# Patient Record
Sex: Male | Born: 1960
Health system: Southern US, Community
[De-identification: ages and names within clinical notes are randomized; demographics above are authoritative.]

## PROBLEM LIST (undated history)

## (undated) DIAGNOSIS — R51 Headache: Secondary | ICD-10-CM

## (undated) DIAGNOSIS — M329 Systemic lupus erythematosus, unspecified: Secondary | ICD-10-CM

## (undated) DIAGNOSIS — Z9889 Other specified postprocedural states: Secondary | ICD-10-CM

## (undated) DIAGNOSIS — R519 Headache, unspecified: Secondary | ICD-10-CM

## (undated) DIAGNOSIS — M359 Systemic involvement of connective tissue, unspecified: Secondary | ICD-10-CM

## (undated) DIAGNOSIS — T8203XA Leakage of heart valve prosthesis, initial encounter: Secondary | ICD-10-CM

## (undated) DIAGNOSIS — R5383 Other fatigue: Secondary | ICD-10-CM

## (undated) DIAGNOSIS — M1990 Unspecified osteoarthritis, unspecified site: Secondary | ICD-10-CM

## (undated) DIAGNOSIS — I350 Nonrheumatic aortic (valve) stenosis: Secondary | ICD-10-CM

## (undated) DIAGNOSIS — L409 Psoriasis, unspecified: Secondary | ICD-10-CM

## (undated) DIAGNOSIS — Z973 Presence of spectacles and contact lenses: Secondary | ICD-10-CM

## (undated) DIAGNOSIS — D649 Anemia, unspecified: Secondary | ICD-10-CM

## (undated) DIAGNOSIS — R112 Nausea with vomiting, unspecified: Secondary | ICD-10-CM

## (undated) HISTORY — DX: Systemic involvement of connective tissue, unspecified: M35.9

## (undated) HISTORY — PX: CARDIAC CATHETERIZATION: SHX172

---

## 2012-12-13 ENCOUNTER — Ambulatory Visit (INDEPENDENT_AMBULATORY_CARE_PROVIDER_SITE_OTHER): Payer: 59 | Admitting: General Surgery

## 2012-12-13 ENCOUNTER — Encounter (INDEPENDENT_AMBULATORY_CARE_PROVIDER_SITE_OTHER): Payer: Self-pay | Admitting: General Surgery

## 2012-12-13 VITALS — BP 152/90 | HR 64 | Temp 97.3°F | Resp 12 | Ht 68.0 in | Wt 168.0 lb

## 2012-12-13 DIAGNOSIS — K4090 Unilateral inguinal hernia, without obstruction or gangrene, not specified as recurrent: Secondary | ICD-10-CM

## 2012-12-13 NOTE — Progress Notes (Signed)
Patient ID: Luis Ferguson, male   DOB: 1961/06/09, 52 y.o.   MRN: [id number]  Chief Complaint  Patient presents with  . New Evaluation    eval RIH    HPI Luis Ferguson is a 52 y.o. male.  The patient is a 52 year old male who is referred by [place] fecal physicians for evaluation of a right inguinal hernia. The patient states that the hernias been there for approximately 2 years. He states he has a burning sensation when lifting heavy objects. [name] job entails working with [employer]. HPI  Past Medical History  Diagnosis Date  . Connective tissue disease     History reviewed. No pertinent past surgical history.  Family History  Problem Relation Age of Onset  . Diabetes Mother   . Cancer Brother     liver  . Diabetes Brother     Social History History  Substance Use Topics  . Smoking status: Never Smoker   . Smokeless tobacco: Never Used  . Alcohol Use: No    No Known Allergies  Current Outpatient Prescriptions  Medication Sig Dispense Refill  . aspirin 81 MG tablet Take 81 mg by mouth daily.      [name] atorvastatin (LIPITOR) 20 MG tablet       . Cholecalciferol (VITAMIN D PO) Take by mouth.      . fish oil-omega-3 fatty acids 1000 MG capsule Take 2 g by mouth daily.      . methylPREDNISolone (MEDROL) 4 MG tablet       . Multiple Vitamins-Minerals (ONE-A-DAY 50 PLUS PO) Take by mouth.      . tamsulosin (FLOMAX) 0.4 MG CAPS        No current facility-administered medications for this visit.    Review of Systems Review of Systems  Constitutional: Negative.   HENT: Negative.   Respiratory: Negative.   Cardiovascular: Negative.   Gastrointestinal: Negative.   Neurological: Negative.     Blood pressure 152/90, pulse 64, temperature 97.3 F (36.3 C), temperature source Temporal, resp. rate 12, height 5\' 8"  (1.727 m), weight 168 lb (76.204 kg).  Physical Exam Physical Exam  Constitutional: He is oriented to person, place, and time. He appears  well-developed and well-nourished.  HENT:  Head: Normocephalic and atraumatic.  Eyes: Conjunctivae and EOM are normal. Pupils are equal, round, and reactive to light.  Neck: Normal range of motion. Neck supple.  Cardiovascular: Normal rate, regular rhythm and normal heart sounds.   Pulmonary/Chest: Effort normal and breath sounds normal.  Abdominal: Soft. Bowel sounds are normal. A hernia is present. Hernia confirmed positive in the right inguinal area.  Questionable left inguinal hernia  Musculoskeletal: Normal range of motion.  Neurological: He is alert and oriented to person, place, and time.    Data Reviewed none  Assessment    52 year old male with a right direct inguinal hernia, and questionable left direct inguinal hernia,     Plan    1. Will proceed to the operating room for laparoscopic right inguinal hernia repair, & explore the left hernia area.  2. I discussed the procedure in length, and give the patient literature on the procedure. All risks and benefits were discussed with the patient, to generally include infection, bleeding, damage to surrounding structures, acute and chronic nerve pain, and recurrence. Alternatives were offered and described.  All questions were answered and the patient voiced understanding of the procedure and wishes to proceed at this point.        Marigene Ehlers.,  Karry Barrilleaux 12/13/2012, 2:44 PM

## 2012-12-16 ENCOUNTER — Ambulatory Visit (INDEPENDENT_AMBULATORY_CARE_PROVIDER_SITE_OTHER): Payer: Self-pay | Admitting: General Surgery

## 2013-02-17 DIAGNOSIS — K4020 Bilateral inguinal hernia, without obstruction or gangrene, not specified as recurrent: Secondary | ICD-10-CM

## 2013-02-20 ENCOUNTER — Encounter (INDEPENDENT_AMBULATORY_CARE_PROVIDER_SITE_OTHER): Payer: Self-pay | Admitting: General Surgery

## 2013-03-07 ENCOUNTER — Encounter (INDEPENDENT_AMBULATORY_CARE_PROVIDER_SITE_OTHER): Payer: Self-pay | Admitting: General Surgery

## 2013-03-07 ENCOUNTER — Ambulatory Visit (INDEPENDENT_AMBULATORY_CARE_PROVIDER_SITE_OTHER): Payer: 59 | Admitting: General Surgery

## 2013-03-07 VITALS — BP 130/82 | HR 66 | Temp 98.1°F | Resp 14 | Ht 68.0 in | Wt 168.2 lb

## 2013-03-07 DIAGNOSIS — Z8719 Personal history of other diseases of the digestive system: Secondary | ICD-10-CM

## 2013-03-07 DIAGNOSIS — Z9889 Other specified postprocedural states: Secondary | ICD-10-CM

## 2013-03-07 NOTE — Progress Notes (Signed)
Patient ID: Luis Ferguson, male   DOB: Nov 30, 1960, 52 y.o.   MRN: [id number] The patient is a 52 year old male status post bilateral inguinal hernia repair, laparoscopically, status post 2 weeks. Patient has been doing well and is returned to work. He's had no pain and no symptoms at this time.  On exam: Wounds are clean dry and intact there is no hernia on palpation bilaterally  Assessment and plan: This 52 year old male status post lap bilateral inguinal hernia repair with mesh 1. The patient can continue this time with any aerobic activities. We discussed no heavy lifting for another 4 weeks. 2. Patient follow up as needed

## 2014-07-27 HISTORY — PX: HERNIA REPAIR: SHX51

## 2014-08-04 ENCOUNTER — Ambulatory Visit: Payer: Self-pay

## 2015-01-09 ENCOUNTER — Ambulatory Visit (HOSPITAL_COMMUNITY): Payer: 59 | Attending: Cardiology

## 2015-01-09 ENCOUNTER — Other Ambulatory Visit: Payer: Self-pay | Admitting: Physician Assistant

## 2015-01-09 ENCOUNTER — Other Ambulatory Visit: Payer: Self-pay

## 2015-01-09 DIAGNOSIS — I350 Nonrheumatic aortic (valve) stenosis: Secondary | ICD-10-CM | POA: Diagnosis not present

## 2015-01-09 DIAGNOSIS — R011 Cardiac murmur, unspecified: Secondary | ICD-10-CM | POA: Diagnosis not present

## 2015-01-09 DIAGNOSIS — I358 Other nonrheumatic aortic valve disorders: Secondary | ICD-10-CM | POA: Diagnosis not present

## 2015-01-09 DIAGNOSIS — I351 Nonrheumatic aortic (valve) insufficiency: Secondary | ICD-10-CM | POA: Diagnosis not present

## 2015-01-09 DIAGNOSIS — I340 Nonrheumatic mitral (valve) insufficiency: Secondary | ICD-10-CM | POA: Diagnosis not present

## 2015-01-14 ENCOUNTER — Telehealth: Payer: Self-pay | Admitting: Internal Medicine

## 2015-01-14 NOTE — Telephone Encounter (Signed)
Received records from [facility] for appointment on 01/21/15 with Dr Hilty.  Records given to N Hines (medical records) for Dr Hilty's schedule on 01/21/15. lp °

## 2015-01-21 ENCOUNTER — Encounter: Payer: Self-pay | Admitting: Internal Medicine

## 2015-01-21 ENCOUNTER — Ambulatory Visit (INDEPENDENT_AMBULATORY_CARE_PROVIDER_SITE_OTHER): Payer: 59 | Admitting: Internal Medicine

## 2015-01-21 VITALS — BP 160/92 | HR 81 | Ht 67.5 in | Wt 168.0 lb

## 2015-01-21 DIAGNOSIS — I350 Nonrheumatic aortic (valve) stenosis: Secondary | ICD-10-CM | POA: Diagnosis not present

## 2015-01-21 DIAGNOSIS — Q231 Congenital insufficiency of aortic valve: Secondary | ICD-10-CM

## 2015-01-21 DIAGNOSIS — M329 Systemic lupus erythematosus, unspecified: Secondary | ICD-10-CM | POA: Diagnosis not present

## 2015-01-21 DIAGNOSIS — E785 Hyperlipidemia, unspecified: Secondary | ICD-10-CM | POA: Diagnosis not present

## 2015-01-21 NOTE — Patient Instructions (Signed)
Your physician wants you to follow-up in: 6 months with Dr. Hilty. You will receive a reminder letter in the mail two months in advance. If you don't receive a letter, please call our office to schedule the follow-up appointment.    

## 2015-01-22 DIAGNOSIS — I350 Nonrheumatic aortic (valve) stenosis: Secondary | ICD-10-CM | POA: Insufficient documentation

## 2015-01-22 DIAGNOSIS — M329 Systemic lupus erythematosus, unspecified: Secondary | ICD-10-CM | POA: Insufficient documentation

## 2015-01-22 DIAGNOSIS — Q231 Congenital insufficiency of aortic valve: Secondary | ICD-10-CM | POA: Insufficient documentation

## 2015-01-22 DIAGNOSIS — E785 Hyperlipidemia, unspecified: Secondary | ICD-10-CM | POA: Insufficient documentation

## 2015-01-22 NOTE — Progress Notes (Signed)
OFFICE NOTE  Chief Complaint:  Abnormal echocardiogram, murmur  Primary Care Physician: REDMON,NOELLE, PA-C  HPI:  Luis Ferguson is a pleasant 54 year old male who is coming referred to me for an aortic murmur. He underwent an echocardiogram recently which demonstrated an EF of 55-60%, moderate calcific aortic valve stenosis without mention of a bicuspid valve or not, and moderate regurgitation. He apparently was told he had a heart murmur when he was child but this was not followed up on. He denies any chest pain or worsening shortness of breath. He does have a history of dyslipidemia is well and was diagnosed and is on treatment for lupus. There is no family history of congenital heart disease to his knowledge.  PMHx:  Past Medical History  Diagnosis Date  . Connective tissue disease     Past Surgical History  Procedure Laterality Date  . Hernia repair      left and right    FAMHx:  Family History  Problem Relation Age of Onset  . Diabetes Mother     also HTN  . Cancer Brother     liver  . Diabetes Brother   . CAD Father 50    CABGx3    SOCHx:   reports that he has never smoked. He has never used smokeless tobacco. He reports that he does not drink alcohol or use illicit drugs.  ALLERGIES:  Allergies  Allergen Reactions  . Codeine Itching and Rash    ROS: A comprehensive review of systems was negative.  HOME MEDS: Current Outpatient Prescriptions  Medication Sig Dispense Refill  . aspirin 81 MG tablet Take 81 mg by mouth daily.    [name] atorvastatin (LIPITOR) 20 MG tablet Take 20 mg by mouth daily.     . cholecalciferol (VITAMIN D) 1000 UNITS tablet Take 1,000 Units by mouth daily.    . fish oil-omega-3 fatty acids 1000 MG capsule Take 2 g by mouth daily.    . hydroxychloroquine (PLAQUENIL) 200 MG tablet Take 400 mg by mouth daily.     . Multiple Vitamins-Minerals (ONE-A-DAY 50 PLUS PO) Take by mouth.    . tamsulosin (FLOMAX) 0.4 MG CAPS Take 0.4 mg by  mouth daily.      No current facility-administered medications for this visit.    LABS/IMAGING: No results found for this or any previous visit (from the past 48 hour(s)). No results found.  WEIGHTS: Wt Readings from Last 3 Encounters:  01/21/15 168 lb (76.204 kg)  03/07/13 168 lb 3.2 oz (76.295 kg)  12/13/12 168 lb (76.204 kg)    VITALS: BP 160/92 mmHg  Pulse 81  Ht 5' 7.5" (1.715 m)  Wt 168 lb (76.204 kg)  BMI 25.91 kg/m2  EXAM: General appearance: alert and no distress Neck: no carotid bruit, no JVD and thyroid not enlarged, symmetric, no tenderness/mass/nodules Lungs: clear to auscultation bilaterally Heart: regular rate and rhythm, S1, S2 normal and systolic murmur: Mid peaking 3/6, blowing at apex Abdomen: soft, non-tender; bowel sounds normal; no masses,  no organomegaly Extremities: extremities normal, atraumatic, no cyanosis or edema Pulses: 2+ and symmetric Skin: Skin color, texture, turgor normal. No rashes or lesions Neurologic: Grossly normal Psych: Pleasant  EKG: Normal sinus rhythm at 81  ASSESSMENT: 1. Moderate calcific aortic valve stenosis, a bicuspid valve cannot be ruled out 2. Moderate aortic insufficiency 3. Dyslipidemia 4. Lupus/MCTD  PLAN: 1.   Mr. Zechman most likely has a bicuspid aortic valve which is calcified. Given his young age it is  likely that this valve will need to be replaced within the next few years. He will need close follow-up. At this time there is no clear indication for intervention. He is completely asymptomatic. His EKG is normal at rest. He is on good medical therapy including a cholesterol medication. I recommend follow-up in 6 months. We will get another echocardiogram in 6 months to one year to look for stability of his aortic valve. We had an extensive discussion in the office about aortic stenosis and the possibility of a bicuspid valve. At some point, would be helpful to get a CT of the aorta as bicuspid valves are  associated with aortic coarctation about 20% of the time.  Thank you for the kind referral. I'm happy to follow him going forward.  Chrystie Nose, MD, [facility] Attending Cardiologist [facility]  Lisette Abu [facility] 01/22/2015, 6:42 PM

## 2015-01-24 ENCOUNTER — Encounter: Payer: Self-pay | Admitting: *Deleted

## 2015-07-15 ENCOUNTER — Encounter: Payer: Self-pay | Admitting: Internal Medicine

## 2015-07-15 ENCOUNTER — Ambulatory Visit (INDEPENDENT_AMBULATORY_CARE_PROVIDER_SITE_OTHER): Payer: Commercial Managed Care - PPO | Admitting: Internal Medicine

## 2015-07-15 VITALS — BP 158/90 | HR 73 | Ht 66.0 in | Wt 166.0 lb

## 2015-07-15 DIAGNOSIS — Q231 Congenital insufficiency of aortic valve: Secondary | ICD-10-CM | POA: Diagnosis not present

## 2015-07-15 DIAGNOSIS — I350 Nonrheumatic aortic (valve) stenosis: Secondary | ICD-10-CM

## 2015-07-15 DIAGNOSIS — E785 Hyperlipidemia, unspecified: Secondary | ICD-10-CM

## 2015-07-15 NOTE — Progress Notes (Signed)
OFFICE NOTE  Chief Complaint:  Abnormal echocardiogram, murmur  Primary Care Physician: REDMON,NOELLE, PA-C  HPI:  Luis Ferguson is a pleasant 54 year old male who is coming referred to me for an aortic murmur. He underwent an echocardiogram recently which demonstrated an EF of 55-60%, moderate calcific aortic valve stenosis without mention of a bicuspid valve or not, and moderate regurgitation. He apparently was told he had a heart murmur when he was child but this was not followed up on. He denies any chest pain or worsening shortness of breath. He does have a history of dyslipidemia is well and was diagnosed and is on treatment for lupus. There is no family history of congenital heart disease to his knowledge.  Luis Ferguson returns today for follow-up. Overall he remains asymptomatic. He denies any chest pain or worsening shortness of breath.  PMHx:  Past Medical History  Diagnosis Date  . Connective tissue disease [facility])     Past Surgical History  Procedure Laterality Date  . Hernia repair      left and right    FAMHx:  Family History  Problem Relation Age of Onset  . Diabetes Mother     also HTN  . Liver cancer Brother   . Diabetes Brother   . CAD Father 46    CABGx3    SOCHx:   reports that he has never smoked. He has never used smokeless tobacco. He reports that he does not drink alcohol or use illicit drugs.  ALLERGIES:  Allergies  Allergen Reactions  . Codeine Itching and Rash    ROS: A comprehensive review of systems was negative.  HOME MEDS: Current Outpatient Prescriptions  Medication Sig Dispense Refill  . aspirin 81 MG tablet Take 81 mg by mouth daily.    [name] atorvastatin (LIPITOR) 20 MG tablet Take 20 mg by mouth daily.     . cholecalciferol (VITAMIN D) 1000 UNITS tablet Take 1,000 Units by mouth daily.    . fish oil-omega-3 fatty acids 1000 MG capsule Take 2 g by mouth daily.    . hydroxychloroquine (PLAQUENIL) 200 MG tablet Take 400 mg by  mouth daily.     . Multiple Vitamins-Minerals (ONE-A-DAY 50 PLUS PO) Take by mouth.    . tamsulosin (FLOMAX) 0.4 MG CAPS Take 0.4 mg by mouth daily.      No current facility-administered medications for this visit.    LABS/IMAGING: No results found for this or any previous visit (from the past 48 hour(s)). No results found.  WEIGHTS: Wt Readings from Last 3 Encounters:  07/15/15 166 lb (75.297 kg)  01/21/15 168 lb (76.204 kg)  03/07/13 168 lb 3.2 oz (76.295 kg)    VITALS: BP 158/90 mmHg  Pulse 73  Ht  (1.676 m)  Wt 166 lb (75.297 kg)  BMI 26.81 kg/m2  EXAM: General appearance: alert and no distress Neck: no carotid bruit, no JVD and thyroid not enlarged, symmetric, no tenderness/mass/nodules Lungs: clear to auscultation bilaterally Heart: regular rate and rhythm, S1, S2 normal and systolic murmur: Mid peaking 3/6, blowing at apex Abdomen: soft, non-tender; bowel sounds normal; no masses,  no organomegaly Extremities: extremities normal, atraumatic, no cyanosis or edema Pulses: 2+ and symmetric Skin: Skin color, texture, turgor normal. No rashes or lesions Neurologic: Grossly normal Psych: Pleasant  EKG: Normal sinus rhythm at 73  ASSESSMENT: 1. Moderate calcific aortic valve stenosis, a bicuspid valve cannot be ruled out 2. Moderate aortic insufficiency 3. Dyslipidemia 4. Lupus/MCTD  PLAN: 1.  Luis Ferguson has a stable murmur of moderate aortic stenosis and moderate aortic insufficiency. He will need close follow-up and a repeat echocardiogram in 6 months (a one-year study). I told them that there is a likely possibility his aortic valve may need to be replaced within the next 4-5 years. Fortunately he continues to be active and that will be helpful to identify his symptoms.   Plan to see him back in 6 months following his echo to go over the results.   Chrystie Nose, MD, [facility] Attending Cardiologist [facility]  Chrystie Nose 07/15/2015, 5:29  PM

## 2015-07-15 NOTE — Patient Instructions (Signed)
Your physician has requested that you have an echocardiogram in 6 months. Echocardiography is a painless test that uses sound waves to create images of your heart. It provides your doctor with information about the size and shape of your heart and how well your heart's chambers and valves are working. This procedure takes approximately one hour. There are no restrictions for this procedure.  Dr Rennis Golden recommends that you schedule a follow-up appointment in 6 months - after your echocardiogram. You will receive a reminder letter in the mail two months in advance. If you don't receive a letter, please call our office to schedule the follow-up appointment.  If you need a refill on your cardiac medications before your next appointment, please call your pharmacy.

## 2016-01-06 ENCOUNTER — Ambulatory Visit (HOSPITAL_COMMUNITY): Payer: Commercial Managed Care - PPO | Attending: Cardiovascular Disease

## 2016-01-06 ENCOUNTER — Other Ambulatory Visit: Payer: Self-pay

## 2016-01-06 DIAGNOSIS — Z8249 Family history of ischemic heart disease and other diseases of the circulatory system: Secondary | ICD-10-CM | POA: Diagnosis not present

## 2016-01-06 DIAGNOSIS — I350 Nonrheumatic aortic (valve) stenosis: Secondary | ICD-10-CM | POA: Diagnosis not present

## 2016-01-06 DIAGNOSIS — I517 Cardiomegaly: Secondary | ICD-10-CM | POA: Diagnosis not present

## 2016-01-06 DIAGNOSIS — E785 Hyperlipidemia, unspecified: Secondary | ICD-10-CM | POA: Diagnosis not present

## 2016-01-06 DIAGNOSIS — I352 Nonrheumatic aortic (valve) stenosis with insufficiency: Secondary | ICD-10-CM | POA: Diagnosis not present

## 2016-01-06 LAB — ECHOCARDIOGRAM COMPLETE
AO mean calculated velocity dopler: 219 cm/s
AOASC: 34 cm
AOPV: 0.34 m/s
AOVTI: 78.4 cm
AV Area VTI index: 0.79 cm2/m2
AV Area VTI: 1.42 cm2
AV Area mean vel: 1.5 cm2
AV Mean grad: 23 mmHg
AV Peak grad: 46 mmHg
AV VEL mean LVOT/AV: 0.36
AV peak Index: 0.75
AV pk vel: 338 cm/s
AV vel: 1.49
AVAREAMEANVIN: 0.79 cm2/m2
AVPHT: 640 ms
CHL CUP AV VALUE AREA INDEX: 0.79
CHL CUP DOP CALC LVOT VTI: 28.1 cm
E decel time: 173 msec
EERAT: 6.57
FS: 36 % (ref 28–44)
IVS/LV PW RATIO, ED: 1.15
LA diam end sys: 46 mm
LA vol: 41.3 mL
LADIAMINDEX: 2.44 cm/m2
LASIZE: 46 mm
LAVOLA4C: 39.7 mL
LAVOLIN: 21.9 mL/m2
LV E/e'average: 6.57
LV PW d: 12 mm — AB (ref 0.6–1.1)
LVEEMED: 6.57
LVELAT: 14.1 cm/s
LVOT SV: 117 mL
LVOT area: 4.15 cm2
LVOT diameter: 23 mm
LVOT peak VTI: 0.36 cm
LVOT peak grad rest: 5 mmHg
LVOTPV: 116 cm/s
MV Dec: 173
MV Peak grad: 3 mmHg
MV pk A vel: 77.6 m/s
MV pk E vel: 92.6 m/s
Reg peak vel: 235 cm/s
TAPSE: 24.8 mm
TDI e' lateral: 14.1
TDI e' medial: 9.79
TRMAXVEL: 235 cm/s
Valve area: 1.49 cm2

## 2016-01-08 ENCOUNTER — Ambulatory Visit (INDEPENDENT_AMBULATORY_CARE_PROVIDER_SITE_OTHER): Payer: Commercial Managed Care - PPO | Admitting: Internal Medicine

## 2016-01-08 ENCOUNTER — Encounter: Payer: Self-pay | Admitting: Internal Medicine

## 2016-01-08 VITALS — BP 141/91 | HR 69 | Ht 66.5 in | Wt 162.2 lb

## 2016-01-08 DIAGNOSIS — I350 Nonrheumatic aortic (valve) stenosis: Secondary | ICD-10-CM

## 2016-01-08 DIAGNOSIS — Q231 Congenital insufficiency of aortic valve: Secondary | ICD-10-CM | POA: Diagnosis not present

## 2016-01-08 DIAGNOSIS — E785 Hyperlipidemia, unspecified: Secondary | ICD-10-CM | POA: Diagnosis not present

## 2016-01-08 NOTE — Patient Instructions (Signed)
Your physician has requested that you have an echocardiogram @ [address] in 1 year. Echocardiography is a painless test that uses sound waves to create images of your heart. It provides your doctor with information about the size and shape of your heart and how well your heart's chambers and valves are working. This procedure takes approximately one hour. There are no restrictions for this procedure.  Your physician wants you to follow-up in: 12 months with Dr. Rennis Golden (after echo) You will receive a reminder letter in the mail two months in advance. If you don't receive a letter, please call our office to schedule the follow-up appointment.

## 2016-01-08 NOTE — Progress Notes (Signed)
OFFICE NOTE  Chief Complaint:  Abnormal echocardiogram, murmur  Primary Care Physician: Ferguson,NOELLE, PA-C  HPI:  Luis Ferguson is a pleasant 55 year old male who is coming referred to me for an aortic murmur. He underwent an echocardiogram recently which demonstrated an EF of 55-60%, moderate calcific aortic valve stenosis without mention of a bicuspid valve or not, and moderate regurgitation. He apparently was told he had a heart murmur when he was child but this was not followed up on. He denies any chest pain or worsening shortness of breath. He does have a history of dyslipidemia is well and was diagnosed and is on treatment for lupus. There is no family history of congenital heart disease to his knowledge.  01/08/2016  Luis Ferguson returns today for follow-up. Overall he remains asymptomatic. He denies any chest pain or worsening shortness of breath. A repeat echocardiogram was performed which shows stable aortic stenosis and insufficiency. EF remains preserved at 55-60%. The left atrium was moderately dilated.  PMHx:  Past Medical History  Diagnosis Date  . Connective tissue disease [facility])     Past Surgical History  Procedure Laterality Date  . Hernia repair      left and right    FAMHx:  Family History  Problem Relation Age of Onset  . Diabetes Mother     also HTN  . Liver cancer Brother   . Diabetes Brother   . CAD Father 81    CABGx3    SOCHx:   reports that he has never smoked. He has never used smokeless tobacco. He reports that he does not drink alcohol or use illicit drugs.  ALLERGIES:  Allergies  Allergen Reactions  . Codeine Itching and Rash    ROS: A comprehensive review of systems was negative.  HOME MEDS: Current Outpatient Prescriptions  Medication Sig Dispense Refill  . aspirin 81 MG tablet Take 81 mg by mouth daily.    [name] atorvastatin (LIPITOR) 20 MG tablet Take 20 mg by mouth daily.     . cholecalciferol (VITAMIN D) 1000 UNITS tablet  Take 1,000 Units by mouth daily.    . fish oil-omega-3 fatty acids 1000 MG capsule Take 2 g by mouth daily.    . hydroxychloroquine (PLAQUENIL) 200 MG tablet Take 400 mg by mouth daily.     . Multiple Vitamins-Minerals (ONE-A-DAY 50 PLUS PO) Take by mouth.    . predniSONE (DELTASONE) 5 MG tablet Take 5 mg by mouth daily with breakfast.    . tamsulosin (FLOMAX) 0.4 MG CAPS Take 0.4 mg by mouth daily.      No current facility-administered medications for this visit.    LABS/IMAGING: No results found for this or any previous visit (from the past 48 hour(s)). No results found.  WEIGHTS: Wt Readings from Last 3 Encounters:  01/08/16 162 lb 3.2 oz (73.573 kg)  07/15/15 166 lb (75.297 kg)  01/21/15 168 lb (76.204 kg)    VITALS: BP 141/91 mmHg  Pulse 69  Ht 5' 6.5" (1.689 m)  Wt 162 lb 3.2 oz (73.573 kg)  BMI 25.79 kg/m2  EXAM: General appearance: alert and no distress Neck: no carotid bruit, no JVD and thyroid not enlarged, symmetric, no tenderness/mass/nodules Lungs: clear to auscultation bilaterally Heart: regular rate and rhythm, S1, S2 normal and systolic murmur: Mid peaking 3/6, blowing at apex Abdomen: soft, non-tender; bowel sounds normal; no masses,  no organomegaly Extremities: extremities normal, atraumatic, no cyanosis or edema Pulses: 2+ and symmetric Skin: Skin color, texture, turgor  normal. No rashes or lesions Neurologic: Grossly normal Psych: Pleasant  EKG: Normal sinus rhythm at 64  ASSESSMENT: 1. Moderate calcific aortic valve stenosis, a bicuspid valve cannot be ruled out 2. Moderate aortic insufficiency 3. Dyslipidemia 4. Lupus/MCTD  PLAN: 1.   Luis Ferguson has a stable murmur of moderate aortic stenosis and moderate aortic insufficiency. This appears to be stable by echo over the past year. Based on those findings of think we can safely see him back in one year and continue to monitor him clinically. Blood pressure is mildly elevated today however  recheck came back at 130/82. He is on cholesterol medication and recently has had good control over his cholesterol.  Follow-up with me in one year or sooner as necessary.  Chrystie Nose, MD, [facility] Attending Cardiologist [facility]  Chrystie Nose 01/08/2016, 5:07 PM

## 2016-08-11 DIAGNOSIS — Z79899 Other long term (current) drug therapy: Secondary | ICD-10-CM | POA: Diagnosis not present

## 2016-08-11 DIAGNOSIS — H40013 Open angle with borderline findings, low risk, bilateral: Secondary | ICD-10-CM | POA: Diagnosis not present

## 2016-08-11 DIAGNOSIS — M329 Systemic lupus erythematosus, unspecified: Secondary | ICD-10-CM | POA: Diagnosis not present

## 2016-08-31 DIAGNOSIS — L400 Psoriasis vulgaris: Secondary | ICD-10-CM | POA: Diagnosis not present

## 2016-09-03 ENCOUNTER — Telehealth (HOSPITAL_COMMUNITY): Payer: Self-pay | Admitting: Radiology

## 2016-09-03 NOTE — Telephone Encounter (Signed)
called to schedule echo due in June

## 2016-10-12 DIAGNOSIS — L400 Psoriasis vulgaris: Secondary | ICD-10-CM | POA: Diagnosis not present

## 2016-12-04 DIAGNOSIS — I10 Essential (primary) hypertension: Secondary | ICD-10-CM | POA: Diagnosis not present

## 2016-12-04 DIAGNOSIS — M7021 Olecranon bursitis, right elbow: Secondary | ICD-10-CM | POA: Diagnosis not present

## 2016-12-06 DIAGNOSIS — M7021 Olecranon bursitis, right elbow: Secondary | ICD-10-CM | POA: Diagnosis not present

## 2016-12-06 DIAGNOSIS — I10 Essential (primary) hypertension: Secondary | ICD-10-CM | POA: Diagnosis not present

## 2016-12-07 ENCOUNTER — Telehealth (HOSPITAL_COMMUNITY): Payer: Self-pay | Admitting: Internal Medicine

## 2016-12-07 NOTE — Telephone Encounter (Signed)
New message      Calling to see if pt needs to have an echo this year.  There is no order in the computer and he is seeing Dr Rennis Golden 01-15-17. Please call

## 2016-12-07 NOTE — Telephone Encounter (Signed)
Due in June, please call pt and schedule appt

## 2017-01-05 DIAGNOSIS — E7800 Pure hypercholesterolemia, unspecified: Secondary | ICD-10-CM | POA: Diagnosis not present

## 2017-01-05 DIAGNOSIS — N400 Enlarged prostate without lower urinary tract symptoms: Secondary | ICD-10-CM | POA: Diagnosis not present

## 2017-01-11 ENCOUNTER — Encounter: Payer: Self-pay | Admitting: *Deleted

## 2017-01-11 ENCOUNTER — Ambulatory Visit (HOSPITAL_COMMUNITY): Payer: Commercial Managed Care - PPO | Attending: Cardiology

## 2017-01-11 DIAGNOSIS — I350 Nonrheumatic aortic (valve) stenosis: Secondary | ICD-10-CM | POA: Diagnosis not present

## 2017-01-11 DIAGNOSIS — I352 Nonrheumatic aortic (valve) stenosis with insufficiency: Secondary | ICD-10-CM | POA: Diagnosis not present

## 2017-01-11 DIAGNOSIS — I517 Cardiomegaly: Secondary | ICD-10-CM | POA: Diagnosis not present

## 2017-01-15 ENCOUNTER — Encounter: Payer: Self-pay | Admitting: Internal Medicine

## 2017-01-15 ENCOUNTER — Ambulatory Visit (INDEPENDENT_AMBULATORY_CARE_PROVIDER_SITE_OTHER): Payer: Commercial Managed Care - PPO | Admitting: Internal Medicine

## 2017-01-15 VITALS — BP 138/86 | HR 64 | Ht 66.0 in | Wt 162.8 lb

## 2017-01-15 DIAGNOSIS — Q231 Congenital insufficiency of aortic valve: Secondary | ICD-10-CM

## 2017-01-15 DIAGNOSIS — I351 Nonrheumatic aortic (valve) insufficiency: Secondary | ICD-10-CM | POA: Insufficient documentation

## 2017-01-15 DIAGNOSIS — I350 Nonrheumatic aortic (valve) stenosis: Secondary | ICD-10-CM | POA: Diagnosis not present

## 2017-01-15 NOTE — Patient Instructions (Signed)
Your physician wants you to follow-up in: November with Dr. Rennis Golden. You will receive a reminder letter in the mail two months in advance. If you don't receive a letter, please call our office to schedule the follow-up appointment.  Your physician has requested that you have an echocardiogram @ [address] in November 2018. Echocardiography is a painless test that uses sound waves to create images of your heart. It provides your doctor with information about the size and shape of your heart and how well your heart's chambers and valves are working. This procedure takes approximately one hour. There are no restrictions for this procedure.

## 2017-01-15 NOTE — Progress Notes (Signed)
OFFICE NOTE  Chief Complaint:  Abnormal echocardiogram, murmur  Primary Care Physician: Milus Height, PA-C  HPI:  Luis Ferguson is a pleasant 56 year old male who is coming referred to me for an aortic murmur. He underwent an echocardiogram recently which demonstrated an EF of 55-60%, moderate calcific aortic valve stenosis without mention of a bicuspid valve or not, and moderate regurgitation. He apparently was told he had a heart murmur when he was child but this was not followed up on. He denies any chest pain or worsening shortness of breath. He does have a history of dyslipidemia is well and was diagnosed and is on treatment for lupus. There is no family history of congenital heart disease to his knowledge.  01/08/2016  Luis Ferguson returns today for follow-up. Overall he remains asymptomatic. He denies any chest pain or worsening shortness of breath. A repeat echocardiogram was performed which shows stable aortic stenosis and insufficiency. EF remains preserved at 55-60%. The left atrium was moderately dilated.  01/15/2017  Luis Ferguson returns today for follow-up. He recently had a repeat echo which now shows severe aortic stenosis. The aortic valve severely calcified with restricted leaflet motion. There was moderate regurgitation. The mean gradient was 47 mmHg. Left atrium was mildly dilated and LVEF was normal. I discussed the findings with him today and he reports he is for the most part asymptomatic. He works about 10 hours a day and when he comes home and goes and mows the lawn. 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 He reports lifting and being very active without any chest pain, shortness of breath, presyncope, syncope or other associated symptoms. Although he meets criteria by the echo for valve replacement, since he is asymptomatic we could consider watchful waiting at this time.  PMHx:  Past Medical History:  Diagnosis Date  .  Connective tissue disease [facility])     Past Surgical History:  Procedure Laterality Date  . HERNIA REPAIR     left and right    FAMHx:  Family History  Problem Relation Age of Onset  . Diabetes Mother        also HTN  . Liver cancer Brother   . Diabetes Brother   . CAD Father 103       CABGx3    SOCHx:   reports that he has never smoked. He has never used smokeless tobacco. He reports that he does not drink alcohol or use drugs.  ALLERGIES:  Allergies  Allergen Reactions  . Codeine Itching and Rash    ROS: Pertinent items noted in HPI and remainder of comprehensive ROS otherwise negative.  HOME MEDS: Current Outpatient Prescriptions  Medication Sig Dispense Refill  . aspirin 81 MG tablet Take 81 mg by mouth daily.    [name] atorvastatin (LIPITOR) 20 MG tablet Take 20 mg by mouth daily.     . cholecalciferol (VITAMIN D) 1000 UNITS tablet Take 1,000 Units by mouth daily.    . fish oil-omega-3 fatty acids 1000 MG capsule Take 2 g by mouth daily.    . hydroxychloroquine (PLAQUENIL) 200 MG tablet Take 400 mg by mouth daily.     . Multiple Vitamins-Minerals (ONE-A-DAY 50 PLUS PO) Take by mouth.    . predniSONE (DELTASONE) 5 MG tablet Take 5 mg by mouth daily with breakfast.    . tamsulosin (FLOMAX) 0.4 MG CAPS Take 0.4 mg by mouth daily.      No current facility-administered medications for this  visit.     LABS/IMAGING: No results found for this or any previous visit (from the past 48 hour(s)). No results found.  WEIGHTS: Wt Readings from Last 3 Encounters:  01/15/17 162 lb 12.8 oz (73.8 kg)  01/08/16 162 lb 3.2 oz (73.6 kg)  07/15/15 166 lb (75.3 kg)    VITALS: BP 138/86   Pulse 64   Ht 5\' 6"  (1.676 m)   Wt 162 lb 12.8 oz (73.8 kg)   BMI 26.28 kg/m   EXAM: General appearance: alert and no distress Neck: no carotid bruit, no JVD and thyroid not enlarged, symmetric, no tenderness/mass/nodules Lungs: clear to auscultation bilaterally Heart: regular rate and  rhythm, S1: normal, S2: decreased intensity and systolic murmur: late systolic 3/6, crescendo at 2nd right intercostal space Abdomen: soft, non-tender; bowel sounds normal; no masses,  no organomegaly Extremities: extremities normal, atraumatic, no cyanosis or edema Pulses: 2+ and symmetric Skin: Skin color, texture, turgor normal. No rashes or lesions Neurologic: Grossly normal Psych: Pleasant  EKG: Normal sinus rhythm at 64, nonspecific T wave changes  ASSESSMENT: 1. Severe calcific aortic valve stenosis, suspect bicuspid valve 2. Moderate aortic insufficiency 3. Dyslipidemia 4. Lupus/MCTD  PLAN: 1.   Mr. Weatherbee now has severe calcific aortic valve stenosis and a probable bicuspid aortic valve. There is moderate aortic insufficiency. The mean gradients 47 mmHg. He reports being completely asymptomatic and works a very strenuous Set designer job, does yard work and other activities. He denies any worsening fatigue, shortness of breath, congestive heart failure symptoms, presyncope, syncope or chest pain. I've offered stress testing to try to evaluate whether he is symptomatic, or the other option would be to wait an additional 6 months and repeat echocardiogram. On exam today there is a very faint S2, however his murmur is quite loud, louder then it had been previously. I suspect he will need valve replacement within the next year.  He's advised to contact if he becomes symptomatic at any point. Otherwise we'll plan a repeat echocardiogram in November and follow-up with me afterwards.  Chrystie Nose, MD, [facility] Attending Cardiologist [facility]  Chrystie Nose 01/15/2017, 4:53 PM

## 2017-01-20 DIAGNOSIS — I7300 Raynaud's syndrome without gangrene: Secondary | ICD-10-CM | POA: Diagnosis not present

## 2017-01-20 DIAGNOSIS — M5432 Sciatica, left side: Secondary | ICD-10-CM | POA: Diagnosis not present

## 2017-01-20 DIAGNOSIS — M329 Systemic lupus erythematosus, unspecified: Secondary | ICD-10-CM | POA: Diagnosis not present

## 2017-02-03 DIAGNOSIS — M7021 Olecranon bursitis, right elbow: Secondary | ICD-10-CM | POA: Diagnosis not present

## 2017-02-03 DIAGNOSIS — B86 Scabies: Secondary | ICD-10-CM | POA: Diagnosis not present

## 2017-04-29 DIAGNOSIS — Z23 Encounter for immunization: Secondary | ICD-10-CM | POA: Diagnosis not present

## 2017-06-07 ENCOUNTER — Ambulatory Visit (HOSPITAL_COMMUNITY): Payer: Commercial Managed Care - PPO | Attending: Cardiology

## 2017-06-07 ENCOUNTER — Other Ambulatory Visit: Payer: Self-pay

## 2017-06-07 DIAGNOSIS — I5030 Unspecified diastolic (congestive) heart failure: Secondary | ICD-10-CM | POA: Insufficient documentation

## 2017-06-07 DIAGNOSIS — I420 Dilated cardiomyopathy: Secondary | ICD-10-CM | POA: Diagnosis not present

## 2017-06-07 DIAGNOSIS — I350 Nonrheumatic aortic (valve) stenosis: Secondary | ICD-10-CM | POA: Diagnosis not present

## 2017-06-07 DIAGNOSIS — I083 Combined rheumatic disorders of mitral, aortic and tricuspid valves: Secondary | ICD-10-CM | POA: Insufficient documentation

## 2017-06-08 ENCOUNTER — Telehealth: Payer: Self-pay | Admitting: Internal Medicine

## 2017-06-08 NOTE — Telephone Encounter (Signed)
F/y Message  Pt returning RN call

## 2017-06-08 NOTE — Telephone Encounter (Signed)
New Message     Pt c/o Shortness Of Breath: STAT if SOB developed within the last 24 hours or pt is noticeably SOB on the phone  1. Are you currently SOB (can you hear that pt is SOB on the phone)? no  2. How long have you been experiencing SOB?  A week or so   3. Are you SOB when sitting or when up moving around? Straining to breathe when he walks   4. Are you currently experiencing any other symptoms?  Fatigue and breathing hard

## 2017-06-08 NOTE — Telephone Encounter (Signed)
Spoke with patient and he just wanted his follow up with Dr Rennis Golden sooner than next week. Explained Dr Rennis Golden not back in the office until next week. rescheduled appointment on Tuesday 06/15/17 for a time that worked better for patient

## 2017-06-08 NOTE — Telephone Encounter (Signed)
[id number]

## 2017-06-15 ENCOUNTER — Ambulatory Visit: Payer: Commercial Managed Care - PPO | Admitting: Internal Medicine

## 2017-06-15 ENCOUNTER — Ambulatory Visit: Payer: Self-pay | Admitting: Internal Medicine

## 2017-06-15 ENCOUNTER — Encounter: Payer: Self-pay | Admitting: Internal Medicine

## 2017-06-15 VITALS — BP 178/90 | HR 70 | Ht 66.0 in | Wt 163.2 lb

## 2017-06-15 DIAGNOSIS — I351 Nonrheumatic aortic (valve) insufficiency: Secondary | ICD-10-CM | POA: Diagnosis not present

## 2017-06-15 DIAGNOSIS — Q231 Congenital insufficiency of aortic valve: Secondary | ICD-10-CM

## 2017-06-15 DIAGNOSIS — I350 Nonrheumatic aortic (valve) stenosis: Secondary | ICD-10-CM | POA: Diagnosis not present

## 2017-06-15 DIAGNOSIS — Z79899 Other long term (current) drug therapy: Secondary | ICD-10-CM | POA: Diagnosis not present

## 2017-06-15 DIAGNOSIS — Z01818 Encounter for other preprocedural examination: Secondary | ICD-10-CM

## 2017-06-15 NOTE — Patient Instructions (Signed)
Medication Instructions: Your physician recommends that you continue on your current medications as directed. Please refer to the Current Medication list given to you today.  If you need a refill on your cardiac medications before your next appointment, please call your pharmacy.    Follow-Up: Your physician wants you to follow-up in: January with Dr. Rennis Golden. You will receive a reminder letter in the mail two months in advance. If you don't receive a letter, please call our office at [phone] to schedule this follow-up appointment.   Thank you for choosing [facility] at [facility]!!       [facility] CARDIOVASCULAR DIVISION [facility] [address] [place] [address] Dept: [phone] Loc: [phone]  ZIYA VEREB  06/15/2017  You are scheduled for a Cardiac Catheterization on Wednesday, November 21 at 2:30 pm with Dr. Verdis Prime.  1. Please arrive at the [facility] (Main Entrance A) at [facility]: [address] [place], [place] [address] at 12:30 PM (two hours before your procedure to ensure your preparation). Free valet parking service is available.   Special note: Every effort is made to have your procedure done on time. Please understand that emergencies sometimes delay scheduled procedures.  2. Diet: Do not eat or drink anything after midnight prior to your procedure except sips of water to take medications.  3. Labs: You will have your labs drawn today at the [facility] office.  4. Medication instructions in preparation for your procedure: Take your medications according to you norm.   On the morning of your procedure, take your Aspirin and any morning medicines NOT listed above.  You may use sips of water.  5. Plan for one night stay--bring personal belongings. 6. Bring a current list of your medications and current insurance cards. 7. You MUST have a responsible person to drive you home. 8.  Someone MUST be with you the first 24 hours after you arrive home or your discharge will be delayed. 9. Please wear clothes that are easy to get on and off and wear slip-on shoes.  Thank you for allowing [place] to care for you!   -- [facility]

## 2017-06-15 NOTE — H&P (View-Only) (Signed)
OFFICE NOTE  Chief Complaint:  Follow-up echocardiogram  Primary Care Physician: Milus Height, PA-C  HPI:  Luis Ferguson is a pleasant 56 year old male who is coming referred to me for an aortic murmur. He underwent an echocardiogram recently which demonstrated an EF of 55-60%, moderate calcific aortic valve stenosis without mention of a bicuspid valve or not, and moderate regurgitation. He apparently was told he had a heart murmur when he was child but this was not followed up on. He denies any chest pain or worsening shortness of breath. He does have a history of dyslipidemia is well and was diagnosed and is on treatment for lupus. There is no family history of congenital heart disease to his knowledge.  01/08/2016  Luis Ferguson returns today for follow-up. Overall he remains asymptomatic. He denies any chest pain or worsening shortness of breath. A repeat echocardiogram was performed which shows stable aortic stenosis and insufficiency. EF remains preserved at 55-60%. The left atrium was moderately dilated.  01/15/2017  Luis Ferguson returns today for follow-up. He recently had a repeat echo which now shows severe aortic stenosis. The aortic valve severely calcified with restricted leaflet motion. There was moderate regurgitation. The mean gradient was 47 mmHg. Left atrium was mildly dilated and LVEF was normal. I discussed the findings with him today and he reports he is for the most part asymptomatic. He works about 10 hours a day and when he comes home and goes and mows the lawn. 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 He reports lifting and being very active without any chest pain, shortness of breath, presyncope, syncope or other associated symptoms. Although he meets criteria by the echo for valve replacement, since he is asymptomatic we could consider watchful waiting at this time.  06/15/2017  Luis Ferguson was seen today in follow-up.  He had  a repeat echocardiogram to follow-up on aortic stenosis.  This was noted to be severe with a mean gradient of 47 mmHg and moderate aortic insufficiency.  6 months of now past and I asked him to watch his symptoms very closely.  He now notes that he has become more short of breath and his wife particularly notes that he fatigues much easier.  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  We did repeat his echo which showed preserved LV function however he is aortic valve gradient is slightly worse, but overall similar to the findings 6 months ago.  Based on his new symptomatology, I am recommending aortic valve replacement.  PMHx:  Past Medical History:  Diagnosis Date  . Connective tissue disease [facility])     Past Surgical History:  Procedure Laterality Date  . HERNIA REPAIR     left and right    FAMHx:  Family History  Problem Relation Age of Onset  . Diabetes Mother        also HTN  . Liver cancer Brother   . Diabetes Brother   . CAD Father 59       CABGx3    SOCHx:   reports that  has never smoked. he has never used smokeless tobacco. He reports that he does not drink alcohol or use drugs.  ALLERGIES:  Allergies  Allergen Reactions  . Codeine Itching and Rash    ROS: Pertinent items noted in HPI and remainder of comprehensive ROS otherwise negative.  HOME MEDS: Current Outpatient  Medications  Medication Sig Dispense Refill  . aspirin 81 MG tablet Take 81 mg by mouth daily.    . atorvastatin (LIPITOR) 20 MG tablet Take 20 mg by mouth daily.     . cholecalciferol (VITAMIN D) 1000 UNITS tablet Take 1,000 Units by mouth daily.    . fish oil-omega-3 fatty acids 1000 MG capsule Take 2 g by mouth daily.    . hydroxychloroquine (PLAQUENIL) 200 MG tablet Take 400 mg by mouth daily.     . Multiple Vitamins-Minerals (ONE-A-DAY 50 PLUS PO) Take by mouth.    . tamsulosin (FLOMAX) 0.4 MG CAPS Take 0.4 mg by mouth daily.     .  predniSONE (DELTASONE) 5 MG tablet Take 5 mg by mouth daily with breakfast.     No current facility-administered medications for this visit.     LABS/IMAGING: No results found for this or any previous visit (from the past 48 hour(s)). No results found.  WEIGHTS: Wt Readings from Last 3 Encounters:  06/15/17 163 lb 3.2 oz (74 kg)  01/15/17 162 lb 12.8 oz (73.8 kg)  01/08/16 162 lb 3.2 oz (73.6 kg)    VITALS: BP (!) 178/90   Pulse 70   Ht 5' 6" (1.676 m)   Wt 163 lb 3.2 oz (74 kg)   BMI 26.34 kg/m   EXAM: General appearance: alert and no distress Neck: no carotid bruit, no JVD and thyroid not enlarged, symmetric, no tenderness/mass/nodules Lungs: clear to auscultation bilaterally Heart: regular rate and rhythm, S1: normal, S2: decreased intensity and systolic murmur: late systolic 3/6, crescendo at 2nd right intercostal space Abdomen: soft, non-tender; bowel sounds normal; no masses,  no organomegaly Extremities: extremities normal, atraumatic, no cyanosis or edema Pulses: 2+ and symmetric Skin: Skin color, texture, turgor normal. No rashes or lesions Neurologic: Grossly normal Psych: Pleasant  EKG: N sinus rhythm at 70, moderate voltage criteria for LVH, nonspecific T wave changes-personally reviewed  ASSESSMENT: 1. Severe symptomatic calcific aortic valve stenosis, suspect bicuspid valve 2. Moderate aortic insufficiency 3. Dyslipidemia 4. Lupus/MCTD  PLAN: 1.   Luis Ferguson has severe symptomatic calcific aortic valve stenosis and a possible bicuspid valve.  There is moderate aortic insufficiency and he is becoming more short of breath with exertion.  He was asymptomatic 6 months ago.  His LVEF is preserved.  At this point I would recommend preoperative workup and referral to cardiac surgery for aortic valve replacement.  He should be a fairly low risk surgical candidate and I expect he will likely get a traditional aortic valve replacement.  We have arranged for left and  right heart catheterization with Dr. Smith tomorrow and he will need a cardiac surgery consultation afterwards, I suspect with follow-up in the office to discuss aortic valve replacement.  Follow-up with me otherwise in 2 months.  Aryaman Haliburton C. Kerstin Crusoe, MD, FACC, FACP  [place]  [facility]  Medical Director of the Advanced Lipid Disorders &  Cardiovascular Risk Reduction Clinic Attending Cardiologist  Direct Dial: [phone]  Fax: [phone]  Website:  [email or web address]  Hersey Maclellan C Puja Caffey 06/15/2017, 5:27 PM 

## 2017-06-15 NOTE — Progress Notes (Signed)
OFFICE NOTE  Chief Complaint:  Follow-up echocardiogram  Primary Care Physician: Milus Height, PA-C  HPI:  Luis Ferguson is a pleasant 56 year old male who is coming referred to me for an aortic murmur. He underwent an echocardiogram recently which demonstrated an EF of 55-60%, moderate calcific aortic valve stenosis without mention of a bicuspid valve or not, and moderate regurgitation. He apparently was told he had a heart murmur when he was child but this was not followed up on. He denies any chest pain or worsening shortness of breath. He does have a history of dyslipidemia is well and was diagnosed and is on treatment for lupus. There is no family history of congenital heart disease to his knowledge.  01/08/2016  Luis Ferguson returns today for follow-up. Overall he remains asymptomatic. He denies any chest pain or worsening shortness of breath. A repeat echocardiogram was performed which shows stable aortic stenosis and insufficiency. EF remains preserved at 55-60%. The left atrium was moderately dilated.  01/15/2017  Luis Ferguson returns today for follow-up. He recently had a repeat echo which now shows severe aortic stenosis. The aortic valve severely calcified with restricted leaflet motion. There was moderate regurgitation. The mean gradient was 47 mmHg. Left atrium was mildly dilated and LVEF was normal. I discussed the findings with him today and he reports he is for the most part asymptomatic. He works about 10 hours a day and when he comes home and goes and mows the lawn. 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 He reports lifting and being very active without any chest pain, shortness of breath, presyncope, syncope or other associated symptoms. Although he meets criteria by the echo for valve replacement, since he is asymptomatic we could consider watchful waiting at this time.  06/15/2017  Luis Ferguson was seen today in follow-up.  He had  a repeat echocardiogram to follow-up on aortic stenosis.  This was noted to be severe with a mean gradient of 47 mmHg and moderate aortic insufficiency.  6 months of now past and I asked him to watch his symptoms very closely.  He now notes that he has become more short of breath and his wife particularly notes that he fatigues much easier.  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  We did repeat his echo which showed preserved LV function however he is aortic valve gradient is slightly worse, but overall similar to the findings 6 months ago.  Based on his new symptomatology, I am recommending aortic valve replacement.  PMHx:  Past Medical History:  Diagnosis Date  . Connective tissue disease [facility])     Past Surgical History:  Procedure Laterality Date  . HERNIA REPAIR     left and right    FAMHx:  Family History  Problem Relation Age of Onset  . Diabetes Mother        also HTN  . Liver cancer Brother   . Diabetes Brother   . CAD Father 59       CABGx3    SOCHx:   reports that  has never smoked. he has never used smokeless tobacco. He reports that he does not drink alcohol or use drugs.  ALLERGIES:  Allergies  Allergen Reactions  . Codeine Itching and Rash    ROS: Pertinent items noted in HPI and remainder of comprehensive ROS otherwise negative.  HOME MEDS: Current Outpatient  Medications  Medication Sig Dispense Refill  . aspirin 81 MG tablet Take 81 mg by mouth daily.    [name]. atorvastatin (LIPITOR) 20 MG tablet Take 20 mg by mouth daily.     . cholecalciferol (VITAMIN D) 1000 UNITS tablet Take 1,000 Units by mouth daily.    . fish oil-omega-3 fatty acids 1000 MG capsule Take 2 g by mouth daily.    . hydroxychloroquine (PLAQUENIL) 200 MG tablet Take 400 mg by mouth daily.     . Multiple Vitamins-Minerals (ONE-A-DAY 50 PLUS PO) Take by mouth.    . tamsulosin (FLOMAX) 0.4 MG CAPS Take 0.4 mg by mouth daily.     .  predniSONE (DELTASONE) 5 MG tablet Take 5 mg by mouth daily with breakfast.     No current facility-administered medications for this visit.     LABS/IMAGING: No results found for this or any previous visit (from the past 48 hour(s)). No results found.  WEIGHTS: Wt Readings from Last 3 Encounters:  06/15/17 163 lb 3.2 oz (74 kg)  01/15/17 162 lb 12.8 oz (73.8 kg)  01/08/16 162 lb 3.2 oz (73.6 kg)    VITALS: BP (!) 178/90   Pulse 70   Ht 5\' 6"  (1.676 m)   Wt 163 lb 3.2 oz (74 kg)   BMI 26.34 kg/m   EXAM: General appearance: alert and no distress Neck: no carotid bruit, no JVD and thyroid not enlarged, symmetric, no tenderness/mass/nodules Lungs: clear to auscultation bilaterally Heart: regular rate and rhythm, S1: normal, S2: decreased intensity and systolic murmur: late systolic 3/6, crescendo at 2nd right intercostal space Abdomen: soft, non-tender; bowel sounds normal; no masses,  no organomegaly Extremities: extremities normal, atraumatic, no cyanosis or edema Pulses: 2+ and symmetric Skin: Skin color, texture, turgor normal. No rashes or lesions Neurologic: Grossly normal Psych: Pleasant  EKG: N sinus rhythm at 70, moderate voltage criteria for LVH, nonspecific T wave changes-personally reviewed  ASSESSMENT: 1. Severe symptomatic calcific aortic valve stenosis, suspect bicuspid valve 2. Moderate aortic insufficiency 3. Dyslipidemia 4. Lupus/MCTD  PLAN: 1.   Luis Ferguson has severe symptomatic calcific aortic valve stenosis and a possible bicuspid valve.  There is moderate aortic insufficiency and he is becoming more short of breath with exertion.  He was asymptomatic 6 months ago.  His LVEF is preserved.  At this point I would recommend preoperative workup and referral to cardiac surgery for aortic valve replacement.  He should be a fairly low risk surgical candidate and I expect he will likely get a traditional aortic valve replacement.  We have arranged for left and  right heart catheterization with Dr. Katrinka BlazingSmith tomorrow and he will need a cardiac surgery consultation afterwards, I suspect with follow-up in the office to discuss aortic valve replacement.  Follow-up with me otherwise in 2 months.  Chrystie NoseKenneth C. Asser Lucena, MD, [facility] MartinezFACC, FACP  [place]  [facility]  Medical Director of the Advanced Lipid Disorders &  Cardiovascular Risk Reduction Clinic Attending Cardiologist  Direct Dial: [phone]  Fax: [phone]  Website:  [email or web address]  Lisette AbuKenneth C Jessah Danser 06/15/2017, 5:27 PM

## 2017-06-16 ENCOUNTER — Ambulatory Visit (HOSPITAL_COMMUNITY)
Admission: RE | Admit: 2017-06-16 | Discharge: 2017-06-16 | Disposition: A | Discharge status: Home / Self Care | Payer: Commercial Managed Care - PPO | Source: Ambulatory Visit | Attending: Interventional Cardiology | Admitting: Interventional Cardiology

## 2017-06-16 ENCOUNTER — Encounter (HOSPITAL_COMMUNITY): Payer: Self-pay

## 2017-06-16 ENCOUNTER — Encounter (HOSPITAL_COMMUNITY)
Admission: RE | Disposition: A | Discharge status: Home / Self Care | Payer: Self-pay | Source: Ambulatory Visit | Attending: Interventional Cardiology

## 2017-06-16 DIAGNOSIS — I352 Nonrheumatic aortic (valve) stenosis with insufficiency: Secondary | ICD-10-CM | POA: Insufficient documentation

## 2017-06-16 DIAGNOSIS — Z7982 Long term (current) use of aspirin: Secondary | ICD-10-CM | POA: Insufficient documentation

## 2017-06-16 DIAGNOSIS — Z888 Allergy status to other drugs, medicaments and biological substances status: Secondary | ICD-10-CM | POA: Diagnosis not present

## 2017-06-16 DIAGNOSIS — I350 Nonrheumatic aortic (valve) stenosis: Secondary | ICD-10-CM

## 2017-06-16 DIAGNOSIS — Z79899 Other long term (current) drug therapy: Secondary | ICD-10-CM | POA: Insufficient documentation

## 2017-06-16 DIAGNOSIS — E785 Hyperlipidemia, unspecified: Secondary | ICD-10-CM | POA: Diagnosis not present

## 2017-06-16 DIAGNOSIS — IMO0002 Reserved for concepts with insufficient information to code with codable children: Secondary | ICD-10-CM | POA: Diagnosis present

## 2017-06-16 DIAGNOSIS — M329 Systemic lupus erythematosus, unspecified: Secondary | ICD-10-CM | POA: Diagnosis present

## 2017-06-16 DIAGNOSIS — I351 Nonrheumatic aortic (valve) insufficiency: Secondary | ICD-10-CM | POA: Diagnosis present

## 2017-06-16 HISTORY — PX: RIGHT/LEFT HEART CATH AND CORONARY ANGIOGRAPHY: CATH118266

## 2017-06-16 LAB — POCT I-STAT 3, ART BLOOD GAS (G3+)
ACID-BASE DEFICIT: 1 mmol/L (ref 0.0–2.0)
ACID-BASE DEFICIT: 1 mmol/L (ref 0.0–2.0)
Bicarbonate: 23.8 mmol/L (ref 20.0–28.0)
Bicarbonate: 24.3 mmol/L (ref 20.0–28.0)
O2 SAT: 98 %
O2 Saturation: 72 %
PO2 ART: 106 mmHg (ref 83.0–108.0)
PO2 ART: 39 mmHg — AB (ref 83.0–108.0)
TCO2: 25 mmol/L (ref 22–32)
TCO2: 26 mmol/L (ref 22–32)
pCO2 arterial: 39.4 mmHg (ref 32.0–48.0)
pCO2 arterial: 41.1 mmHg (ref 32.0–48.0)
pH, Arterial: 7.379 (ref 7.350–7.450)
pH, Arterial: 7.389 (ref 7.350–7.450)

## 2017-06-16 LAB — TSH: TSH: 3.53 u[IU]/mL (ref 0.450–4.500)

## 2017-06-16 LAB — CBC
HEMATOCRIT: 37.5 % (ref 37.5–51.0)
HEMOGLOBIN: 11.9 g/dL — AB (ref 13.0–17.7)
MCH: 28.5 pg (ref 26.6–33.0)
MCHC: 31.7 g/dL (ref 31.5–35.7)
MCV: 90 fL (ref 79–97)
Platelets: 165 10*3/uL (ref 150–379)
RBC: 4.18 x10E6/uL (ref 4.14–5.80)
RDW: 14.9 % (ref 12.3–15.4)
WBC: 3.3 10*3/uL — AB (ref 3.4–10.8)

## 2017-06-16 LAB — BASIC METABOLIC PANEL
BUN/Creatinine Ratio: 16 (ref 9–20)
BUN: 12 mg/dL (ref 6–24)
CALCIUM: 9 mg/dL (ref 8.7–10.2)
CHLORIDE: 105 mmol/L (ref 96–106)
CO2: 24 mmol/L (ref 20–29)
Creatinine, Ser: 0.75 mg/dL — ABNORMAL LOW (ref 0.76–1.27)
GFR calc non Af Amer: 102 mL/min/{1.73_m2} (ref >59)
GFR, EST AFRICAN AMERICAN: 118 mL/min/{1.73_m2} (ref >59)
GLUCOSE: 75 mg/dL (ref 65–99)
POTASSIUM: 4.6 mmol/L (ref 3.5–5.2)
Sodium: 142 mmol/L (ref 134–144)

## 2017-06-16 LAB — PROTIME-INR
INR: 1.2 (ref 0.8–1.2)
PROTHROMBIN TIME: 11.9 s (ref 9.1–12.0)

## 2017-06-16 LAB — POCT ACTIVATED CLOTTING TIME
ACTIVATED CLOTTING TIME: 197 s
Activated Clotting Time: 169 seconds

## 2017-06-16 LAB — APTT: aPTT: 28 s (ref 24–33)

## 2017-06-16 SURGERY — RIGHT/LEFT HEART CATH AND CORONARY ANGIOGRAPHY
Anesthesia: LOCAL

## 2017-06-16 MED ORDER — FENTANYL CITRATE (PF) 100 MCG/2ML IJ SOLN
INTRAMUSCULAR | Status: AC
  Filled 2017-06-16: qty 2

## 2017-06-16 MED ORDER — SODIUM CHLORIDE 0.9 % IV SOLN: INTRAVENOUS | Status: DC

## 2017-06-16 MED ORDER — HEPARIN SODIUM (PORCINE) 1000 UNIT/ML IJ SOLN
INTRAMUSCULAR | Status: DC | PRN
  Administered 2017-06-16: 4500 [IU] via INTRAVENOUS

## 2017-06-16 MED ORDER — IOPAMIDOL (ISOVUE-370) INJECTION 76%
INTRAVENOUS | Status: AC
  Filled 2017-06-16: qty 50

## 2017-06-16 MED ORDER — ACETAMINOPHEN 325 MG PO TABS: 650.0000 mg | ORAL_TABLET | ORAL | Status: DC | PRN

## 2017-06-16 MED ORDER — VERAPAMIL HCL 2.5 MG/ML IV SOLN
INTRAVENOUS | Status: AC
  Filled 2017-06-16: qty 2

## 2017-06-16 MED ORDER — FENTANYL CITRATE (PF) 100 MCG/2ML IJ SOLN
INTRAMUSCULAR | Status: DC | PRN
  Administered 2017-06-16: 50 ug via INTRAVENOUS

## 2017-06-16 MED ORDER — HEPARIN (PORCINE) IN NACL 2-0.9 UNIT/ML-% IJ SOLN
INTRAMUSCULAR | Status: AC | PRN
  Administered 2017-06-16: 1000 mL

## 2017-06-16 MED ORDER — HEPARIN (PORCINE) IN NACL 2-0.9 UNIT/ML-% IJ SOLN
INTRAMUSCULAR | Status: AC
  Filled 2017-06-16: qty 1000

## 2017-06-16 MED ORDER — SODIUM CHLORIDE 0.9 % IV SOLN
INTRAVENOUS | Status: DC
  Administered 2017-06-16: 14:00:00 via INTRAVENOUS

## 2017-06-16 MED ORDER — HEPARIN SODIUM (PORCINE) 1000 UNIT/ML IJ SOLN
INTRAMUSCULAR | Status: AC
  Filled 2017-06-16: qty 1

## 2017-06-16 MED ORDER — MIDAZOLAM HCL 2 MG/2ML IJ SOLN
INTRAMUSCULAR | Status: AC
  Filled 2017-06-16: qty 2

## 2017-06-16 MED ORDER — LABETALOL HCL 5 MG/ML IV SOLN: 10.0000 mg | Freq: Once | INTRAVENOUS | Status: DC

## 2017-06-16 MED ORDER — IOPAMIDOL (ISOVUE-370) INJECTION 76%
INTRAVENOUS | Status: DC | PRN
  Administered 2017-06-16: 115 mL via INTRA_ARTERIAL

## 2017-06-16 MED ORDER — SODIUM CHLORIDE 0.9% FLUSH: 3.0000 mL | Freq: Two times a day (BID) | INTRAVENOUS | Status: DC

## 2017-06-16 MED ORDER — LIDOCAINE HCL (PF) 1 % IJ SOLN
INTRAMUSCULAR | Status: AC
  Filled 2017-06-16: qty 30

## 2017-06-16 MED ORDER — SODIUM CHLORIDE 0.9 % IV SOLN: 250.0000 mL | INTRAVENOUS | Status: DC | PRN

## 2017-06-16 MED ORDER — IOPAMIDOL (ISOVUE-370) INJECTION 76%
INTRAVENOUS | Status: AC
  Filled 2017-06-16: qty 100

## 2017-06-16 MED ORDER — LIDOCAINE HCL (PF) 1 % IJ SOLN
INTRAMUSCULAR | Status: DC | PRN
  Administered 2017-06-16: 15 mL
  Administered 2017-06-16: 1 mL via INTRADERMAL

## 2017-06-16 MED ORDER — MIDAZOLAM HCL 2 MG/2ML IJ SOLN
INTRAMUSCULAR | Status: DC | PRN
  Administered 2017-06-16 (×2): 1 mg via INTRAVENOUS

## 2017-06-16 MED ORDER — SODIUM CHLORIDE 0.9% FLUSH: 3.0000 mL | INTRAVENOUS | Status: DC | PRN

## 2017-06-16 MED ORDER — ONDANSETRON HCL 4 MG/2ML IJ SOLN: 4.0000 mg | Freq: Four times a day (QID) | INTRAMUSCULAR | Status: DC | PRN

## 2017-06-16 MED ORDER — ASPIRIN 81 MG PO CHEW: 81.0000 mg | CHEWABLE_TABLET | ORAL | Status: DC

## 2017-06-16 SURGICAL SUPPLY — 20 items
CATH INFINITI 5 FR JL3.5 (CATHETERS) ×1 IMPLANT
CATH INFINITI 5FR ANG PIGTAIL (CATHETERS) ×1 IMPLANT
CATH INFINITI 5FR JL4 (CATHETERS) ×1 IMPLANT
CATH INFINITI JR4 5F (CATHETERS) ×1 IMPLANT
CATH SWAN GANZ 7F STRAIGHT (CATHETERS) ×1 IMPLANT
COVER PRB 48X5XTLSCP FOLD TPE (BAG) IMPLANT
COVER PROBE 5X48 (BAG) ×2
DEVICE RAD COMP TR BAND LRG (VASCULAR PRODUCTS) ×1 IMPLANT
GLIDESHEATH SLEND A-KIT 6F 22G (SHEATH) ×1 IMPLANT
GUIDEWIRE INQWIRE 1.5J.035X260 (WIRE) IMPLANT
INQWIRE 1.5J .035X260CM (WIRE) ×2
KIT HEART LEFT (KITS) ×2 IMPLANT
PACK CARDIAC CATHETERIZATION (CUSTOM PROCEDURE TRAY) ×2 IMPLANT
SHEATH GLIDE SLENDER 4/5FR (SHEATH) ×1 IMPLANT
SHEATH PINNACLE 5F 10CM (SHEATH) IMPLANT
SHEATH PINNACLE 7F 10CM (SHEATH) ×1 IMPLANT
SYR MEDRAD MARK V 150ML (SYRINGE) ×1 IMPLANT
TRANSDUCER W/STOPCOCK (MISCELLANEOUS) ×2 IMPLANT
TUBING CIL FLEX 10 FLL-RA (TUBING) ×2 IMPLANT
WIRE EMERALD ST .035X260CM (WIRE) ×1 IMPLANT

## 2017-06-16 NOTE — Progress Notes (Signed)
Patients 7 fr right femoral venous sheath was pulled per order. Manual pressure was held for 20 minutes. Patient tolerated pull well. Sheath site during and post sheath pull was clean, dry, intact and had no sign of a hematoma. Patients VS remained WNL's during and post sheath pull. Patients bedrest began at 1720 and ends in 2 fours at 1920. Vascular site assessment was a level 0. Sheath removal site was dressed with a gauze dressing and was clean, dry, and intact. Patient was educated about post sheath pull management and stated he understood and had no questions.

## 2017-06-16 NOTE — Discharge Instructions (Signed)
Radial Site Care Refer to this sheet in the next few weeks. These instructions provide you with information about caring for yourself after your procedure. Your health care provider may also give you more specific instructions. Your treatment has been planned according to current medical practices, but problems sometimes occur. Call your health care provider if you have any problems or questions after your procedure. What can I expect after the procedure? After your procedure, it is typical to have the following:  Bruising at the radial site that usually fades within 1-2 weeks.  Blood collecting in the tissue (hematoma) that may be painful to the touch. It should usually decrease in size and tenderness within 1-2 weeks.  Follow these instructions at home:  Take medicines only as directed by your health care provider.  You may shower 24-48 hours after the procedure or as directed by your health care provider. Remove the bandage (dressing) and gently wash the site with plain soap and water. Pat the area dry with a clean towel. Do not rub the site, because this may cause bleeding.  Do not take baths, swim, or use a hot tub until your health care provider approves.  Check your insertion site every day for redness, swelling, or drainage.  Do not apply powder or lotion to the site.  Do not flex or bend the affected arm for 24 hours or as directed by your health care provider.  Do not push or pull heavy objects with the affected arm for 24 hours or as directed by your health care provider.  Do not lift over 10 lb (4.5 kg) for 5 days after your procedure or as directed by your health care provider.  Ask your health care provider when it is okay to: ? Return to work or school. ? Resume usual physical activities or sports. ? Resume sexual activity.  Do not drive home if you are discharged the same day as the procedure. Have someone else drive you.  You may drive 24 hours after the procedure  unless otherwise instructed by your health care provider.  Do not operate machinery or power tools for 24 hours after the procedure.  If your procedure was done as an outpatient procedure, which means that you went home the same day as your procedure, a responsible adult should be with you for the first 24 hours after you arrive home.  Keep all follow-up visits as directed by your health care provider. This is important. Contact a health care provider if:  You have a fever.  You have chills.  You have increased bleeding from the radial site. Hold pressure on the site. CALL 911 Get help right away if:  You have unusual pain at the radial site.  You have redness, warmth, or swelling at the radial site.  You have drainage (other than a small amount of blood on the dressing) from the radial site.  The radial site is bleeding, and the bleeding does not stop after 30 minutes of holding steady pressure on the site.  Your arm or hand becomes pale, cool, tingly, or numb. This information is not intended to replace advice given to you by your health care provider. Make sure you discuss any questions you have with your health care provider. Document Released: 08/15/2010 Document Revised: 12/19/2015 Document Reviewed: 01/29/2014 Elsevier Interactive Patient Education  2018 [employer]  Venogram, Care After This sheet gives you information about how to care for yourself after your procedure. Your health care provider may also  give you more specific instructions. If you have problems or questions, contact your health care provider. What can I expect after the procedure? After the procedure, it is common to have bruising and tenderness at the catheter insertion area. Follow these instructions at home: Insertion site care  Follow instructions from your health care provider about how to take care of your insertion site. Make sure you: ? Wash your hands with soap and water before you change  your bandage (dressing). If soap and water are not available, use hand sanitizer. ? Change your dressing as told by your health care provider. ? Leave stitches (sutures), skin glue, or adhesive strips in place. These skin closures may need to stay in place for 2 weeks or longer. If adhesive strip edges start to loosen and curl up, you may trim the loose edges. Do not remove adhesive strips completely unless your health care provider tells you to do that.  Do not take baths, swim, or use a hot tub until your health care provider approves.  You may shower 24-48 hours after the procedure or as told by your health care provider. ? Gently wash the site with plain soap and water. ? Pat the area dry with a clean towel. ? Do not rub the site. This may cause bleeding.  Do not apply powder or lotion to the site. Keep the site clean and dry.  Check your insertion site every day for signs of infection. Check for: ? Redness, swelling, or pain. ? Fluid or blood. ? Warmth. ? Pus or a bad smell. Activity  Rest as told by your health care provider, usually for 1-2 days.  Do not lift anything that is heavier than 10 lbs. (4.5 kg) or as told by your health care provider.  Do not drive for 24 hours if you were given a medicine to help you relax (sedative).  Do not drive or use heavy machinery while taking prescription pain medicine. General instructions  Return to your normal activities as told by your health care provider, usually in about a week. Ask your health care provider what activities are safe for you.  If the catheter site starts bleeding, lie flat and put pressure on the site. If the bleeding does not stop, get help right away. This is a medical emergency.  Drink enough fluid to keep your urine clear or pale yellow. This helps flush the contrast dye from your body.  Take over-the-counter and prescription medicines only as told by your health care provider.  Keep all follow-up visits as  told by your health care provider. This is important. Contact a health care provider if:  You have a fever or chills.  You have redness, swelling, or pain around your insertion site.  You have fluid or blood coming from your insertion site.  The insertion site feels warm to the touch.  You have pus or a bad smell coming from your insertion site.  You have bruising around the insertion site.  You notice blood collecting in the tissue around the catheter site (hematoma). The hematoma may be painful to the touch. Get help right away if:  You have severe pain at the catheter insertion area.  The catheter insertion area swells very fast.  The catheter insertion area is bleeding, and the bleeding does not stop when you hold steady pressure on the area.  The area near or just beyond the catheter insertion site becomes pale, cool, tingly, or numb. These symptoms may represent a serious  problem that is an emergency. Do not wait to see if the symptoms will go away. Get medical help right away. Call your local emergency services (911 in the [place] Do not drive yourself to the hospital. Summary  After the procedure, it is common to have bruising and tenderness at the catheter insertion area.  After the procedure, it is important to rest and drink plenty of fluids.  Do not take baths, swim, or use a hot tub until your health care provider says it is okay to do so. You may shower 24-48 hours after the procedure or as told by your health care provider.  If the catheter site starts bleeding, lie flat and put pressure on the site. If the bleeding does not stop, get help right away. This is a medical emergency. This information is not intended to replace advice given to you by your health care provider. Make sure you discuss any questions you have with your health care provider. Document Released: 01/29/2005 Document Revised: 06/17/2016 Document Reviewed: 06/17/2016 Elsevier Interactive Patient  Education  2017 [employer].

## 2017-06-16 NOTE — Interval H&P Note (Signed)
Cath Lab Visit (complete for each Cath Lab visit)  Clinical Evaluation Leading to the Procedure:   ACS: No.  Non-ACS:    Anginal Classification: CCS III  Anti-ischemic medical therapy: No Therapy  Non-Invasive Test Results: No non-invasive testing performed  Prior CABG: No previous CABG      History and Physical Interval Note:  06/16/2017 3:25 PM  Luis Ferguson  has presented today for surgery, with the diagnosis of arotic stenosis  The various methods of treatment have been discussed with the patient and family. After consideration of risks, benefits and other options for treatment, the patient has consented to  Procedure(s): RIGHT/LEFT HEART CATH AND CORONARY ANGIOGRAPHY (N/A) as a surgical intervention .  [name] history has been reviewed, patient examined, no change in status, stable for surgery.  I have reviewed the patient's chart and labs.  Questions were answered to the patient's satisfaction.     Lyn Records III

## 2017-06-16 NOTE — Addendum Note (Signed)
Addended by: Chana Bode on: 06/16/2017 03:30 PM   Modules accepted: Orders

## 2017-06-18 ENCOUNTER — Encounter (HOSPITAL_COMMUNITY): Payer: Self-pay | Admitting: Interventional Cardiology

## 2017-06-22 ENCOUNTER — Other Ambulatory Visit: Payer: Self-pay | Admitting: *Deleted

## 2017-06-22 DIAGNOSIS — I351 Nonrheumatic aortic (valve) insufficiency: Secondary | ICD-10-CM

## 2017-06-22 DIAGNOSIS — I350 Nonrheumatic aortic (valve) stenosis: Secondary | ICD-10-CM

## 2017-06-24 ENCOUNTER — Other Ambulatory Visit: Payer: Self-pay | Admitting: *Deleted

## 2017-06-26 HISTORY — PX: AORTIC VALVE REPLACEMENT: SHX41

## 2017-06-29 ENCOUNTER — Institutional Professional Consult (permissible substitution): Payer: Commercial Managed Care - PPO | Admitting: Thoracic Surgery (Cardiothoracic Vascular Surgery)

## 2017-06-29 ENCOUNTER — Encounter: Payer: Self-pay | Admitting: Thoracic Surgery (Cardiothoracic Vascular Surgery)

## 2017-06-29 ENCOUNTER — Other Ambulatory Visit: Payer: Self-pay | Admitting: *Deleted

## 2017-06-29 ENCOUNTER — Other Ambulatory Visit: Payer: Self-pay

## 2017-06-29 VITALS — BP 139/95 | HR 73 | Ht 66.0 in | Wt 165.0 lb

## 2017-06-29 DIAGNOSIS — I712 Thoracic aortic aneurysm, without rupture, unspecified: Secondary | ICD-10-CM

## 2017-06-29 DIAGNOSIS — I350 Nonrheumatic aortic (valve) stenosis: Secondary | ICD-10-CM | POA: Diagnosis not present

## 2017-06-29 NOTE — Progress Notes (Signed)
PCP is Redmon, Altamease OilerNoelle, PA-C Referring Provider is Rennis GoldenHilty, Lisette AbuKenneth C, MD  Chief Complaint  Patient presents with  . New Patient (Initial Visit)    aortic valve stenosis, cath 06/16/2017, echo 06/07/2017    HPI: Luis Ferguson is sent for consideration for aortic valve replacement.  Luis Ferguson is a 56 year old man with a past history of lupus, dyslipidemia, and aortic stenosis and insufficiency.  He was first noted to have a murmur on exam by Dr. Sherlyn Lickedmon a couple of years ago.  He was referred to Dr. Rennis GoldenHilty, who has been following him with echocardiograms since then.  He initially was noted to have moderate aortic stenosis and insufficiency with preserved left ventricular function.  In June of this year he was noted to have progression to severe aortic stenosis with a mean gradient of 47 mmHg.  He was asymptomatic at that time.  He recently saw Dr. Rennis GoldenHilty again.  His echo was not significantly changed, but he has developed symptoms.  His wife says that she has noticed it more so than he has.  But he has "aged" significantly in the last 6 months.  He has decreased energy and fatigues more easily.  He also has noted some swelling in his legs when he standing up for long periods of time.  And he gets shortness of breath when walking up an incline.  He also has had several dizzy spells including one earlier today while at work.  He has not had any chest pain, pressure, or tightness.  He denies paroxysmal nocturnal dyspnea or orthopnea.  He has not had frank syncope.  He underwent cardiac catheterization which revealed normal coronary arteries.  He had a dental cleaning about 4 months ago.  Past Medical History:  Diagnosis Date  . Connective tissue disease [facility](HCC)     Past Surgical History:  Procedure Laterality Date  . HERNIA REPAIR     left and right  . RIGHT/LEFT HEART CATH AND CORONARY ANGIOGRAPHY N/A 06/16/2017   Procedure: RIGHT/LEFT HEART CATH AND CORONARY ANGIOGRAPHY;  Surgeon: Lyn RecordsSmith, Henry W,  MD;  Location: [facility] INVASIVE CV LAB;  Service: Cardiovascular;  Laterality: N/A;    Family History  Problem Relation Age of Onset  . Diabetes Mother        also HTN  . CAD Father 3470       CABGx3  . Liver cancer Brother   . Diabetes Brother     Social History Social History   Tobacco Use  . Smoking status: Never Smoker  . Smokeless tobacco: Never Used  Substance Use Topics  . Alcohol use: No  . Drug use: No    Current Outpatient Medications  Medication Sig Dispense Refill  . aspirin 81 MG tablet Take 81 mg by mouth daily.    [name]. atorvastatin (LIPITOR) 20 MG tablet Take 20 mg by mouth daily.     . calcipotriene (DOVONOX) 0.005 % cream Apply topically 2 (two) times daily.    . cholecalciferol (VITAMIN D) 1000 UNITS tablet Take 1,000 Units by mouth daily.    . clobetasol ointment (TEMOVATE) 0.05 % Apply 1 application topically as needed.    . fish oil-omega-3 fatty acids 1000 MG capsule Take 1 g by mouth daily.     . hydroxychloroquine (PLAQUENIL) 200 MG tablet Take 400 mg by mouth daily.     [name]. PRESCRIPTION MEDICATION Prugen    . tamsulosin (FLOMAX) 0.4 MG CAPS Take 0.4 mg by mouth daily.      No current  facility-administered medications for this visit.     Allergies  Allergen Reactions  . Codeine Itching and Rash    Review of Systems  Constitutional: Positive for fatigue. Negative for activity change, appetite change and unexpected weight change.  HENT: Negative for dental problem, trouble swallowing and voice change.   Eyes: Negative for visual disturbance.  Respiratory: Positive for shortness of breath. Negative for cough and wheezing.   Cardiovascular: Positive for leg swelling. Negative for chest pain.  Endocrine: Negative for polydipsia and polyphagia.  Genitourinary: Negative for difficulty urinating and dysuria.  Musculoskeletal: Positive for arthralgias (Particularly shoulders) and joint swelling.  Neurological: Positive for dizziness. Negative for syncope.   Hematological: Negative for adenopathy. Does not bruise/bleed easily.  All other systems reviewed and are negative.   BP (!) 139/95   Pulse 73   Ht 5\' 6"  (1.676 m)   Wt 165 lb (74.8 kg)   SpO2 97%   BMI 26.63 kg/m  Physical Exam  Constitutional: He is oriented to person, place, and time. He appears well-developed and well-nourished. No distress.  HENT:  Head: Normocephalic and atraumatic.  Mouth/Throat: No oropharyngeal exudate.  Eyes: Conjunctivae and EOM are normal. No scleral icterus.  Neck: No thyromegaly present.  Cardiovascular: Normal rate and regular rhythm.  Murmur (2/6 to 3/6 systolic murmur right upper sternal border and throughout precordium, faint diastolic component) heard. Pulmonary/Chest: Effort normal and breath sounds normal. No respiratory distress. He has no wheezes. He has no rales.  Abdominal: Soft. He exhibits no distension. There is no tenderness.  Musculoskeletal: He exhibits no edema or deformity.  Lymphadenopathy:    He has no cervical adenopathy.  Neurological: He is alert and oriented to person, place, and time. No cranial nerve deficit.  Motor grossly intact  Skin: Skin is warm and dry.  Vitals reviewed.    Diagnostic Tests: Transthoracic echocardiogram 06/07/2017 Study Conclusions  - Left ventricle: The cavity size was normal. There was moderate   concentric hypertrophy. Systolic function was normal. The   estimated ejection fraction was in the range of 60% to 65%. Wall   motion was normal; there were no regional wall motion   abnormalities. Features are consistent with a pseudonormal left   ventricular filling pattern, with concomitant abnormal relaxation   and increased filling pressure (grade 2 diastolic dysfunction). - Aortic valve: Valve mobility was restricted. There was severe   stenosis. There was moderate regurgitation. Mean gradient (S): 48   mm Hg. Peak gradient (S): 89 mm Hg. - Mitral valve: There was mild regurgitation. -  Left atrium: The atrium was moderately dilated. - Right ventricle: Systolic function was normal. - Tricuspid valve: There was mild regurgitation. - Pulmonic valve: There was trivial regurgitation.  Impressions:  - There is no significant change since the prior study on   01/11/2017.  Cardiac catheterization 06/16/2017 Conclusion    Moderately severe to severe aortic stenosis with mild aortic regurgitation.  Mean gradient 41 mmHg.  Widely patent coronary arteries.  Normal left ventricular systolic function with EF 55% and elevated LVEDP at 20 mmHg consistent with chronic diastolic heart failure.  Moderately  calcified aortic valve on fluoroscopy.  Normal right heart pressures    I personally reviewed the echo and catheterization images and concur with the findings noted above  Impression: Luis Ferguson is a 56 year old gentleman with severe aortic stenosis which is now symptomatic.  He has had several dizzy spells recently including one today.  He is noticed a significant decrease in his energy  level and exercise tolerance and he has shortness of breath with exertion.  His valve area calculates to 0.7 cm with a mean gradient of 47 mmHg by echo and 41 mmHg by catheterization.  Symptomatic aortic stenosis is an indication for aortic valve replacement.  I discussed several issues with Luis Ferguson.  The first issue is her decision between tissue and mechanical valve.  We discussed the relative advantages and disadvantages of each of those.  He had already done significant thinking about this and strongly desires to have a tissue valve placed even understanding that it could mean that he needs redo surgery in the intermediate to long-term.  He does not want to take lifelong anticoagulation.  Another issue of surgical approach.  The standard approach being median sternotomy.  He may potentially be a candidate for a right mini thoracotomy or partial sternotomy, but we would need to  see a CT scan to assess his anatomy before making that decision.  He is interested in a minimally invasive approach.  We briefly discussed the risk of surgery.  He is a low risk candidate, but does understand there is risk of death or major morbidity.  We will discuss risks in more detail when he returns to the office.  Plan: CT angiogram of chest to assess anatomy for possible minimally invasive approach.  Return in 1 week to schedule surgery.  [name], MD [facility] [phone]

## 2017-06-30 ENCOUNTER — Ambulatory Visit
Admission: RE | Admit: 2017-06-30 | Discharge: 2017-06-30 | Disposition: A | Discharge status: Home / Self Care | Payer: Commercial Managed Care - PPO | Source: Ambulatory Visit | Attending: Thoracic Surgery (Cardiothoracic Vascular Surgery) | Admitting: Thoracic Surgery (Cardiothoracic Vascular Surgery)

## 2017-06-30 DIAGNOSIS — I712 Thoracic aortic aneurysm, without rupture, unspecified: Secondary | ICD-10-CM

## 2017-06-30 MED ORDER — IOPAMIDOL (ISOVUE-370) INJECTION 76%
75.0000 mL | Freq: Once | INTRAVENOUS | Status: AC | PRN
  Administered 2017-06-30: 75 mL via INTRAVENOUS

## 2017-07-06 ENCOUNTER — Other Ambulatory Visit: Payer: Self-pay

## 2017-07-06 ENCOUNTER — Ambulatory Visit: Payer: Commercial Managed Care - PPO | Admitting: Thoracic Surgery (Cardiothoracic Vascular Surgery)

## 2017-07-06 ENCOUNTER — Encounter: Payer: Self-pay | Admitting: Thoracic Surgery (Cardiothoracic Vascular Surgery)

## 2017-07-06 VITALS — BP 143/96 | HR 82 | Ht 66.0 in | Wt 165.0 lb

## 2017-07-06 DIAGNOSIS — I350 Nonrheumatic aortic (valve) stenosis: Secondary | ICD-10-CM

## 2017-07-06 NOTE — H&P (View-Only) (Signed)
[address]       [name] [address]             [phone]       HPI: Mr. Bras returns to discuss aortic valve replacement.  He is a 56 year old man with a history of lupus, dyslipidemia, and known aortic stenosis and insufficiency.  He was found to have a heart murmur by Dr. Sherlyn Lick a couple of years ago.  He was referred to Dr. Rennis Golden.  An echocardiogram showed moderate aortic stenosis and insufficiency with preserved left ventricular function.  In June he was asymptomatic but his echocardiogram showed progression to severe aortic stenosis with a mean gradient of 47 mmHg.  He recently saw Dr. Rennis Golden.  His echocardiogram did not show significant change, but he complained of decreased energy and fatigue, swelling in his legs, shortness of breath with exertion, and dizzy spells.  He does not have any chest pain and has not had any frank syncope.  Dr. Rennis Golden recommended aortic valve replacement.  I saw Mr. Gegg last week.  We discussed aortic valve replacement which he wishes to pursue.  He feels very strongly about not wanting to be on Coumadin and wishes to have a tissue valve, despite the high probability of needing a redo procedure.  Past Medical History:  Diagnosis Date  . Connective tissue disease [facility])    Past Surgical History:  Procedure Laterality Date  . HERNIA REPAIR     left and right  . RIGHT/LEFT HEART CATH AND CORONARY ANGIOGRAPHY N/A 06/16/2017   Procedure: RIGHT/LEFT HEART CATH AND CORONARY ANGIOGRAPHY;  Surgeon: Lyn Records, MD;  Location: [facility];  Service: Cardiovascular;  Laterality: N/A;    Current Outpatient Medications  Medication Sig Dispense Refill  . aspirin 81 MG tablet Take 81 mg by mouth daily.    [name] atorvastatin (LIPITOR) 20 MG tablet Take 20 mg by mouth daily.     . calcipotriene (DOVONOX) 0.005 % cream Apply topically 2 (two) times daily.    . cholecalciferol (VITAMIN D) 1000 UNITS tablet Take 1,000 Units by  mouth daily.    . clobetasol ointment (TEMOVATE) 0.05 % Apply 1 application topically as needed.    . fish oil-omega-3 fatty acids 1000 MG capsule Take 1 g by mouth daily.     . hydroxychloroquine (PLAQUENIL) 200 MG tablet Take 400 mg by mouth daily.     [name] PRESCRIPTION MEDICATION Prugen    . tamsulosin (FLOMAX) 0.4 MG CAPS Take 0.4 mg by mouth daily.      No current facility-administered medications for this visit.    Family History  Problem Relation Age of Onset  . Diabetes Mother        also HTN  . CAD Father 10       CABGx3  . Liver cancer Brother   . Diabetes Brother    Social History   Socioeconomic History  . Marital status: Married    Spouse name: Not on file  . Number of children: Not on file  . Years of education: Not on file  . Highest education level: Not on file  Social Needs  . Financial resource strain: Not on file  . Food insecurity - worry: Not on file  . Food insecurity - inability: Not on file  . Transportation needs - medical: Not on file  . Transportation needs - non-medical: Not on file  Occupational History  . Not on file  Tobacco Use  .  Smoking status: Never Smoker  . Smokeless tobacco: Never Used  Substance and Sexual Activity  . Alcohol use: No  . Drug use: No  . Sexual activity: Yes  Other Topics Concern  . Not on file  Social History Narrative  . Not on file    Physical Exam BP (!) 143/96   Pulse 82   Ht 5\' 6"  (1.676 m)   Wt 165 lb (74.8 kg)   SpO2 99%   BMI 26.59 kg/m  56 year old man in no acute distress Well-developed and well-nourished Alert and oriented x3 with no focal deficits HEENT unremarkable Neck supple, positive transmitted murmur bilaterally Cardiac regular rate and rhythm with a 2/6 to 3/6 systolic murmur throughout precordium, loudest at right upper sternal border, faint diastolic component Lungs clear with equal breath sounds bilaterally Abdomen soft nontender Extremities without clubbing cyanosis or  edema  Diagnostic Tests: Transthoracic echocardiogram 06/07/2017 Study Conclusions  - Left ventricle: The cavity size was normal. There was moderate concentric hypertrophy. Systolic function was normal. The estimated ejection fraction was in the range of 60% to 65%. Wall motion was normal; there were no regional wall motion abnormalities. Features are consistent with a pseudonormal left ventricular filling pattern, with concomitant abnormal relaxation and increased filling pressure (grade 2 diastolic dysfunction). - Aortic valve: Valve mobility was restricted. There was severe stenosis. There was moderate regurgitation. Mean gradient (S): 48 mm Hg. Peak gradient (S): 89 mm Hg. - Mitral valve: There was mild regurgitation. - Left atrium: The atrium was moderately dilated. - Right ventricle: Systolic function was normal. - Tricuspid valve: There was mild regurgitation. - Pulmonic valve: There was trivial regurgitation.  Impressions:  - There is no significant change since the prior study on 01/11/2017.  Cardiac catheterization 06/16/2017 Conclusion    Moderately severe to severe aortic stenosis with mild aortic regurgitation. Mean gradient 41 mmHg.  Widely patent coronary arteries.  Normal left ventricular systolic function with EF 55% and elevated LVEDP at 20 mmHg consistent with chronic diastolic heart failure.  Moderately calcified aortic valve on fluoroscopy.  Normal right heart pressures   CT ANGIOGRAPHY CHEST WITH CONTRAST  TECHNIQUE: Multidetector CT imaging of the chest was performed using the standard protocol during bolus administration of intravenous contrast. Multiplanar CT image reconstructions and MIPs were obtained to evaluate the vascular anatomy.  CONTRAST:  75mL ISOVUE-370 IOPAMIDOL (ISOVUE-370) INJECTION 76%  COMPARISON:  None.  FINDINGS: Cardiovascular: No evidence of thoracic aortic aneurysm or dissection. Aortic  atherosclerosis. Coronary artery calcification. No central pulmonary embolism.  Mediastinum/Nodes: No masses or pathologically enlarged lymph nodes identified.  Lungs/Pleura: Some respiratory motion artifact is noted. A few sub-cm nodules are seen in both upper lobes measuring up to 4 mm. No evidence of pulmonary infiltrate or pleural effusion.  Upper abdomen: No acute findings.  Musculoskeletal: No suspicious bone lesions identified.  Review of the MIP images confirms the above findings.  IMPRESSION: No evidence of thoracic aortic aneurysm or dissection.  Several tiny bilateral indeterminate pulmonary nodules measuring up to 4 mm. No follow-up needed if patient is low-risk (and has no known or suspected primary neoplasm). Non-contrast chest CT can be considered in 12 months if patient is high-risk. This recommendation follows the consensus statement: Guidelines for Management of Incidental Pulmonary Nodules Detected on CT Images: From the Fleischner Society 2017; Radiology 2017; 284:228-243.  Aortic and coronary artery atherosclerosis.   Electronically Signed   By: Myles Rosenthal M.D.   On: 06/30/2017 17:07  I personally reviewed the CT, echo  and catheterization images and concur with the findings noted above  Impression: [name] is a 56 year old man with severe aortic stenosis that is symptomatic.  Aortic valve replacement is indicated for survival benefit and relief of symptoms.  I once again discussed aortic valve replacement with Mr. Mrs. Laural BenesJohnson.  We again discussed the pros and cons of tissue valve versus mechanical valves.  He still feels strongly that he prefers a tissue valve even with the understanding of the possibility of need for redo procedure.  He had dental cleaning 4 months ago and does not have any current issues.  We discussed several different surgical approaches.  Median sternotomy, partial sternotomy, and right mini thoracotomy were all  discussed with the patient.  He understands the relative advantages and disadvantages of each of those approaches.  He strongly prefers a right mini thoracotomy approach.  I discussed again with him the general nature of the procedure including the need for general anesthesia, the incisions to be used, the use of cardiopulmonary bypass, use of drainage tubes postoperatively, the expected hospital stay, and the overall recovery.  I reviewed the indications, risks, benefits, and alternatives.  He understands the risks include, but are not limited to death, MI, DVT, PE, bleeding, possible need for transfusion, infection, stroke, complete heart block requiring permanent pacemaker placement, cardiac arrhythmias such as atrial fibrillation, as well as the possibility of other unforeseeable complications.  He accepts those risks and wishes to proceed.  Plan: Aortic valve replacement via right minithoracotomy on Monday, 07/12/2017.  Loreli SlotSteven C Natisha Trzcinski, MD [facility] [phone]

## 2017-07-06 NOTE — Progress Notes (Signed)
    [address]       [place], [address]             [phone]       HPI: Luis Ferguson returns to discuss aortic valve replacement.  He is a 56-year-old man with a history of lupus, dyslipidemia, and known aortic stenosis and insufficiency.  He was found to have a heart murmur by Dr. Redmon a couple of years ago.  He was referred to Dr. Hilty.  An echocardiogram showed moderate aortic stenosis and insufficiency with preserved left ventricular function.  In June he was asymptomatic but his echocardiogram showed progression to severe aortic stenosis with a mean gradient of 47 mmHg.  He recently saw Dr. Hilty.  His echocardiogram did not show significant change, but he complained of decreased energy and fatigue, swelling in his legs, shortness of breath with exertion, and dizzy spells.  He does not have any chest pain and has not had any frank syncope.  Dr. Hilty recommended aortic valve replacement.  I saw Luis Ferguson last week.  We discussed aortic valve replacement which he wishes to pursue.  He feels very strongly about not wanting to be on Coumadin and wishes to have a tissue valve, despite the high probability of needing a redo procedure.  Past Medical History:  Diagnosis Date  . Connective tissue disease ([facility])    Past Surgical History:  Procedure Laterality Date  . HERNIA REPAIR     left and right  . RIGHT/LEFT HEART CATH AND CORONARY ANGIOGRAPHY N/A 06/16/2017   Procedure: RIGHT/LEFT HEART CATH AND CORONARY ANGIOGRAPHY;  Surgeon: Smith, Henry W, MD;  Location: [facility];  Service: Cardiovascular;  Laterality: N/A;    Current Outpatient Medications  Medication Sig Dispense Refill  . aspirin 81 MG tablet Take 81 mg by mouth daily.    . atorvastatin (LIPITOR) 20 MG tablet Take 20 mg by mouth daily.     . calcipotriene (DOVONOX) 0.005 % cream Apply topically 2 (two) times daily.    . cholecalciferol (VITAMIN D) 1000 UNITS tablet Take 1,000 Units by  mouth daily.    . clobetasol ointment (TEMOVATE) 0.05 % Apply 1 application topically as needed.    . fish oil-omega-3 fatty acids 1000 MG capsule Take 1 g by mouth daily.     . hydroxychloroquine (PLAQUENIL) 200 MG tablet Take 400 mg by mouth daily.     . PRESCRIPTION MEDICATION Prugen    . tamsulosin (FLOMAX) 0.4 MG CAPS Take 0.4 mg by mouth daily.      No current facility-administered medications for this visit.    Family History  Problem Relation Age of Onset  . Diabetes Mother        also HTN  . CAD Father 70       CABGx3  . Liver cancer Brother   . Diabetes Brother    Social History   Socioeconomic History  . Marital status: Married    Spouse name: Not on file  . Number of children: Not on file  . Years of education: Not on file  . Highest education level: Not on file  Social Needs  . Financial resource strain: Not on file  . Food insecurity - worry: Not on file  . Food insecurity - inability: Not on file  . Transportation needs - medical: Not on file  . Transportation needs - non-medical: Not on file  Occupational History  . Not on file  Tobacco Use  .   Smoking status: Never Smoker  . Smokeless tobacco: Never Used  Substance and Sexual Activity  . Alcohol use: No  . Drug use: No  . Sexual activity: Yes  Other Topics Concern  . Not on file  Social History Narrative  . Not on file    Physical Exam BP (!) 143/96   Pulse 82   Ht 5' 6" (1.676 m)   Wt 165 lb (74.8 kg)   SpO2 99%   BMI 26.63 kg/m  56-year-old man in no acute distress Well-developed and well-nourished Alert and oriented x3 with no focal deficits HEENT unremarkable Neck supple, positive transmitted murmur bilaterally Cardiac regular rate and rhythm with a 2/6 to 3/6 systolic murmur throughout precordium, loudest at right upper sternal border, faint diastolic component Lungs clear with equal breath sounds bilaterally Abdomen soft nontender Extremities without clubbing cyanosis or  edema  Diagnostic Tests: Transthoracic echocardiogram 06/07/2017 Study Conclusions  - Left ventricle: The cavity size was normal. There was moderate concentric hypertrophy. Systolic function was normal. The estimated ejection fraction was in the range of 60% to 65%. Wall motion was normal; there were no regional wall motion abnormalities. Features are consistent with a pseudonormal left ventricular filling pattern, with concomitant abnormal relaxation and increased filling pressure (grade 2 diastolic dysfunction). - Aortic valve: Valve mobility was restricted. There was severe stenosis. There was moderate regurgitation. Mean gradient (S): 48 mm Hg. Peak gradient (S): 89 mm Hg. - Mitral valve: There was mild regurgitation. - Left atrium: The atrium was moderately dilated. - Right ventricle: Systolic function was normal. - Tricuspid valve: There was mild regurgitation. - Pulmonic valve: There was trivial regurgitation.  Impressions:  - There is no significant change since the prior study on 01/11/2017.  Cardiac catheterization 06/16/2017 Conclusion    Moderately severe to severe aortic stenosis with mild aortic regurgitation. Mean gradient 41 mmHg.  Widely patent coronary arteries.  Normal left ventricular systolic function with EF 55% and elevated LVEDP at 20 mmHg consistent with chronic diastolic heart failure.  Moderately calcified aortic valve on fluoroscopy.  Normal right heart pressures   CT ANGIOGRAPHY CHEST WITH CONTRAST  TECHNIQUE: Multidetector CT imaging of the chest was performed using the standard protocol during bolus administration of intravenous contrast. Multiplanar CT image reconstructions and MIPs were obtained to evaluate the vascular anatomy.  CONTRAST:  75mL ISOVUE-370 IOPAMIDOL (ISOVUE-370) INJECTION 76%  COMPARISON:  None.  FINDINGS: Cardiovascular: No evidence of thoracic aortic aneurysm or dissection. Aortic  atherosclerosis. Coronary artery calcification. No central pulmonary embolism.  Mediastinum/Nodes: No masses or pathologically enlarged lymph nodes identified.  Lungs/Pleura: Some respiratory motion artifact is noted. A few sub-cm nodules are seen in both upper lobes measuring up to 4 mm. No evidence of pulmonary infiltrate or pleural effusion.  Upper abdomen: No acute findings.  Musculoskeletal: No suspicious bone lesions identified.  Review of the MIP images confirms the above findings.  IMPRESSION: No evidence of thoracic aortic aneurysm or dissection.  Several tiny bilateral indeterminate pulmonary nodules measuring up to 4 mm. No follow-up needed if patient is low-risk (and has no known or suspected primary neoplasm). Non-contrast chest CT can be considered in 12 months if patient is high-risk. This recommendation follows the consensus statement: Guidelines for Management of Incidental Pulmonary Nodules Detected on CT Images: From the Fleischner Society 2017; Radiology 2017; 284:228-243.  Aortic and coronary artery atherosclerosis.   Electronically Signed   By: John  Stahl M.D.   On: 06/30/2017 17:07  I personally reviewed the CT, echo   and catheterization images and concur with the findings noted above  Impression: Luis Ferguson is a 56-year-old man with severe aortic stenosis that is symptomatic.  Aortic valve replacement is indicated for survival benefit and relief of symptoms.  I once again discussed aortic valve replacement with Luis Ferguson.  We again discussed the pros and cons of tissue valve versus mechanical valves.  He still feels strongly that he prefers a tissue valve even with the understanding of the possibility of need for redo procedure.  He had dental cleaning 4 months ago and does not have any current issues.  We discussed several different surgical approaches.  Median sternotomy, partial sternotomy, and right mini thoracotomy were all  discussed with the patient.  He understands the relative advantages and disadvantages of each of those approaches.  He strongly prefers a right mini thoracotomy approach.  I discussed again with him the general nature of the procedure including the need for general anesthesia, the incisions to be used, the use of cardiopulmonary bypass, use of drainage tubes postoperatively, the expected hospital stay, and the overall recovery.  I reviewed the indications, risks, benefits, and alternatives.  He understands the risks include, but are not limited to death, MI, DVT, PE, bleeding, possible need for transfusion, infection, stroke, complete heart block requiring permanent pacemaker placement, cardiac arrhythmias such as atrial fibrillation, as well as the possibility of other unforeseeable complications.  He accepts those risks and wishes to proceed.  Plan: Aortic valve replacement via right minithoracotomy on Monday, 07/12/2017.  Olon Russ C Kaity Pitstick, MD [facility] [phone]    

## 2017-07-07 DIAGNOSIS — Z736 Limitation of activities due to disability: Secondary | ICD-10-CM

## 2017-07-08 NOTE — Pre-Procedure Instructions (Signed)
CASSIAN LISZEWSKI  07/08/2017      [id number] - [place], [place] - [address] [place] [address] Phone: [phone] Fax: [phone]  [facility] - [place], [place] - [address] [place] [address] Phone: [phone] Fax: [phone]    Your procedure is scheduled on Monday 07/12/2017.  Report to [facility] Admitting at 0530 A.M.  Call this number if you have problems the morning of surgery:  [phone]   Remember:  Do not eat food or drink liquids after midnight.  Take these medicines the morning of surgery with A SIP OF WATER: Hydroxychloroquine (Plaquenil) Tamsulosin (Flomax)  7 days prior to surgery STOP taking any Aspirin (unless otherwise instructed by your surgeon), Aleve, Naproxen, Ibuprofen, Motrin, Advil, Goody's, BC's, all herbal medications, fish oil, and all vitamins     Do not wear jewelry  Do not wear lotions, powders, or colognes, or deodorant.  Men may shave face and neck.  Do not bring valuables to the hospital.  [facility] is not responsible for any belongings or valuables.  Contacts, eyeglasses, dentures or bridgework may not be worn into surgery.  Leave your suitcase in the car.  After surgery it may be brought to your room.  For patients admitted to the hospital, discharge time will be determined by your treatment team.  Patients discharged the day of surgery will not be allowed to drive home.   Name and phone number of your driver:    Special instructions:   [place]- Preparing For Surgery  Before surgery, you can play an important role. Because skin is not sterile, your skin needs to be as free of germs as possible. You can reduce the number of germs on your skin by washing with CHG (chlorahexidine gluconate) Soap before surgery.  CHG is an antiseptic cleaner which kills germs and bonds with the skin to continue killing  germs even after washing.  Please do not use if you have an allergy to CHG or antibacterial soaps. If your skin becomes reddened/irritated stop using the CHG.  Do not shave (including legs and underarms) for at least 48 hours prior to first CHG shower. It is OK to shave your face.  Please follow these instructions carefully.   1. Shower the NIGHT BEFORE SURGERY and the MORNING OF SURGERY with CHG.   2. If you chose to wash your hair, wash your hair first as usual with your normal shampoo.  3. After you shampoo, rinse your hair and body thoroughly to remove the shampoo.  4. Use CHG as you would any other liquid soap. You can apply CHG directly to the skin and wash gently with a scrungie or a clean washcloth.   5. Apply the CHG Soap to your body ONLY FROM THE NECK DOWN.  Do not use on open wounds or open sores. Avoid contact with your eyes, ears, mouth and genitals (private parts). Wash Face and genitals (private parts)  with your normal soap.  6. Wash thoroughly, paying special attention to the area where your surgery will be performed.  7. Thoroughly rinse your body with warm water from the neck down.  8. DO NOT shower/wash with your normal soap after using and rinsing off the CHG Soap.  9. Pat yourself dry with a CLEAN TOWEL.  10. Wear CLEAN PAJAMAS to bed the night before surgery, wear comfortable clothes the morning of surgery  11. Place CLEAN SHEETS on your bed the night of your first shower and DO NOT SLEEP WITH PETS.    Day of Surgery: Shower as stated above. Do not apply any deodorants/lotions.  Please wear clean clothes to the hospital/surgery center.      Please read over the following fact sheets that you were given.

## 2017-07-09 ENCOUNTER — Encounter (HOSPITAL_COMMUNITY): Payer: Self-pay

## 2017-07-09 ENCOUNTER — Encounter (HOSPITAL_COMMUNITY)
Admission: RE | Admit: 2017-07-09 | Discharge: 2017-07-09 | Disposition: A | Discharge status: Home / Self Care | Payer: Commercial Managed Care - PPO | Source: Ambulatory Visit | Attending: Thoracic Surgery (Cardiothoracic Vascular Surgery) | Admitting: Thoracic Surgery (Cardiothoracic Vascular Surgery)

## 2017-07-09 ENCOUNTER — Ambulatory Visit (HOSPITAL_COMMUNITY)
Admission: RE | Admit: 2017-07-09 | Discharge: 2017-07-09 | Disposition: A | Discharge status: Home / Self Care | Payer: Commercial Managed Care - PPO | Source: Ambulatory Visit | Attending: Thoracic Surgery (Cardiothoracic Vascular Surgery) | Admitting: Thoracic Surgery (Cardiothoracic Vascular Surgery)

## 2017-07-09 ENCOUNTER — Ambulatory Visit (HOSPITAL_BASED_OUTPATIENT_CLINIC_OR_DEPARTMENT_OTHER)
Admission: RE | Admit: 2017-07-09 | Discharge: 2017-07-09 | Disposition: A | Discharge status: Home / Self Care | Payer: Commercial Managed Care - PPO | Source: Ambulatory Visit | Attending: Thoracic Surgery (Cardiothoracic Vascular Surgery) | Admitting: Thoracic Surgery (Cardiothoracic Vascular Surgery)

## 2017-07-09 ENCOUNTER — Other Ambulatory Visit: Payer: Self-pay

## 2017-07-09 DIAGNOSIS — E785 Hyperlipidemia, unspecified: Secondary | ICD-10-CM | POA: Diagnosis not present

## 2017-07-09 DIAGNOSIS — I350 Nonrheumatic aortic (valve) stenosis: Secondary | ICD-10-CM

## 2017-07-09 DIAGNOSIS — M329 Systemic lupus erythematosus, unspecified: Secondary | ICD-10-CM | POA: Diagnosis not present

## 2017-07-09 DIAGNOSIS — R942 Abnormal results of pulmonary function studies: Secondary | ICD-10-CM

## 2017-07-09 DIAGNOSIS — Z7982 Long term (current) use of aspirin: Secondary | ICD-10-CM | POA: Diagnosis not present

## 2017-07-09 DIAGNOSIS — E8770 Fluid overload, unspecified: Secondary | ICD-10-CM | POA: Diagnosis not present

## 2017-07-09 DIAGNOSIS — D6959 Other secondary thrombocytopenia: Secondary | ICD-10-CM | POA: Diagnosis not present

## 2017-07-09 DIAGNOSIS — I352 Nonrheumatic aortic (valve) stenosis with insufficiency: Secondary | ICD-10-CM | POA: Diagnosis not present

## 2017-07-09 DIAGNOSIS — D62 Acute posthemorrhagic anemia: Secondary | ICD-10-CM | POA: Diagnosis not present

## 2017-07-09 DIAGNOSIS — Z01818 Encounter for other preprocedural examination: Secondary | ICD-10-CM

## 2017-07-09 DIAGNOSIS — Z79899 Other long term (current) drug therapy: Secondary | ICD-10-CM | POA: Diagnosis not present

## 2017-07-09 DIAGNOSIS — I6523 Occlusion and stenosis of bilateral carotid arteries: Secondary | ICD-10-CM

## 2017-07-09 DIAGNOSIS — E871 Hypo-osmolality and hyponatremia: Secondary | ICD-10-CM | POA: Diagnosis not present

## 2017-07-09 DIAGNOSIS — I358 Other nonrheumatic aortic valve disorders: Secondary | ICD-10-CM | POA: Diagnosis not present

## 2017-07-09 DIAGNOSIS — I2510 Atherosclerotic heart disease of native coronary artery without angina pectoris: Secondary | ICD-10-CM | POA: Diagnosis not present

## 2017-07-09 DIAGNOSIS — I10 Essential (primary) hypertension: Secondary | ICD-10-CM | POA: Diagnosis not present

## 2017-07-09 DIAGNOSIS — J9811 Atelectasis: Secondary | ICD-10-CM | POA: Diagnosis not present

## 2017-07-09 DIAGNOSIS — J90 Pleural effusion, not elsewhere classified: Secondary | ICD-10-CM | POA: Diagnosis not present

## 2017-07-09 HISTORY — DX: Psoriasis, unspecified: L40.9

## 2017-07-09 HISTORY — DX: Other fatigue: R53.83

## 2017-07-09 HISTORY — DX: Unspecified osteoarthritis, unspecified site: M19.90

## 2017-07-09 LAB — PULMONARY FUNCTION TEST
DL/VA % pred: 102 %
DL/VA: 4.49 ml/min/mmHg/L
DLCO unc % pred: 98 %
DLCO unc: 26.6 ml/min/mmHg
FEF 25-75 Post: 3.1 L/sec
FEF 25-75 Pre: 3.34 L/sec
FEF2575-%CHANGE-POST: -7 %
FEF2575-%PRED-POST: 114 %
FEF2575-%Pred-Pre: 123 %
FEV1-%Change-Post: 3 %
FEV1-%PRED-POST: 109 %
FEV1-%Pred-Pre: 105 %
FEV1-POST: 3.48 L
FEV1-PRE: 3.37 L
FEV1FVC-%CHANGE-POST: 5 %
FEV1FVC-%Pred-Pre: 106 %
FEV6-%CHANGE-POST: -1 %
FEV6-%PRED-POST: 102 %
FEV6-%Pred-Pre: 104 %
FEV6-POST: 4.08 L
FEV6-Pre: 4.16 L
FEV6FVC-%Change-Post: 0 %
FEV6FVC-%PRED-PRE: 105 %
FEV6FVC-%Pred-Post: 105 %
FVC-%CHANGE-POST: -2 %
FVC-%PRED-POST: 97 %
FVC-%PRED-PRE: 99 %
FVC-POST: 4.08 L
FVC-Pre: 4.17 L
POST FEV1/FVC RATIO: 85 %
POST FEV6/FVC RATIO: 100 %
PRE FEV1/FVC RATIO: 81 %
Pre FEV6/FVC Ratio: 100 %
RV % PRED: 266 %
RV: 5.28 L
TLC % pred: 154 %
TLC: 9.54 L

## 2017-07-09 LAB — SURGICAL PCR SCREEN
MRSA, PCR: NEGATIVE
STAPHYLOCOCCUS AUREUS: NEGATIVE

## 2017-07-09 LAB — URINALYSIS, ROUTINE W REFLEX MICROSCOPIC
BACTERIA UA: NONE SEEN
BILIRUBIN URINE: NEGATIVE
Glucose, UA: NEGATIVE mg/dL
Ketones, ur: NEGATIVE mg/dL
Leukocytes, UA: NEGATIVE
NITRITE: NEGATIVE
PROTEIN: NEGATIVE mg/dL
RBC / HPF: NONE SEEN RBC/hpf (ref 0–5)
SPECIFIC GRAVITY, URINE: 1.008 (ref 1.005–1.030)
Squamous Epithelial / LPF: NONE SEEN
WBC UA: NONE SEEN WBC/hpf (ref 0–5)
pH: 6 (ref 5.0–8.0)

## 2017-07-09 LAB — COMPREHENSIVE METABOLIC PANEL
ALT: 50 U/L (ref 17–63)
ANION GAP: 8 (ref 5–15)
AST: 36 U/L (ref 15–41)
Albumin: 3.9 g/dL (ref 3.5–5.0)
Alkaline Phosphatase: 57 U/L (ref 38–126)
BILIRUBIN TOTAL: 0.5 mg/dL (ref 0.3–1.2)
BUN: 9 mg/dL (ref 6–20)
CO2: 21 mmol/L — ABNORMAL LOW (ref 22–32)
Calcium: 8.6 mg/dL — ABNORMAL LOW (ref 8.9–10.3)
Chloride: 108 mmol/L (ref 101–111)
Creatinine, Ser: 0.69 mg/dL (ref 0.61–1.24)
GFR calc Af Amer: >60 mL/min (ref >60)
Glucose, Bld: 83 mg/dL (ref 65–99)
POTASSIUM: 3.8 mmol/L (ref 3.5–5.1)
Sodium: 137 mmol/L (ref 135–145)
TOTAL PROTEIN: 7 g/dL (ref 6.5–8.1)

## 2017-07-09 LAB — CBC
HEMATOCRIT: 37.5 % — AB (ref 39.0–52.0)
Hemoglobin: 12.5 g/dL — ABNORMAL LOW (ref 13.0–17.0)
MCH: 28.6 pg (ref 26.0–34.0)
MCHC: 33.3 g/dL (ref 30.0–36.0)
MCV: 85.8 fL (ref 78.0–100.0)
Platelets: 149 10*3/uL — ABNORMAL LOW (ref 150–400)
RBC: 4.37 MIL/uL (ref 4.22–5.81)
RDW: 14.7 % (ref 11.5–15.5)
WBC: 4.1 10*3/uL (ref 4.0–10.5)

## 2017-07-09 LAB — BLOOD GAS, ARTERIAL
Acid-base deficit: 0.2 mmol/L (ref 0.0–2.0)
BICARBONATE: 23.5 mmol/L (ref 20.0–28.0)
Drawn by: 421801
FIO2: 21
O2 Saturation: 97 %
PH ART: 7.432 (ref 7.350–7.450)
PO2 ART: 94.3 mmHg (ref 83.0–108.0)
Patient temperature: 98.6
pCO2 arterial: 35.9 mmHg (ref 32.0–48.0)

## 2017-07-09 LAB — TYPE AND SCREEN
ABO/RH(D): O POS
ANTIBODY SCREEN: NEGATIVE

## 2017-07-09 LAB — PROTIME-INR
INR: 1.17
PROTHROMBIN TIME: 14.8 s (ref 11.4–15.2)

## 2017-07-09 LAB — ABO/RH: ABO/RH(D): O POS

## 2017-07-09 LAB — APTT: aPTT: 31 seconds (ref 24–36)

## 2017-07-09 MED ORDER — ALBUTEROL SULFATE (2.5 MG/3ML) 0.083% IN NEBU
2.5000 mg | INHALATION_SOLUTION | Freq: Once | RESPIRATORY_TRACT | Status: AC
  Administered 2017-07-09: 2.5 mg via RESPIRATORY_TRACT

## 2017-07-09 NOTE — Progress Notes (Signed)
Pt. Reports PCP is Dr. Dorris Carnes. Redmon., Dr. Rennis Golden for cardiac. Pt. Is also followed by Dr. Kathi Ludwig for Lupus & a dermatologist for psoriasis. Pt. Reports a headache today, relative to allergies but denies flu/ chest concerns. Dopplers, PFT's, ECHO, EKG, Cardiac cath all completed within a month.

## 2017-07-09 NOTE — Progress Notes (Signed)
Pre-op Cardiac Surgery  Carotid Findings:  Bilateral 1-39% ICA stenosis, antegrade vertebral flow.   Upper Extremity Right Left  Brachial Pressures 179, Tri 176, Tri  Radial Waveforms Tri Tri  Ulnar Waveforms Tri Tri  Palmar Arch (Allen's Test) waveform is unchanged with radial compression and reduced greater than 50% with ulnar compression waveform increases greater than 50% with radial compression and reverses with ulnar compression.    Levin Bacon- RDMS, RVT 10:12 AM  07/09/2017

## 2017-07-09 NOTE — Progress Notes (Signed)
Spoke with Jaynie Collins, PA-C, reported medical history.

## 2017-07-09 NOTE — Progress Notes (Addendum)
Anesthesia Chart Review:  Pt is a 56 year old male scheduled for minimally invasive aortic valve replacement thoracic approach, R mini thoracotomy on 07/12/2017 with Charlett Lango, MD  PMH includes:  Aortic stenosis, connective tissue disease, psoriasis. Never smoker. BMI 26.   Medications include: ASA 81mg , lipitor, plaquenil  BP (!) 148/90   Pulse 77   Temp 36.6 C   Resp 20   Ht 5\' 6"  (1.676 m)   Wt 160 lb 14.4 oz (73 kg)   SpO2 99%   BMI 25.97 kg/m   Preoperative labs reviewed.   - Acceptable for surgery - HbA1c pending.   CXR 07/09/17: normal chest  EKG 06/16/17: NSR. Nonspecific T wave abnormality  PFTs 07/09/17: results pending  Carotid duplex 07/09/17:  - Bilateral 1-39% ICA stenosis, antegrade vertebral flow.  Cardiac cath 06/16/17:   Moderately severe to severe aortic stenosis with mild aortic regurgitation.  Mean gradient 41 mmHg.  Widely patent coronary arteries.  Normal left ventricular systolic function with EF 55% and elevated LVEDP at 20 mmHg consistent with chronic diastolic heart failure.  Moderately  calcified aortic valve on fluoroscopy.  Normal right heart pressures  Echo 06/07/17:  - Left ventricle: The cavity size was normal. There was moderate concentric hypertrophy. Systolic function was normal. The estimated ejection fraction was in the range of 60% to 65%. Wall motion was normal; there were no regional wall motion abnormalities. Features are consistent with a pseudonormal left ventricular filling pattern, with concomitant abnormal relaxation and increased filling pressure (grade 2 diastolic dysfunction). - Aortic valve: Valve mobility was restricted. There was severe stenosis. There was moderate regurgitation. Mean gradient (S): 48 mm Hg. Peak gradient (S): 89 mm Hg. - Mitral valve: There was mild regurgitation. - Left atrium: The atrium was moderately dilated. - Right ventricle: Systolic function was normal. - Tricuspid valve: There  was mild regurgitation. - Pulmonic valve: There was trivial regurgitation.  If CXR and PFT results acceptable, I anticipate pt can proceed as scheduled.   Rica Mast, FNP-BC [facility]/Anesthesiology Phone: [phone] 07/09/2017 3:39 PM

## 2017-07-10 LAB — HEMOGLOBIN A1C
Hgb A1c MFr Bld: 5.6 % (ref 4.8–5.6)
MEAN PLASMA GLUCOSE: 114 mg/dL

## 2017-07-11 MED ORDER — TRANEXAMIC ACID (OHS) BOLUS VIA INFUSION
15.0000 mg/kg | INTRAVENOUS | Status: AC
  Administered 2017-07-12: 1095 mg via INTRAVENOUS
  Filled 2017-07-11: qty 1095

## 2017-07-11 MED ORDER — VANCOMYCIN HCL 10 G IV SOLR
1250.0000 mg | INTRAVENOUS | Status: AC
  Administered 2017-07-12: 1250 mg via INTRAVENOUS
  Filled 2017-07-11: qty 1250

## 2017-07-11 MED ORDER — MAGNESIUM SULFATE 50 % IJ SOLN
40.0000 meq | INTRAMUSCULAR | Status: DC
  Filled 2017-07-11 (×2): qty 9.85

## 2017-07-11 MED ORDER — TRANEXAMIC ACID (OHS) PUMP PRIME SOLUTION
2.0000 mg/kg | INTRAVENOUS | Status: DC
  Filled 2017-07-11: qty 1.46

## 2017-07-11 MED ORDER — METOPROLOL TARTRATE 12.5 MG HALF TABLET
12.5000 mg | ORAL_TABLET | Freq: Once | ORAL | Status: AC
  Administered 2017-07-12: 12.5 mg via ORAL
  Filled 2017-07-11: qty 1

## 2017-07-11 MED ORDER — PAPAVERINE HCL 30 MG/ML IJ SOLN
INTRAMUSCULAR | Status: DC
  Filled 2017-07-11: qty 2.5

## 2017-07-11 MED ORDER — DEXTROSE 5 % IV SOLN
750.0000 mg | INTRAVENOUS | Status: DC
  Filled 2017-07-11: qty 750

## 2017-07-11 MED ORDER — DOPAMINE-DEXTROSE 3.2-5 MG/ML-% IV SOLN
0.0000 ug/kg/min | INTRAVENOUS | Status: DC
  Filled 2017-07-11: qty 250

## 2017-07-11 MED ORDER — SODIUM CHLORIDE 0.9 % IV SOLN
INTRAVENOUS | Status: DC
  Filled 2017-07-11: qty 30

## 2017-07-11 MED ORDER — EPINEPHRINE PF 1 MG/ML IJ SOLN
0.0000 ug/min | INTRAMUSCULAR | Status: DC
Start: 2017-07-12 — End: 2017-07-12
  Filled 2017-07-11: qty 4

## 2017-07-11 MED ORDER — SODIUM CHLORIDE 0.9 % IV SOLN
30.0000 ug/min | INTRAVENOUS | Status: AC
  Administered 2017-07-12: 40 ug/min via INTRAVENOUS
  Filled 2017-07-11: qty 2

## 2017-07-11 MED ORDER — DEXMEDETOMIDINE HCL IN NACL 400 MCG/100ML IV SOLN
0.1000 ug/kg/h | INTRAVENOUS | Status: AC
  Administered 2017-07-12: .5 ug/kg/h via INTRAVENOUS
  Filled 2017-07-11: qty 100

## 2017-07-11 MED ORDER — SODIUM CHLORIDE 0.9 % IV SOLN
1.5000 mg/kg/h | INTRAVENOUS | Status: AC
  Administered 2017-07-12: 1.5 mg/kg/h via INTRAVENOUS
  Filled 2017-07-11: qty 25

## 2017-07-11 MED ORDER — DEXTROSE 5 % IV SOLN
1.5000 g | INTRAVENOUS | Status: AC
  Administered 2017-07-12: .75 g via INTRAVENOUS
  Administered 2017-07-12: 1.5 g via INTRAVENOUS
  Filled 2017-07-11 (×2): qty 1.5

## 2017-07-11 MED ORDER — NITROGLYCERIN IN D5W 200-5 MCG/ML-% IV SOLN
2.0000 ug/min | INTRAVENOUS | Status: DC
  Filled 2017-07-11: qty 250

## 2017-07-11 MED ORDER — POTASSIUM CHLORIDE 2 MEQ/ML IV SOLN
80.0000 meq | INTRAVENOUS | Status: DC
  Filled 2017-07-11: qty 40

## 2017-07-11 MED ORDER — SODIUM CHLORIDE 0.9 % IV SOLN
INTRAVENOUS | Status: AC
  Administered 2017-07-12: .8 [IU]/h via INTRAVENOUS
  Filled 2017-07-11: qty 1

## 2017-07-11 NOTE — Anesthesia Preprocedure Evaluation (Addendum)
Anesthesia Evaluation  Patient identified by MRN, date of birth, ID band Patient awake    Reviewed: Allergy & Precautions, NPO status , Patient's Chart, lab work & pertinent test results  Airway Mallampati: II  TM Distance: >3 FB Neck ROM: Full    Dental  (+) Dental Advisory Given   Pulmonary neg pulmonary ROS,    breath sounds clear to auscultation       Cardiovascular + Valvular Problems/Murmurs (Severe AS, mild AI. LVEF 55%) AS and AI  Rhythm:Regular Rate:Normal + Systolic murmurs    Neuro/Psych negative neurological ROS     GI/Hepatic negative GI ROS, Neg liver ROS,   Endo/Other  negative endocrine ROS  Renal/GU negative Renal ROS     Musculoskeletal  (+) Arthritis ,   Abdominal   Peds  Hematology negative hematology ROS (+)   Anesthesia Other Findings   Reproductive/Obstetrics                            Lab Results  Component Value Date   WBC 4.1 07/09/2017   HGB 12.5 (L) 07/09/2017   HCT 37.5 (L) 07/09/2017   MCV 85.8 07/09/2017   PLT 149 (L) 07/09/2017   Lab Results  Component Value Date   CREATININE 0.69 07/09/2017   BUN 9 07/09/2017   NA 137 07/09/2017   K 3.8 07/09/2017   CL 108 07/09/2017   CO2 21 (L) 07/09/2017    Anesthesia Physical Anesthesia Plan  ASA: IV  Anesthesia Plan: General   Post-op Pain Management:    Induction: Intravenous  PONV Risk Score and Plan: 2 and Ondansetron, Dexamethasone and Treatment may vary due to age or medical condition  Airway Management Planned: Double Lumen EBT  Additional Equipment: Arterial line, CVP, PA Cath, TEE and Ultrasound Guidance Line Placement  Intra-op Plan:   Post-operative Plan: Post-operative intubation/ventilation  Informed Consent: I have reviewed the patients History and Physical, chart, labs and discussed the procedure including the risks, benefits and alternatives for the proposed anesthesia with the  patient or authorized representative who has indicated his/her understanding and acceptance.   Dental advisory given  Plan Discussed with: CRNA  Anesthesia Plan Comments:        Anesthesia Quick Evaluation

## 2017-07-12 ENCOUNTER — Inpatient Hospital Stay (HOSPITAL_COMMUNITY): Payer: Commercial Managed Care - PPO

## 2017-07-12 ENCOUNTER — Inpatient Hospital Stay (HOSPITAL_COMMUNITY): Payer: Commercial Managed Care - PPO | Admitting: Emergency Medicine

## 2017-07-12 ENCOUNTER — Other Ambulatory Visit: Payer: Self-pay

## 2017-07-12 ENCOUNTER — Inpatient Hospital Stay (HOSPITAL_COMMUNITY): Payer: Commercial Managed Care - PPO | Admitting: Anesthesiology

## 2017-07-12 ENCOUNTER — Encounter (HOSPITAL_COMMUNITY): Payer: Self-pay

## 2017-07-12 ENCOUNTER — Inpatient Hospital Stay (HOSPITAL_COMMUNITY)
Admission: RE | Admit: 2017-07-12 | Discharge: 2017-07-18 | DRG: 220 | Disposition: A | Discharge status: Home / Self Care | Payer: Commercial Managed Care - PPO | Source: Ambulatory Visit | Attending: Thoracic Surgery (Cardiothoracic Vascular Surgery) | Admitting: Thoracic Surgery (Cardiothoracic Vascular Surgery)

## 2017-07-12 ENCOUNTER — Encounter (HOSPITAL_COMMUNITY)
Admission: RE | Disposition: A | Discharge status: Home / Self Care | Payer: Self-pay | Source: Ambulatory Visit | Attending: Thoracic Surgery (Cardiothoracic Vascular Surgery)

## 2017-07-12 DIAGNOSIS — Z79899 Other long term (current) drug therapy: Secondary | ICD-10-CM | POA: Diagnosis not present

## 2017-07-12 DIAGNOSIS — I10 Essential (primary) hypertension: Secondary | ICD-10-CM | POA: Diagnosis present

## 2017-07-12 DIAGNOSIS — I352 Nonrheumatic aortic (valve) stenosis with insufficiency: Principal | ICD-10-CM | POA: Diagnosis present

## 2017-07-12 DIAGNOSIS — E785 Hyperlipidemia, unspecified: Secondary | ICD-10-CM | POA: Diagnosis present

## 2017-07-12 DIAGNOSIS — Z7982 Long term (current) use of aspirin: Secondary | ICD-10-CM

## 2017-07-12 DIAGNOSIS — D6959 Other secondary thrombocytopenia: Secondary | ICD-10-CM | POA: Diagnosis present

## 2017-07-12 DIAGNOSIS — I2510 Atherosclerotic heart disease of native coronary artery without angina pectoris: Secondary | ICD-10-CM | POA: Diagnosis present

## 2017-07-12 DIAGNOSIS — I350 Nonrheumatic aortic (valve) stenosis: Secondary | ICD-10-CM

## 2017-07-12 DIAGNOSIS — D62 Acute posthemorrhagic anemia: Secondary | ICD-10-CM | POA: Diagnosis not present

## 2017-07-12 DIAGNOSIS — I358 Other nonrheumatic aortic valve disorders: Secondary | ICD-10-CM | POA: Diagnosis not present

## 2017-07-12 DIAGNOSIS — E8770 Fluid overload, unspecified: Secondary | ICD-10-CM | POA: Diagnosis not present

## 2017-07-12 DIAGNOSIS — J9811 Atelectasis: Secondary | ICD-10-CM | POA: Diagnosis not present

## 2017-07-12 DIAGNOSIS — J90 Pleural effusion, not elsewhere classified: Secondary | ICD-10-CM | POA: Diagnosis not present

## 2017-07-12 DIAGNOSIS — M329 Systemic lupus erythematosus, unspecified: Secondary | ICD-10-CM | POA: Diagnosis present

## 2017-07-12 DIAGNOSIS — Z09 Encounter for follow-up examination after completed treatment for conditions other than malignant neoplasm: Secondary | ICD-10-CM

## 2017-07-12 DIAGNOSIS — Z952 Presence of prosthetic heart valve: Secondary | ICD-10-CM

## 2017-07-12 DIAGNOSIS — E871 Hypo-osmolality and hyponatremia: Secondary | ICD-10-CM | POA: Diagnosis not present

## 2017-07-12 HISTORY — PX: TEE WITHOUT CARDIOVERSION: SHX5443

## 2017-07-12 HISTORY — DX: Nonrheumatic aortic (valve) stenosis: I35.0

## 2017-07-12 LAB — CBC
HEMATOCRIT: 31.5 % — AB (ref 39.0–52.0)
HEMATOCRIT: 28.8 % — AB (ref 39.0–52.0)
HEMOGLOBIN: 9.5 g/dL — AB (ref 13.0–17.0)
Hemoglobin: 10.4 g/dL — ABNORMAL LOW (ref 13.0–17.0)
MCH: 28.2 pg (ref 26.0–34.0)
MCH: 28.2 pg (ref 26.0–34.0)
MCHC: 33 g/dL (ref 30.0–36.0)
MCHC: 33 g/dL (ref 30.0–36.0)
MCV: 85.4 fL (ref 78.0–100.0)
MCV: 85.5 fL (ref 78.0–100.0)
PLATELETS: 68 10*3/uL — AB (ref 150–400)
Platelets: 58 10*3/uL — ABNORMAL LOW (ref 150–400)
RBC: 3.69 MIL/uL — ABNORMAL LOW (ref 4.22–5.81)
RBC: 3.37 MIL/uL — ABNORMAL LOW (ref 4.22–5.81)
RDW: 14.7 % (ref 11.5–15.5)
RDW: 14.9 % (ref 11.5–15.5)
WBC: 7.1 10*3/uL (ref 4.0–10.5)
WBC: 4.8 10*3/uL (ref 4.0–10.5)

## 2017-07-12 LAB — POCT I-STAT 3, ART BLOOD GAS (G3+)
ACID-BASE DEFICIT: 4 mmol/L — AB (ref 0.0–2.0)
ACID-BASE DEFICIT: 4 mmol/L — AB (ref 0.0–2.0)
Acid-base deficit: 4 mmol/L — ABNORMAL HIGH (ref 0.0–2.0)
BICARBONATE: 20.6 mmol/L (ref 20.0–28.0)
BICARBONATE: 21 mmol/L (ref 20.0–28.0)
Bicarbonate: 23.8 mmol/L (ref 20.0–28.0)
Bicarbonate: 20.9 mmol/L (ref 20.0–28.0)
O2 SAT: 100 %
O2 SAT: 99 %
O2 Saturation: 97 %
O2 Saturation: 99 %
PCO2 ART: 37 mmHg (ref 32.0–48.0)
PCO2 ART: 37 mmHg (ref 32.0–48.0)
PH ART: 7.358 (ref 7.350–7.450)
PO2 ART: 123 mmHg — AB (ref 83.0–108.0)
PO2 ART: 127 mmHg — AB (ref 83.0–108.0)
Patient temperature: 36.2
Patient temperature: 36.4
TCO2: 25 mmol/L (ref 22–32)
TCO2: 22 mmol/L (ref 22–32)
TCO2: 22 mmol/L (ref 22–32)
TCO2: 22 mmol/L (ref 22–32)
pCO2 arterial: 36.5 mmHg (ref 32.0–48.0)
pCO2 arterial: 32.6 mmHg (ref 32.0–48.0)
pH, Arterial: 7.423 (ref 7.350–7.450)
pH, Arterial: 7.402 (ref 7.350–7.450)
pH, Arterial: 7.36 (ref 7.350–7.450)
pO2, Arterial: 368 mmHg — ABNORMAL HIGH (ref 83.0–108.0)
pO2, Arterial: 86 mmHg (ref 83.0–108.0)

## 2017-07-12 LAB — POCT I-STAT, CHEM 8
BUN: 9 mg/dL (ref 6–20)
BUN: 7 mg/dL (ref 6–20)
BUN: 8 mg/dL (ref 6–20)
BUN: 8 mg/dL (ref 6–20)
BUN: 8 mg/dL (ref 6–20)
BUN: 8 mg/dL (ref 6–20)
BUN: 10 mg/dL (ref 6–20)
BUN: 12 mg/dL (ref 6–20)
CALCIUM ION: 1.02 mmol/L — AB (ref 1.15–1.40)
CALCIUM ION: 1.04 mmol/L — AB (ref 1.15–1.40)
CALCIUM ION: 1.03 mmol/L — AB (ref 1.15–1.40)
CHLORIDE: 102 mmol/L (ref 101–111)
CHLORIDE: 104 mmol/L (ref 101–111)
CHLORIDE: 104 mmol/L (ref 101–111)
CREATININE: 0.5 mg/dL — AB (ref 0.61–1.24)
CREATININE: 0.5 mg/dL — AB (ref 0.61–1.24)
CREATININE: 0.6 mg/dL — AB (ref 0.61–1.24)
CREATININE: 0.5 mg/dL — AB (ref 0.61–1.24)
CREATININE: 0.7 mg/dL (ref 0.61–1.24)
Calcium, Ion: 1.16 mmol/L (ref 1.15–1.40)
Calcium, Ion: 1.15 mmol/L (ref 1.15–1.40)
Calcium, Ion: 1.23 mmol/L (ref 1.15–1.40)
Calcium, Ion: 1.13 mmol/L — ABNORMAL LOW (ref 1.15–1.40)
Calcium, Ion: 1.15 mmol/L (ref 1.15–1.40)
Chloride: 104 mmol/L (ref 101–111)
Chloride: 102 mmol/L (ref 101–111)
Chloride: 103 mmol/L (ref 101–111)
Chloride: 105 mmol/L (ref 101–111)
Chloride: 104 mmol/L (ref 101–111)
Creatinine, Ser: 0.6 mg/dL — ABNORMAL LOW (ref 0.61–1.24)
Creatinine, Ser: 0.5 mg/dL — ABNORMAL LOW (ref 0.61–1.24)
Creatinine, Ser: 0.6 mg/dL — ABNORMAL LOW (ref 0.61–1.24)
GLUCOSE: 107 mg/dL — AB (ref 65–99)
GLUCOSE: 119 mg/dL — AB (ref 65–99)
GLUCOSE: 185 mg/dL — AB (ref 65–99)
GLUCOSE: 187 mg/dL — AB (ref 65–99)
GLUCOSE: 107 mg/dL — AB (ref 65–99)
Glucose, Bld: 98 mg/dL (ref 65–99)
Glucose, Bld: 201 mg/dL — ABNORMAL HIGH (ref 65–99)
Glucose, Bld: 114 mg/dL — ABNORMAL HIGH (ref 65–99)
HCT: 26 % — ABNORMAL LOW (ref 39.0–52.0)
HCT: 31 % — ABNORMAL LOW (ref 39.0–52.0)
HCT: 25 % — ABNORMAL LOW (ref 39.0–52.0)
HEMATOCRIT: 34 % — AB (ref 39.0–52.0)
HEMATOCRIT: 25 % — AB (ref 39.0–52.0)
HEMATOCRIT: 21 % — AB (ref 39.0–52.0)
HEMATOCRIT: 26 % — AB (ref 39.0–52.0)
HEMATOCRIT: 27 % — AB (ref 39.0–52.0)
HEMOGLOBIN: 10.5 g/dL — AB (ref 13.0–17.0)
HEMOGLOBIN: 8.5 g/dL — AB (ref 13.0–17.0)
HEMOGLOBIN: 7.1 g/dL — AB (ref 13.0–17.0)
Hemoglobin: 11.6 g/dL — ABNORMAL LOW (ref 13.0–17.0)
Hemoglobin: 8.8 g/dL — ABNORMAL LOW (ref 13.0–17.0)
Hemoglobin: 8.5 g/dL — ABNORMAL LOW (ref 13.0–17.0)
Hemoglobin: 8.8 g/dL — ABNORMAL LOW (ref 13.0–17.0)
Hemoglobin: 9.2 g/dL — ABNORMAL LOW (ref 13.0–17.0)
POTASSIUM: 4.1 mmol/L (ref 3.5–5.1)
POTASSIUM: 3.9 mmol/L (ref 3.5–5.1)
POTASSIUM: 4.2 mmol/L (ref 3.5–5.1)
POTASSIUM: 4.6 mmol/L (ref 3.5–5.1)
Potassium: 4.1 mmol/L (ref 3.5–5.1)
Potassium: 4.1 mmol/L (ref 3.5–5.1)
Potassium: 4.9 mmol/L (ref 3.5–5.1)
Potassium: 4.6 mmol/L (ref 3.5–5.1)
SODIUM: 136 mmol/L (ref 135–145)
SODIUM: 137 mmol/L (ref 135–145)
SODIUM: 139 mmol/L (ref 135–145)
Sodium: 138 mmol/L (ref 135–145)
Sodium: 137 mmol/L (ref 135–145)
Sodium: 138 mmol/L (ref 135–145)
Sodium: 138 mmol/L (ref 135–145)
Sodium: 141 mmol/L (ref 135–145)
TCO2: 25 mmol/L (ref 22–32)
TCO2: 24 mmol/L (ref 22–32)
TCO2: 25 mmol/L (ref 22–32)
TCO2: 27 mmol/L (ref 22–32)
TCO2: 24 mmol/L (ref 22–32)
TCO2: 25 mmol/L (ref 22–32)
TCO2: 22 mmol/L (ref 22–32)
TCO2: 28 mmol/L (ref 22–32)

## 2017-07-12 LAB — GLUCOSE, CAPILLARY
GLUCOSE-CAPILLARY: 103 mg/dL — AB (ref 65–99)
Glucose-Capillary: 88 mg/dL (ref 65–99)
Glucose-Capillary: 96 mg/dL (ref 65–99)
Glucose-Capillary: 112 mg/dL — ABNORMAL HIGH (ref 65–99)

## 2017-07-12 LAB — CREATININE, SERUM
Creatinine, Ser: 0.76 mg/dL (ref 0.61–1.24)
GFR calc Af Amer: >60 mL/min (ref >60)
GFR calc non Af Amer: >60 mL/min (ref >60)

## 2017-07-12 LAB — POCT I-STAT 4, (NA,K, GLUC, HGB,HCT)
GLUCOSE: 117 mg/dL — AB (ref 65–99)
HEMATOCRIT: 32 % — AB (ref 39.0–52.0)
HEMOGLOBIN: 10.9 g/dL — AB (ref 13.0–17.0)
Potassium: 4 mmol/L (ref 3.5–5.1)
SODIUM: 143 mmol/L (ref 135–145)

## 2017-07-12 LAB — PLATELET COUNT: Platelets: 89 10*3/uL — ABNORMAL LOW (ref 150–400)

## 2017-07-12 LAB — PROTIME-INR
INR: 1.7
Prothrombin Time: 19.8 seconds — ABNORMAL HIGH (ref 11.4–15.2)

## 2017-07-12 LAB — MAGNESIUM: Magnesium: 3.3 mg/dL — ABNORMAL HIGH (ref 1.7–2.4)

## 2017-07-12 LAB — HEMOGLOBIN AND HEMATOCRIT, BLOOD
HEMATOCRIT: 23.7 % — AB (ref 39.0–52.0)
Hemoglobin: 7.9 g/dL — ABNORMAL LOW (ref 13.0–17.0)

## 2017-07-12 LAB — APTT: aPTT: 46 seconds — ABNORMAL HIGH (ref 24–36)

## 2017-07-12 SURGERY — MINIMALLY INVASIVE AORTIC VALVE REPLACEMENT,THORACIC APPROACH
Anesthesia: General | Site: Chest | Laterality: Right

## 2017-07-12 MED ORDER — LIDOCAINE HCL (CARDIAC) 20 MG/ML IV SOLN
INTRAVENOUS | Status: DC | PRN
  Administered 2017-07-12: 80 mg via INTRAVENOUS

## 2017-07-12 MED ORDER — FENTANYL CITRATE (PF) 100 MCG/2ML IJ SOLN
50.0000 ug | INTRAMUSCULAR | Status: DC | PRN
  Administered 2017-07-12: 50 ug via INTRAVENOUS
  Administered 2017-07-12 – 2017-07-13 (×5): 100 ug via INTRAVENOUS
  Administered 2017-07-13: 50 ug via INTRAVENOUS
  Administered 2017-07-13: 100 ug via INTRAVENOUS
  Administered 2017-07-14: 50 ug via INTRAVENOUS
  Filled 2017-07-12 (×9): qty 2

## 2017-07-12 MED ORDER — METOPROLOL TARTRATE 5 MG/5ML IV SOLN
2.5000 mg | INTRAVENOUS | Status: DC | PRN
  Administered 2017-07-16 – 2017-07-18 (×2): 5 mg via INTRAVENOUS
  Filled 2017-07-12 (×3): qty 5

## 2017-07-12 MED ORDER — SODIUM CHLORIDE 0.9 % IV SOLN
INTRAVENOUS | Status: DC
  Administered 2017-07-12: 18:00:00 via INTRAVENOUS

## 2017-07-12 MED ORDER — SODIUM CHLORIDE 0.9% FLUSH
3.0000 mL | Freq: Two times a day (BID) | INTRAVENOUS | Status: DC
  Administered 2017-07-13 – 2017-07-17 (×7): 3 mL via INTRAVENOUS

## 2017-07-12 MED ORDER — SODIUM CHLORIDE 0.45 % IV SOLN
INTRAVENOUS | Status: DC | PRN
  Administered 2017-07-12: 15:00:00 via INTRAVENOUS

## 2017-07-12 MED ORDER — FAMOTIDINE IN NACL 20-0.9 MG/50ML-% IV SOLN: 20.0000 mg | Freq: Two times a day (BID) | INTRAVENOUS | Status: DC

## 2017-07-12 MED ORDER — ORAL CARE MOUTH RINSE
15.0000 mL | OROMUCOSAL | Status: DC
  Administered 2017-07-12: 15 mL via OROMUCOSAL

## 2017-07-12 MED ORDER — CHLORHEXIDINE GLUCONATE 0.12 % MT SOLN
15.0000 mL | OROMUCOSAL | Status: AC
  Administered 2017-07-12: 15 mL via OROMUCOSAL

## 2017-07-12 MED ORDER — CHLORHEXIDINE GLUCONATE 4 % EX LIQD: 30.0000 mL | CUTANEOUS | Status: DC

## 2017-07-12 MED ORDER — KETOROLAC TROMETHAMINE 30 MG/ML IJ SOLN: 30.0000 mg | Freq: Once | INTRAMUSCULAR | Status: AC | PRN

## 2017-07-12 MED ORDER — ASPIRIN EC 325 MG PO TBEC
325.0000 mg | DELAYED_RELEASE_TABLET | Freq: Every day | ORAL | Status: DC
  Administered 2017-07-13 – 2017-07-14 (×2): 325 mg via ORAL
  Filled 2017-07-12 (×2): qty 1

## 2017-07-12 MED ORDER — METOPROLOL TARTRATE 12.5 MG HALF TABLET: 12.5000 mg | ORAL_TABLET | Freq: Two times a day (BID) | ORAL | Status: DC

## 2017-07-12 MED ORDER — SUCCINYLCHOLINE CHLORIDE 200 MG/10ML IV SOSY
PREFILLED_SYRINGE | INTRAVENOUS | Status: AC
  Filled 2017-07-12: qty 10

## 2017-07-12 MED ORDER — LACTATED RINGERS IV SOLN
INTRAVENOUS | Status: DC | PRN
  Administered 2017-07-12: 07:00:00 via INTRAVENOUS

## 2017-07-12 MED ORDER — KENNESTONE BLOOD CARDIOPLEGIA (KBC) MANNITOL SYRINGE (20%, 32ML)
32.0000 mL | Freq: Once | INTRAVENOUS | Status: DC
  Filled 2017-07-12: qty 32

## 2017-07-12 MED ORDER — DEXAMETHASONE SODIUM PHOSPHATE 10 MG/ML IJ SOLN
INTRAMUSCULAR | Status: AC
  Filled 2017-07-12: qty 1

## 2017-07-12 MED ORDER — BISACODYL 10 MG RE SUPP: 10.0000 mg | Freq: Every day | RECTAL | Status: DC

## 2017-07-12 MED ORDER — ONDANSETRON HCL 4 MG/2ML IJ SOLN
4.0000 mg | Freq: Four times a day (QID) | INTRAMUSCULAR | Status: DC | PRN
  Administered 2017-07-13 – 2017-07-16 (×5): 4 mg via INTRAVENOUS
  Filled 2017-07-12 (×6): qty 2

## 2017-07-12 MED ORDER — CHLORHEXIDINE GLUCONATE 0.12% ORAL RINSE (MEDLINE KIT)
15.0000 mL | Freq: Two times a day (BID) | OROMUCOSAL | Status: DC
Start: 2017-07-12 — End: 2017-07-12

## 2017-07-12 MED ORDER — SODIUM CHLORIDE 0.9% FLUSH: 3.0000 mL | INTRAVENOUS | Status: DC | PRN

## 2017-07-12 MED ORDER — KENNESTONE BLOOD CARDIOPLEGIA VIAL
13.0000 mL | Freq: Once | Status: DC
  Filled 2017-07-12: qty 13

## 2017-07-12 MED ORDER — DOCUSATE SODIUM 100 MG PO CAPS
200.0000 mg | ORAL_CAPSULE | Freq: Every day | ORAL | Status: DC
  Administered 2017-07-13 – 2017-07-18 (×4): 200 mg via ORAL
  Filled 2017-07-12 (×4): qty 2

## 2017-07-12 MED ORDER — 0.9 % SODIUM CHLORIDE (POUR BTL) OPTIME
TOPICAL | Status: DC | PRN
  Administered 2017-07-12: 1000 mL
  Administered 2017-07-12: 5000 mL

## 2017-07-12 MED ORDER — SODIUM CHLORIDE 0.9 % IV SOLN
INTRAVENOUS | Status: DC
  Administered 2017-07-12: 1.1 [IU]/h via INTRAVENOUS
  Filled 2017-07-12: qty 1

## 2017-07-12 MED ORDER — VANCOMYCIN HCL IN DEXTROSE 1-5 GM/200ML-% IV SOLN
1000.0000 mg | Freq: Once | INTRAVENOUS | Status: AC
  Administered 2017-07-12: 1000 mg via INTRAVENOUS
  Filled 2017-07-12: qty 200

## 2017-07-12 MED ORDER — EPHEDRINE SULFATE 50 MG/ML IJ SOLN
INTRAMUSCULAR | Status: DC | PRN
  Administered 2017-07-12: 10 mg via INTRAVENOUS

## 2017-07-12 MED ORDER — CHLORHEXIDINE GLUCONATE 0.12 % MT SOLN
15.0000 mL | Freq: Once | OROMUCOSAL | Status: AC
  Administered 2017-07-12: 15 mL via OROMUCOSAL
  Filled 2017-07-12: qty 15

## 2017-07-12 MED ORDER — ASPIRIN 81 MG PO CHEW: 324.0000 mg | CHEWABLE_TABLET | Freq: Every day | ORAL | Status: DC

## 2017-07-12 MED ORDER — ACETAMINOPHEN 160 MG/5ML PO SOLN: 650.0000 mg | Freq: Once | ORAL | Status: AC

## 2017-07-12 MED ORDER — FENTANYL CITRATE (PF) 250 MCG/5ML IJ SOLN
INTRAMUSCULAR | Status: DC | PRN
  Administered 2017-07-12: 100 ug via INTRAVENOUS
  Administered 2017-07-12: 150 ug via INTRAVENOUS
  Administered 2017-07-12 (×3): 250 ug via INTRAVENOUS
  Administered 2017-07-12: 100 ug via INTRAVENOUS
  Administered 2017-07-12: 150 ug via INTRAVENOUS
  Administered 2017-07-12: 250 ug via INTRAVENOUS

## 2017-07-12 MED ORDER — CEFUROXIME SODIUM 1.5 G IV SOLR
1.5000 g | Freq: Two times a day (BID) | INTRAVENOUS | Status: AC
  Administered 2017-07-13 – 2017-07-14 (×4): 1.5 g via INTRAVENOUS
  Filled 2017-07-12 (×4): qty 1.5

## 2017-07-12 MED ORDER — MAGNESIUM SULFATE 4 GM/100ML IV SOLN
4.0000 g | Freq: Once | INTRAVENOUS | Status: AC
  Administered 2017-07-12: 4 g via INTRAVENOUS
  Filled 2017-07-12: qty 100

## 2017-07-12 MED ORDER — ROCURONIUM BROMIDE 10 MG/ML (PF) SYRINGE
PREFILLED_SYRINGE | INTRAVENOUS | Status: DC | PRN
  Administered 2017-07-12: 70 mg via INTRAVENOUS
  Administered 2017-07-12 (×3): 30 mg via INTRAVENOUS

## 2017-07-12 MED ORDER — ALBUMIN HUMAN 5 % IV SOLN
25.0000 g | Freq: Once | INTRAVENOUS | Status: AC
  Administered 2017-07-13: 25 g via INTRAVENOUS
  Filled 2017-07-12: qty 500

## 2017-07-12 MED ORDER — SODIUM CHLORIDE 0.9 % IV SOLN: 250.0000 mL | INTRAVENOUS | Status: DC

## 2017-07-12 MED ORDER — METOPROLOL TARTRATE 25 MG/10 ML ORAL SUSPENSION: 12.5000 mg | Freq: Two times a day (BID) | ORAL | Status: DC

## 2017-07-12 MED ORDER — LACTATED RINGERS IV SOLN: INTRAVENOUS | Status: DC

## 2017-07-12 MED ORDER — ACETAMINOPHEN 500 MG PO TABS
1000.0000 mg | ORAL_TABLET | Freq: Four times a day (QID) | ORAL | Status: AC
  Administered 2017-07-13 – 2017-07-17 (×17): 1000 mg via ORAL
  Filled 2017-07-12 (×17): qty 2

## 2017-07-12 MED ORDER — INSULIN REGULAR BOLUS VIA INFUSION
0.0000 [IU] | Freq: Three times a day (TID) | INTRAVENOUS | Status: DC
  Filled 2017-07-12: qty 10

## 2017-07-12 MED ORDER — POTASSIUM CHLORIDE 10 MEQ/50ML IV SOLN: 10.0000 meq | INTRAVENOUS | Status: AC

## 2017-07-12 MED ORDER — PANTOPRAZOLE SODIUM 40 MG PO TBEC
40.0000 mg | DELAYED_RELEASE_TABLET | Freq: Every day | ORAL | Status: DC
  Administered 2017-07-14 – 2017-07-18 (×5): 40 mg via ORAL
  Filled 2017-07-12 (×5): qty 1

## 2017-07-12 MED ORDER — TRAMADOL HCL 50 MG PO TABS
50.0000 mg | ORAL_TABLET | ORAL | Status: DC | PRN
  Administered 2017-07-13 (×2): 100 mg via ORAL
  Administered 2017-07-13: 50 mg via ORAL
  Administered 2017-07-14: 100 mg via ORAL
  Administered 2017-07-14: 50 mg via ORAL
  Administered 2017-07-15 (×2): 100 mg via ORAL
  Filled 2017-07-12 (×2): qty 2
  Filled 2017-07-12: qty 1
  Filled 2017-07-12 (×2): qty 2
  Filled 2017-07-12: qty 1
  Filled 2017-07-12: qty 2

## 2017-07-12 MED ORDER — ONDANSETRON HCL 4 MG/2ML IJ SOLN
INTRAMUSCULAR | Status: AC
  Filled 2017-07-12: qty 2

## 2017-07-12 MED ORDER — SODIUM CHLORIDE 0.9 % IV SOLN
0.0000 ug/kg/h | INTRAVENOUS | Status: DC
  Filled 2017-07-12: qty 2

## 2017-07-12 MED ORDER — MIDAZOLAM HCL 2 MG/2ML IJ SOLN: 2.0000 mg | INTRAMUSCULAR | Status: DC | PRN

## 2017-07-12 MED ORDER — CALCIUM CHLORIDE 10 % IV SOLN
INTRAVENOUS | Status: DC | PRN
  Administered 2017-07-12: 300 mg via INTRAVENOUS

## 2017-07-12 MED ORDER — HEMOSTATIC AGENTS (NO CHARGE) OPTIME
TOPICAL | Status: DC | PRN
  Administered 2017-07-12 (×2): 1 via TOPICAL

## 2017-07-12 MED ORDER — LACTATED RINGERS IV SOLN
500.0000 mL | Freq: Once | INTRAVENOUS | Status: DC | PRN
Start: 2017-07-12 — End: 2017-07-17

## 2017-07-12 MED ORDER — PHENYLEPHRINE 40 MCG/ML (10ML) SYRINGE FOR IV PUSH (FOR BLOOD PRESSURE SUPPORT)
PREFILLED_SYRINGE | INTRAVENOUS | Status: AC
  Filled 2017-07-12: qty 10

## 2017-07-12 MED ORDER — LACTATED RINGERS IV SOLN
INTRAVENOUS | Status: DC
  Administered 2017-07-13: via INTRAVENOUS

## 2017-07-12 MED ORDER — ACETAMINOPHEN 160 MG/5ML PO SOLN: 1000.0000 mg | Freq: Four times a day (QID) | ORAL | Status: AC

## 2017-07-12 MED ORDER — SODIUM CHLORIDE 0.9 % IJ SOLN
INTRAMUSCULAR | Status: AC
  Filled 2017-07-12: qty 10

## 2017-07-12 MED ORDER — LIDOCAINE 2% (20 MG/ML) 5 ML SYRINGE
INTRAMUSCULAR | Status: AC
  Filled 2017-07-12: qty 5

## 2017-07-12 MED ORDER — ALBUMIN HUMAN 5 % IV SOLN
250.0000 mL | INTRAVENOUS | Status: AC | PRN
  Administered 2017-07-12 (×3): 250 mL via INTRAVENOUS
  Filled 2017-07-12 (×2): qty 250

## 2017-07-12 MED ORDER — NITROGLYCERIN IN D5W 200-5 MCG/ML-% IV SOLN
0.0000 ug/min | INTRAVENOUS | Status: DC
  Administered 2017-07-13: 60 ug/min via INTRAVENOUS
  Filled 2017-07-12: qty 250

## 2017-07-12 MED ORDER — MIDAZOLAM HCL 5 MG/5ML IJ SOLN
INTRAMUSCULAR | Status: DC | PRN
  Administered 2017-07-12 (×4): 2 mg via INTRAVENOUS

## 2017-07-12 MED ORDER — ACETAMINOPHEN 650 MG RE SUPP
650.0000 mg | Freq: Once | RECTAL | Status: AC
  Administered 2017-07-12: 650 mg via RECTAL

## 2017-07-12 MED ORDER — PROTAMINE SULFATE 10 MG/ML IV SOLN
INTRAVENOUS | Status: DC | PRN
  Administered 2017-07-12: 220 mg via INTRAVENOUS

## 2017-07-12 MED ORDER — BISACODYL 5 MG PO TBEC
10.0000 mg | DELAYED_RELEASE_TABLET | Freq: Every day | ORAL | Status: DC
  Administered 2017-07-13 – 2017-07-14 (×2): 10 mg via ORAL
  Filled 2017-07-12 (×2): qty 2

## 2017-07-12 MED ORDER — FENTANYL CITRATE (PF) 250 MCG/5ML IJ SOLN
INTRAMUSCULAR | Status: AC
  Filled 2017-07-12: qty 25

## 2017-07-12 MED ORDER — EPHEDRINE 5 MG/ML INJ
INTRAVENOUS | Status: AC
  Filled 2017-07-12: qty 10

## 2017-07-12 MED ORDER — DEXAMETHASONE SODIUM PHOSPHATE 10 MG/ML IJ SOLN
INTRAMUSCULAR | Status: DC | PRN
  Administered 2017-07-12: 10 mg via INTRAVENOUS

## 2017-07-12 MED ORDER — ROCURONIUM BROMIDE 10 MG/ML (PF) SYRINGE
PREFILLED_SYRINGE | INTRAVENOUS | Status: AC
  Filled 2017-07-12: qty 10

## 2017-07-12 MED ORDER — FENTANYL CITRATE (PF) 250 MCG/5ML IJ SOLN
INTRAMUSCULAR | Status: AC
  Filled 2017-07-12: qty 5

## 2017-07-12 MED ORDER — PROPOFOL 10 MG/ML IV BOLUS
INTRAVENOUS | Status: AC
  Filled 2017-07-12: qty 40

## 2017-07-12 MED ORDER — SODIUM CHLORIDE 0.9 % IJ SOLN
INTRAMUSCULAR | Status: DC | PRN
  Administered 2017-07-12 (×2): via TOPICAL

## 2017-07-12 MED ORDER — ALBUMIN HUMAN 5 % IV SOLN
INTRAVENOUS | Status: DC | PRN
  Administered 2017-07-12: 13:00:00 via INTRAVENOUS

## 2017-07-12 MED ORDER — MIDAZOLAM HCL 10 MG/2ML IJ SOLN
INTRAMUSCULAR | Status: AC
  Filled 2017-07-12: qty 2

## 2017-07-12 MED ORDER — PHENYLEPHRINE HCL 10 MG/ML IJ SOLN
0.0000 ug/min | INTRAMUSCULAR | Status: DC
  Filled 2017-07-12: qty 2

## 2017-07-12 SURGICAL SUPPLY — 111 items
ADAPTER CARDIO PERF ANTE/RETRO (ADAPTER) ×4 IMPLANT
ADAPTER DLP PERFUSION .25INX2I (MISCELLANEOUS) ×1 IMPLANT
ADH SKN CLS APL DERMABOND .7 (GAUZE/BANDAGES/DRESSINGS)
ADPR CRDPLG .25X.64 STRL (MISCELLANEOUS) ×2
ADPR PRFSN 84XANTGRD RTRGD (ADAPTER) ×4
BAG DECANTER FOR FLEXI CONT (MISCELLANEOUS) ×3 IMPLANT
BATTERY MAXDRIVER (MISCELLANEOUS) ×1 IMPLANT
BLADE CLIPPER SURG (BLADE) ×1 IMPLANT
BLADE SURG 11 STRL SS (BLADE) ×3 IMPLANT
BLADE SURG 15 STRL LF DISP TIS (BLADE) IMPLANT
BLADE SURG 15 STRL SS (BLADE) ×3
CANISTER SUCT 3000ML PPV (MISCELLANEOUS) ×3 IMPLANT
CANNULA FEM VENOUS REMOTE 22FR (CANNULA) ×1 IMPLANT
CANNULA GUNDRY RCSP 15FR (MISCELLANEOUS) ×4 IMPLANT
CANNULA OPTISITE PERFUSION 16F (CANNULA) IMPLANT
CANNULA OPTISITE PERFUSION 18F (CANNULA) ×1 IMPLANT
CANNULA SUMP PERICARDIAL (CANNULA) ×3 IMPLANT
CATH ENDOVENT PULMONARY (CATHETERS) IMPLANT
CATH HEART VENT LEFT (CATHETERS) ×2 IMPLANT
CATH RETROPLEGIA CORONARY 14FR (CATHETERS) ×1 IMPLANT
CELLS DAT CNTRL 66122 CELL SVR (MISCELLANEOUS) IMPLANT
CONN ST 1/4X3/8  BEN (MISCELLANEOUS) ×2
CONN ST 1/4X3/8 BEN (MISCELLANEOUS) ×4 IMPLANT
CONNECTOR 1/2X3/8X1/2 3 WAY (MISCELLANEOUS) ×2
CONNECTOR 1/2X3/8X1/2 3WAY (MISCELLANEOUS) ×2 IMPLANT
CONT SPEC 4OZ CLIKSEAL STRL BL (MISCELLANEOUS) ×6 IMPLANT
COVER BACK TABLE 24X17X13 BIG (DRAPES) ×3 IMPLANT
COVER PROBE W GEL 5X96 (DRAPES) ×1 IMPLANT
CRADLE DONUT ADULT HEAD (MISCELLANEOUS) ×3 IMPLANT
DERMABOND ADVANCED (GAUZE/BANDAGES/DRESSINGS)
DERMABOND ADVANCED .7 DNX12 (GAUZE/BANDAGES/DRESSINGS) ×4 IMPLANT
DEVICE PMI PUNCTURE CLOSURE (MISCELLANEOUS) ×2 IMPLANT
DEVICE TROCAR PUNCTURE CLOSURE (ENDOMECHANICALS) ×3 IMPLANT
DRAIN CHANNEL 32F RND 10.7 FF (WOUND CARE) ×6 IMPLANT
DRAPE BILATERAL SPLIT (DRAPES) ×3 IMPLANT
DRAPE CV SPLIT W-CLR ANES SCRN (DRAPES) ×3 IMPLANT
DRAPE INCISE IOBAN 66X45 STRL (DRAPES) ×8 IMPLANT
DRAPE SLUSH/WARMER DISC (DRAPES) ×3 IMPLANT
ELECT BLADE 4.0 EZ CLEAN MEGAD (MISCELLANEOUS)
ELECT BLADE 6.5 EXT (BLADE) ×3 IMPLANT
ELECT REM PT RETURN 9FT ADLT (ELECTROSURGICAL) ×6
ELECTRODE BLDE 4.0 EZ CLN MEGD (MISCELLANEOUS) ×2 IMPLANT
ELECTRODE REM PT RTRN 9FT ADLT (ELECTROSURGICAL) ×4 IMPLANT
FELT TEFLON 1X6 (MISCELLANEOUS) ×6 IMPLANT
FEMORAL VENOUS CANN RAP (CANNULA) IMPLANT
GAUZE SPONGE 4X4 12PLY STRL (GAUZE/BANDAGES/DRESSINGS) ×3 IMPLANT
GAUZE SPONGE 4X4 12PLY STRL LF (GAUZE/BANDAGES/DRESSINGS) ×1 IMPLANT
GLOVE BIO SURGEON STRL SZ 6.5 (GLOVE) ×1 IMPLANT
GLOVE BIO SURGEON STRL SZ7.5 (GLOVE) ×1 IMPLANT
GLOVE INDICATOR 8.5 STRL (GLOVE) ×2 IMPLANT
GLOVE ORTHO TXT STRL SZ7.5 (GLOVE) ×6 IMPLANT
GOWN STRL REUS W/ TWL LRG LVL3 (GOWN DISPOSABLE) ×8 IMPLANT
GOWN STRL REUS W/TWL LRG LVL3 (GOWN DISPOSABLE) ×30
GRASPER SUT TROCAR 14GX15 (MISCELLANEOUS) ×1 IMPLANT
HEMOSTAT POWDER SURGIFOAM 1G (HEMOSTASIS) ×2 IMPLANT
IV CATH 18G X1.75 CATHLON (IV SOLUTION) ×1 IMPLANT
KIT BASIN OR (CUSTOM PROCEDURE TRAY) ×3 IMPLANT
KIT CATH SUCT 8FR (CATHETERS) ×2 IMPLANT
KIT DILATOR VASC 18G NDL (KITS) ×3 IMPLANT
KIT DRAINAGE VACCUM ASSIST (KITS) ×1 IMPLANT
KIT ROOM TURNOVER OR (KITS) ×3 IMPLANT
KIT SUCTION CATH 14FR (SUCTIONS) ×3 IMPLANT
KIT SUT CK MINI COMBO 4X17 (Prosthesis & Implant Heart) ×1 IMPLANT
LEAD PACING MYOCARDI (MISCELLANEOUS) ×4 IMPLANT
LINE VENT (MISCELLANEOUS) ×1 IMPLANT
NDL AORTIC ROOT 14G 7F (CATHETERS) ×2 IMPLANT
NEEDLE AORTIC ROOT 14G 7F (CATHETERS) ×6 IMPLANT
NS IRRIG 1000ML POUR BTL (IV SOLUTION) ×15 IMPLANT
PACK OPEN HEART (CUSTOM PROCEDURE TRAY) ×3 IMPLANT
PAD ARMBOARD 7.5X6 YLW CONV (MISCELLANEOUS) ×6 IMPLANT
PAD ELECT DEFIB RADIOL ZOLL (MISCELLANEOUS) ×3 IMPLANT
PLATE RIB X SHAPE 20HOLE (Plate) ×1 IMPLANT
RETRACTOR WND ALEXIS 18 MED (MISCELLANEOUS) ×2 IMPLANT
RTRCTR WOUND ALEXIS 18CM MED (MISCELLANEOUS)
SCREW RIB MAXDRIVE 2.3X7 (Screw) IMPLANT
SCREW RIB MAXDRIVE 2.3X7MM (Screw) ×9 IMPLANT
SET CANNULATION TOURNIQUET (MISCELLANEOUS) ×3 IMPLANT
SET CARDIOPLEGIA MPS 5001102 (MISCELLANEOUS) ×1 IMPLANT
SET IRRIG TUBING LAPAROSCOPIC (IRRIGATION / IRRIGATOR) ×4 IMPLANT
SOLUTION ANTI FOG 6CC (MISCELLANEOUS) ×3 IMPLANT
SUT BONE WAX W31G (SUTURE) ×3 IMPLANT
SUT ETHIBOND 2 0 SH (SUTURE) ×6
SUT ETHIBOND 2 0 SH 36X2 (SUTURE) IMPLANT
SUT ETHIBOND X763 2 0 SH 1 (SUTURE) ×7 IMPLANT
SUT GORETEX CV 4 TH 22 36 (SUTURE) ×3 IMPLANT
SUT GORETEX CV4 TH-18 (SUTURE) ×6 IMPLANT
SUT PROLENE 3 0 SH1 36 (SUTURE) ×5 IMPLANT
SUT PROLENE 4 0 RB 1 (SUTURE) ×24
SUT PROLENE 4 0 SH DA (SUTURE) ×3 IMPLANT
SUT PROLENE 4-0 RB1 .5 CRCL 36 (SUTURE) IMPLANT
SUT PROLENE 5 0 C 1 36 (SUTURE) ×5 IMPLANT
SUT SILK 2 0 SH CR/8 (SUTURE) ×2 IMPLANT
SUT TEM PAC WIRE 2 0 SH (SUTURE) ×8 IMPLANT
SUT VIC AB 1 CTX 36 (SUTURE) ×3
SUT VIC AB 1 CTX36XBRD ANBCTR (SUTURE) IMPLANT
SUT VIC AB 2-0 CT1 27 (SUTURE) ×3
SUT VIC AB 2-0 CT1 TAPERPNT 27 (SUTURE) IMPLANT
SUT VIC AB 3-0 X1 27 (SUTURE) ×2 IMPLANT
SYSTEM SAHARA CHEST DRAIN ATS (WOUND CARE) ×3 IMPLANT
TAPE CLOTH SURG 4X10 WHT LF (GAUZE/BANDAGES/DRESSINGS) ×1 IMPLANT
TAPE PAPER 2X10 WHT MICROPORE (GAUZE/BANDAGES/DRESSINGS) ×1 IMPLANT
TOWEL GREEN STERILE FF (TOWEL DISPOSABLE) ×5 IMPLANT
TRAY FOLEY SILVER 16FR TEMP (SET/KITS/TRAYS/PACK) ×3 IMPLANT
TROCAR XCEL BLADELESS 5X75MML (TROCAR) IMPLANT
TROCAR XCEL NON-BLD 11X100MML (ENDOMECHANICALS) ×6 IMPLANT
TUBE SUCT INTRACARD DLP 20F (MISCELLANEOUS) ×3 IMPLANT
UNDERPAD 30X30 (UNDERPADS AND DIAPERS) ×3 IMPLANT
VALVE SYSTEM EDWS INTUITY 21A (Prosthesis & Implant Heart) ×1 IMPLANT
VENT LEFT HEART 12002 (CATHETERS) ×3
WATER STERILE IRR 1000ML POUR (IV SOLUTION) ×7 IMPLANT
WIRE .035 3MM-J 145CM (WIRE) ×1 IMPLANT

## 2017-07-12 NOTE — Anesthesia Procedure Notes (Signed)
Central Venous Catheter Insertion Performed by: Marcene Duos, MD, anesthesiologist Start/End12/17/2018 7:00 AM, 07/12/2017 7:20 AM Patient location: Pre-op. Preanesthetic checklist: patient identified, IV checked, site marked, risks and benefits discussed, surgical consent, monitors and equipment checked, pre-op evaluation, timeout performed and anesthesia consent Position: Trendelenburg Lidocaine 1% used for infiltration and patient sedated Hand hygiene performed , maximum sterile barriers used  and Seldinger technique used Catheter size: 8.5 Fr Total catheter length 10. Central line and PA cath was placed.Sheath introducer Swan type:thermodilution PA Cath depth:40 Procedure performed using ultrasound guided technique. Ultrasound Notes:anatomy identified, needle tip was noted to be adjacent to the nerve/plexus identified, no ultrasound evidence of intravascular and/or intraneural injection and image(s) printed for medical record Attempts: 2 (unable to pass wire on left side. converted to right sided line) Following insertion, line sutured, dressing applied and Biopatch. Post procedure assessment: blood return through all ports, free fluid flow and no air  Patient tolerated the procedure well with no immediate complications.

## 2017-07-12 NOTE — Plan of Care (Signed)
  Completed/Met Respiratory: Respiratory status will improve 07/12/2017 1838 - Completed/Met by Netta Corrigan, RN Note Pt extubated within the 6 hour window and is tolerating 4L [place] well.

## 2017-07-12 NOTE — Anesthesia Procedure Notes (Signed)
Arterial Line Insertion Start/End12/17/2018 6:45 AM Performed by: Coralee Rud, CRNA, CRNA  Lidocaine 1% used for infiltration Left, radial was placed Catheter size: 20 G Hand hygiene performed  and maximum sterile barriers used   Attempts: 1 Procedure performed without using ultrasound guided technique. Following insertion, dressing applied and Biopatch. Post procedure assessment: unchanged  Patient tolerated the procedure well with no immediate complications.

## 2017-07-12 NOTE — Brief Op Note (Addendum)
07/12/2017  12:22 PM  PATIENT:  Luis Ferguson  56 y.o. male  PRE-OPERATIVE DIAGNOSIS:  Severe Aortic Stenosis  POST-OPERATIVE DIAGNOSIS:  Severe Aortic Stenosis  PROCEDURE:  Procedure(s): MINIMALLY INVASIVE AORTIC VALVE REPLACEMENT,THORACIC APPROACH, RIGHT MINI THORACOTOMY, TEE (Right) TRANSESOPHAGEAL ECHOCARDIOGRAM (TEE) (N/A) #21 INTUITY SUTURELESS AVR (Model # 8300AB, serial # [id number])  SURGEON:  Surgeon(s) and Role:    * [name], MD - Primary  PHYSICIAN ASSISTANT: WAYNE GOLD PA-C  ANESTHESIA:   general  EBL:  100 mL   BLOOD ADMINISTERED:none  DRAINS: MEDIASTINAL AND PLEURAL CHEST DRAINS   LOCAL MEDICATIONS USED:  NONE  SPECIMEN:  Source of Specimen:  AORTIC VALVE LEAFLETS  DISPOSITION OF SPECIMEN:  PATHOLOGY  COUNTS:  YES  TOURNIQUET:  * No tourniquets in log *  DICTATION: .Other Dictation: Dictation Number PENDING  PLAN OF CARE: Admit to inpatient   PATIENT DISPOSITION:  ICU - intubated and hemodynamically stable.   Delay start of Pharmacological VTE agent (>24hrs) due to surgical blood loss or risk of bleeding: yes  COMPLICATIONS: NO KNOWN   Initially no paravalvular leak by echo. Just prior to leaving OR, small PVL noted  Xc= 83 min CPB= 159 min

## 2017-07-12 NOTE — Anesthesia Procedure Notes (Signed)
Central Venous Catheter Insertion Performed by: Marcene Duos, MD, anesthesiologist Start/End12/17/2018 7:00 AM, 07/12/2017 7:20 AM Patient location: Pre-op. Preanesthetic checklist: patient identified, IV checked, site marked, risks and benefits discussed, surgical consent, monitors and equipment checked, pre-op evaluation, timeout performed and anesthesia consent Hand hygiene performed  and maximum sterile barriers used  PA cath was placed.Swan type:thermodilution Procedure performed without using ultrasound guided technique. Attempts: 1 Patient tolerated the procedure well with no immediate complications.

## 2017-07-12 NOTE — Transfer of Care (Signed)
Immediate Anesthesia Transfer of Care Note  Patient: Luis Ferguson  Procedure(s) Performed: MINIMALLY INVASIVE AORTIC VALVE REPLACEMENT,THORACIC APPROACH, RIGHT MINI THORACOTOMY, TEE (Right Chest) TRANSESOPHAGEAL ECHOCARDIOGRAM (TEE) (N/A )  Patient Location: SICU  Anesthesia Type:General  Level of Consciousness: Patient remains intubated per anesthesia plan  Airway & Oxygen Therapy: Patient remains intubated per anesthesia plan and Patient placed on Ventilator (see vital sign flow sheet for setting)  Post-op Assessment: Report given to RN and Post -op Vital signs reviewed and stable  Post vital signs: Reviewed and stable  Last Vitals:  Vitals:   07/12/17 0608 07/12/17 0631  BP: (!) 173/97 (!) 168/94  Pulse:  87  Resp:    Temp:    SpO2:      Last Pain:  Vitals:   07/12/17 0628  TempSrc:   PainSc: 0-No pain      Patients Stated Pain Goal: 3 (07/12/17 4388)  Complications: No apparent anesthesia complications

## 2017-07-12 NOTE — Interval H&P Note (Signed)
History and Physical Interval Note:  07/12/2017 7:26 AM  Luis Ferguson  has presented today for surgery, with the diagnosis of Severe Aortic Stenosis  The various methods of treatment have been discussed with the patient and family. After consideration of risks, benefits and other options for treatment, the patient has consented to  Procedure(s): MINIMALLY INVASIVE AORTIC VALVE REPLACEMENT,THORACIC APPROACH, RIGHT MINI THORACOTOMY, TEE (Right) TRANSESOPHAGEAL ECHOCARDIOGRAM (TEE) (N/A) as a surgical intervention .  [name] history has been reviewed, patient examined, no change in status, stable for surgery.  I have reviewed the patient's chart and labs.  Questions were answered to the patient's satisfaction.     [name]

## 2017-07-12 NOTE — Anesthesia Postprocedure Evaluation (Signed)
Anesthesia Post Note  Patient: Luis Ferguson  Procedure(s) Performed: MINIMALLY INVASIVE AORTIC VALVE REPLACEMENT,THORACIC APPROACH, RIGHT MINI THORACOTOMY, TEE (Right Chest) TRANSESOPHAGEAL ECHOCARDIOGRAM (TEE) (N/A )     Patient location during evaluation: SICU Anesthesia Type: General Level of consciousness: sedated Pain management: pain level controlled Vital Signs Assessment: post-procedure vital signs reviewed and stable Respiratory status: patient remains intubated per anesthesia plan Cardiovascular status: stable Postop Assessment: no apparent nausea or vomiting Anesthetic complications: no    Last Vitals:  Vitals:   07/12/17 1426 07/12/17 1500  BP: 140/82 127/80  Pulse: 62 61  Resp: 12 12  Temp: (!) 35.8 C (!) 35.7 C  SpO2: 100% 99%    Last Pain:  Vitals:   07/12/17 1500  TempSrc: Core (Comment)  PainSc:                  Kennieth Rad

## 2017-07-12 NOTE — Op Note (Signed)
NAMEMarland Ferguson  Luis, Ferguson               ACCOUNT NO.:  [id number]  MEDICAL RECORD NO.:  [id number]  LOCATION:                                 FACILITY:  PHYSICIAN:  Salvatore Decent. Dorris Fetch, M.D. DATE OF BIRTH:  DATE OF PROCEDURE:  07/12/2017 DATE OF DISCHARGE:                              OPERATIVE REPORT   PREOPERATIVE DIAGNOSIS:  Severe aortic stenosis.  POSTOPERATIVE DIAGNOSIS:  Severe aortic stenosis.  PROCEDURES:  Right mini thoracotomy, aortic valve replacement with Edwards Intuity Elite aortic valve, size 21 mm, model #8300AB, serial #[id number].  SURGEON:  Charlett Lango, MD.  ASSISTANT:  Gershon Crane, PA-C.  ANESTHESIA:  General.  FINDINGS:  Tricuspid, calcific, nonrheumatic stenotic aortic valve with moderate annular calcification, preserved left ventricular function, small to moderate perivalvular leak noted after chest closure.  CLINICAL NOTE:  [name] is a 56 year old man with known aortic stenosis.  He has recently become symptomatic with both dizziness and shortness of breath with exertion.  Echocardiogram showed severe aortic stenosis.  On cardiac catheterization, he had normal coronaries.  He was referred for aortic valve replacement.  The indications, risks, benefits, and alternatives were discussed in detail with the patient.  A prolonged discussion was had regarding valve type.  He was adamant about his preference for a tissue valve over a mechanical valve due to the issue of lifelong anticoagulation.  He understood the possibility of needing a redo procedure in the future.  Approaches including sternotomy, partial sternotomy and right mini thoracotomy were discussed in detail with the patient.  He strongly preferred a right mini thoracotomy.  The indications, risks, benefits, and alternatives were discussed in detail with Mr. Drace.  He understood and accepted the risks and agreed to proceed.  OPERATIVE NOTE:  [name] was brought to the preoperative  holding area on July 12, 2017.  Anesthesia placed a Swan-Ganz catheter and arterial blood pressure monitoring line.  He was taken to the operating room, anesthetized, and intubated with a double-lumen endotracheal tube. Transesophageal echocardiography was performed.  It revealed a calcific stenotic tricuspid valve with no impairment of left ventricular function.  There was no other significant valvular pathology. Intravenous antibiotics were administered.  A Foley catheter was placed. The chest, abdomen, and legs were prepped and draped in usual sterile fashion.  A transverse incision was made over the 3rd costal cartilage. This incision was approximately 10 cm in length.  The pectoralis muscle fibers were separated.  The periosteum over the costal cartilage was incised and then elevated and the costal cartilage was removed and placed into heparinized saline and blood solution until later in the procedure.  The right internal mammary vessels were ligated and divided.  The pericardium was opened.  An incision was made in the right groin.  The common femoral artery and vein were dissected out.  The patient was heparinized.  Purse-string sutures were placed in the common femoral artery and vein.  The artery was then cannulated using the Seldinger technique with an 18-French arterial cannula.  This was connected to the bypass circuit.  A 22-French dual-stage venous cannula then was advanced using the Seldinger technique.  It was advanced until the tip of the  cannula was within the superior vena cava.  Cardiopulmonary bypass was initiated.  With the retractor in place, pericardial stay sutures were placed.  A purse-string suture was placed in the right superior pulmonary vein and the left ventricular vent was placed through the superior pulmonary vein.  A retrograde cardioplegia cannula was placed via purse-string suture in the right atrium and directed into the coronary sinus.  This was  very difficult to get this cannula position, but once it was positioned it functioned well.  An antegrade cardioplegia cannula was placed in the ascending aorta.  The aorta was crossclamped.  Cardiac arrest then was achieved with cold antegrade and retrograde blood cardioplegia.  1.1 L of cardioplegia was administered antegrade.  There was a rapid diastolic arrest.  An additional 400 mL of cardioplegia was administered retrograde.  An aortotomy was made and extended into the noncoronary sinus of Valsalva. The aortic valve was tricuspid valve.  There was moderate calcification of the leaflets in the center of the leaflets near the coaptation zone, the free edge of the leaflets as well as the base of 2 of the leaflets were free of calcification.  The leaflets were excised. There was moderate annular calcification.  This was debrided.  Care was taken to contain all calcific debris.  The anulus was copiously irrigated with cold saline.  The anulus sized for a 21-mm Edwards Intuity bovine pericardial valve that was prepared per [employer] Corporationmanufacturer's recommendations.  2-0 Ethibond simple sutures were placed at the nadir of each anulus.  Sutures then were placed through the sewing ring of the valve.  The valve was lowered into place.  The sutures were tied using a Cor-Knot device.  The valve then was deployed by inflating the balloon to 4.5 atmospheres for 12 seconds.  The balloon was removed.  Inspection through the leaflet showed only LV outflow tract visible beneath the stent.  Inspection of the anulus showed no apparent gaps or sites for perivalvular leaks.  The coronary ostia were inspected and there was no impingement.  Rewarming was begun.  The aortotomy then was closed in 2 layers with a running 4-0 Prolene suture.  The first layer was a running horizontal mattress suture followed by a running simple suture.  At the completion of the first layer of the closure, the patient was placed  in Trendelenburg position.  A reanimation dose of cardioplegia was administered via the retrograde cannula that was 400 mL.  Extensive Tommi RumpsDe- airing was performed.  The suture was tied and then the second layer of the closure was performed.  The aortic crossclamp was removed.  The total crossclamp time was 83 minutes.  The patient spontaneously resumed sinus rhythm and he did not require defibrillation.  Epicardial pacing wires were placed on the right ventricle and right atrium.  The retrograde cardioplegia cannula and LV vent were removed. Additional de-airing maneuvers were performed when removing the left ventricular vent.  The antegrade cardioplegia cannula was left in place and hooked to a vent to evacuate any residual air.  When the patient rewarmed to a core temperature of 37 degrees Celsius, he was weaned from cardiopulmonary bypass on the first attempt without inotropic support. He was in sinus rhythm and did not require pacing.  Total bypass time was 169 minutes.  The initial cardiac index was greater than 2 L/min/m2.  The patient remained hemodynamically stable throughout the post-bypass period.  Post-bypass transesophageal echocardiography revealed preserved left ventricular wall motion.  There was good function of the prosthetic  valve with no perivalvular leak.  A test dose of protamine was administered and it was well tolerated.  The venous cannula and then the arterial cannula were removed.  The sutures were tied.  There was good hemostasis at both sites.  The remainder of the protamine was administered without incident.  The chest was irrigated with warm saline.  Hemostasis was achieved.  Two Blake drains were placed through separate subcostal incisions.  One was advanced into the pericardium. The other was placed into the right pleural space.  They were secured to skin with #1 silk sutures.  The groin incision was closed in 2 layers with a running #1 suture in the  subcutaneous tissue followed by a 3-0 Vicryl suture.  The third costal cartilage was replaced and secured to the rib in the sternum with a plate using 3 screws on the sternum and the rib and 4 in the plate in the costal cartilage itself.  Around this point, further inspection with transesophageal echocardiography revealed a small to moderate paravalvular leak.  This had not been apparent earlier prior to reversing heparin and removing the cannula.  The patient did not have any hemodynamic issues related to this and given his desire to avoid a sternotomy, the option was taken to follow this clinically rather than perform a sternotomy to go in and re-replace the valve.  The chest wound then was closed with a #1 Vicryl fascial suture, 2-0 Vicryl subcutaneous suture, and 3-0 Vicryl subcuticular suture.  All sponge, needle, and instrument counts were correct at the end of the procedure.  The patient remained hemodynamically stable.  He was taken from the operating room to the Surgical Intensive Care Unit intubated and in good condition.     Salvatore Decent Dorris Fetch, M.D.     SCH/MEDQ  D:  07/12/2017  T:  07/12/2017  Job:  [id number]

## 2017-07-12 NOTE — Procedures (Signed)
Extubation Procedure Note  Patient Details:   Name: KEYVION ESCHETE DOB: 25-Dec-1960 MRN: [id number]   Airway Documentation:    Patient extubated per protocol. Patient had positive cuff leak. Patient VC and NIF within normal limits. Patient placed on 4lpm nasal cannula humidified. Patient has strong cough. Able to voice his name and location. Incentive Spirometry instructed with teach back. Achieved x10. Will continue to monitor patient's condition.  Evaluation  O2 sats: stable throughout Complications: No apparent complications Patient did tolerate procedure well. Bilateral Breath Sounds: Clear, Diminished   Yes  Suszanne Conners 07/12/2017, 6:13 PM

## 2017-07-12 NOTE — Anesthesia Procedure Notes (Signed)
Procedure Name: Intubation Date/Time: 07/12/2017 7:57 AM Performed by: Coralee Rud, CRNA Pre-anesthesia Checklist: Patient identified, Emergency Drugs available, Suction available and Patient being monitored Patient Re-evaluated:Patient Re-evaluated prior to induction Oxygen Delivery Method: Circle system utilized Preoxygenation: Pre-oxygenation with 100% oxygen Induction Type: IV induction Ventilation: Mask ventilation without difficulty Laryngoscope Size: Miller and 3 Grade View: Grade I Endobronchial tube: Double lumen EBT and Left and 39 Fr Number of attempts: 1 Airway Equipment and Method: Stylet Placement Confirmation: ETT inserted through vocal cords under direct vision,  positive ETCO2 and breath sounds checked- equal and bilateral (Fiber Optic confirmation) Secured at: 31 cm Tube secured with: Tape Dental Injury: Teeth and Oropharynx as per pre-operative assessment

## 2017-07-13 ENCOUNTER — Encounter (HOSPITAL_COMMUNITY): Payer: Self-pay | Admitting: Thoracic Surgery (Cardiothoracic Vascular Surgery)

## 2017-07-13 ENCOUNTER — Inpatient Hospital Stay (HOSPITAL_COMMUNITY): Payer: Commercial Managed Care - PPO

## 2017-07-13 LAB — GLUCOSE, CAPILLARY
GLUCOSE-CAPILLARY: 102 mg/dL — AB (ref 65–99)
GLUCOSE-CAPILLARY: 111 mg/dL — AB (ref 65–99)
GLUCOSE-CAPILLARY: 117 mg/dL — AB (ref 65–99)
GLUCOSE-CAPILLARY: 123 mg/dL — AB (ref 65–99)
GLUCOSE-CAPILLARY: 88 mg/dL (ref 65–99)
Glucose-Capillary: 114 mg/dL — ABNORMAL HIGH (ref 65–99)

## 2017-07-13 LAB — CBC
HEMATOCRIT: 29.9 % — AB (ref 39.0–52.0)
HEMATOCRIT: 29.8 % — AB (ref 39.0–52.0)
HEMOGLOBIN: 9.7 g/dL — AB (ref 13.0–17.0)
HEMOGLOBIN: 9.8 g/dL — AB (ref 13.0–17.0)
MCH: 27.8 pg (ref 26.0–34.0)
MCH: 28.8 pg (ref 26.0–34.0)
MCHC: 32.4 g/dL (ref 30.0–36.0)
MCHC: 32.9 g/dL (ref 30.0–36.0)
MCV: 85.7 fL (ref 78.0–100.0)
MCV: 87.6 fL (ref 78.0–100.0)
Platelets: 59 10*3/uL — ABNORMAL LOW (ref 150–400)
Platelets: 70 10*3/uL — ABNORMAL LOW (ref 150–400)
RBC: 3.49 MIL/uL — ABNORMAL LOW (ref 4.22–5.81)
RBC: 3.4 MIL/uL — AB (ref 4.22–5.81)
RDW: 15.1 % (ref 11.5–15.5)
RDW: 15.7 % — ABNORMAL HIGH (ref 11.5–15.5)
WBC: 6.6 10*3/uL (ref 4.0–10.5)
WBC: 13.8 10*3/uL — ABNORMAL HIGH (ref 4.0–10.5)

## 2017-07-13 LAB — ECHO TEE
AV Area VTI index: 0.68 cm2/m2
AV Area mean vel: 1.07 cm2
AV Peak grad: 38 mmHg
AV VEL mean LVOT/AV: 0.31
AV area mean vel ind: 0.59 cm2/m2
AV pk vel: 307 cm/s
AV vel: 1.23
AVA: 1.23 cm2
AVAREAVTI: 1.03 cm2
AVG: 23 mmHg
Ao pk vel: 0.3 m/s
CHL CUP AV PEAK INDEX: 0.57
CHL CUP DOP CALC LVOT VTI: 23 cm
DOP CAL AO MEAN VELOCITY: 217 cm/s
LVOT area: 3.46 cm2
LVOTD: 21 mm
LVOTPV: 91.7 cm/s
LVOTSV: 80 mL
LVOTVTI: 0.35 cm
P 1/2 time: 675 ms
VTI: 64.9 cm
Valve area index: 0.68

## 2017-07-13 LAB — BASIC METABOLIC PANEL
Anion gap: 6 (ref 5–15)
BUN: 17 mg/dL (ref 6–20)
CHLORIDE: 108 mmol/L (ref 101–111)
CO2: 21 mmol/L — AB (ref 22–32)
CREATININE: 1.01 mg/dL (ref 0.61–1.24)
Calcium: 7.3 mg/dL — ABNORMAL LOW (ref 8.9–10.3)
GFR calc non Af Amer: >60 mL/min (ref >60)
Glucose, Bld: 132 mg/dL — ABNORMAL HIGH (ref 65–99)
Potassium: 5.1 mmol/L (ref 3.5–5.1)
Sodium: 135 mmol/L (ref 135–145)

## 2017-07-13 LAB — POCT I-STAT, CHEM 8
BUN: 25 mg/dL — ABNORMAL HIGH (ref 6–20)
CALCIUM ION: 1.03 mmol/L — AB (ref 1.15–1.40)
CHLORIDE: 97 mmol/L — AB (ref 101–111)
Creatinine, Ser: 1.1 mg/dL (ref 0.61–1.24)
GLUCOSE: 135 mg/dL — AB (ref 65–99)
HCT: 31 % — ABNORMAL LOW (ref 39.0–52.0)
HEMOGLOBIN: 10.5 g/dL — AB (ref 13.0–17.0)
Potassium: 4.5 mmol/L (ref 3.5–5.1)
SODIUM: 134 mmol/L — AB (ref 135–145)
TCO2: 24 mmol/L (ref 22–32)

## 2017-07-13 LAB — MAGNESIUM
MAGNESIUM: 1.4 mg/dL — AB (ref 1.7–2.4)
MAGNESIUM: 2.7 mg/dL — AB (ref 1.7–2.4)
Magnesium: 2.9 mg/dL — ABNORMAL HIGH (ref 1.7–2.4)

## 2017-07-13 LAB — CREATININE, SERUM
CREATININE: 1.21 mg/dL (ref 0.61–1.24)
Creatinine, Ser: 0.58 mg/dL — ABNORMAL LOW (ref 0.61–1.24)
GFR calc Af Amer: >60 mL/min (ref >60)

## 2017-07-13 MED ORDER — CALCIUM CARBONATE ANTACID 500 MG PO CHEW: 1.0000 | CHEWABLE_TABLET | Freq: Every day | ORAL | Status: DC | PRN

## 2017-07-13 MED ORDER — ATORVASTATIN CALCIUM 20 MG PO TABS
20.0000 mg | ORAL_TABLET | Freq: Every day | ORAL | Status: DC
  Administered 2017-07-13 – 2017-07-18 (×6): 20 mg via ORAL
  Filled 2017-07-13 (×6): qty 1

## 2017-07-13 MED ORDER — METOPROLOL TARTRATE 25 MG PO TABS
25.0000 mg | ORAL_TABLET | Freq: Two times a day (BID) | ORAL | Status: DC
  Administered 2017-07-13: 25 mg via ORAL
  Filled 2017-07-13: qty 2
  Filled 2017-07-13: qty 1

## 2017-07-13 MED ORDER — HYDROXYCHLOROQUINE SULFATE 200 MG PO TABS
400.0000 mg | ORAL_TABLET | Freq: Every day | ORAL | Status: DC
  Administered 2017-07-13 – 2017-07-18 (×6): 400 mg via ORAL
  Filled 2017-07-13 (×6): qty 2

## 2017-07-13 MED ORDER — INSULIN ASPART 100 UNIT/ML ~~LOC~~ SOLN: 0.0000 [IU] | SUBCUTANEOUS | Status: DC

## 2017-07-13 MED ORDER — METOPROLOL TARTRATE 25 MG/10 ML ORAL SUSPENSION: 25.0000 mg | Freq: Two times a day (BID) | ORAL | Status: DC

## 2017-07-13 MED ORDER — ALBUMIN HUMAN 5 % IV SOLN
12.5000 g | Freq: Once | INTRAVENOUS | Status: AC
  Administered 2017-07-13: 12.5 g via INTRAVENOUS
  Filled 2017-07-13: qty 250

## 2017-07-13 MED ORDER — FUROSEMIDE 10 MG/ML IJ SOLN
20.0000 mg | Freq: Two times a day (BID) | INTRAMUSCULAR | Status: DC
  Administered 2017-07-13 (×2): 20 mg via INTRAVENOUS
  Filled 2017-07-13 (×2): qty 2

## 2017-07-13 MED ORDER — TAMSULOSIN HCL 0.4 MG PO CAPS
0.4000 mg | ORAL_CAPSULE | Freq: Every day | ORAL | Status: DC
  Administered 2017-07-13 – 2017-07-18 (×6): 0.4 mg via ORAL
  Filled 2017-07-13 (×6): qty 1

## 2017-07-13 MED ORDER — ORAL CARE MOUTH RINSE
15.0000 mL | Freq: Two times a day (BID) | OROMUCOSAL | Status: DC
  Administered 2017-07-13 – 2017-07-18 (×7): 15 mL via OROMUCOSAL

## 2017-07-13 MED ORDER — FUROSEMIDE 10 MG/ML IJ SOLN
20.0000 mg | Freq: Once | INTRAMUSCULAR | Status: AC
  Administered 2017-07-13: 20 mg via INTRAVENOUS
  Filled 2017-07-13: qty 2

## 2017-07-13 NOTE — Plan of Care (Signed)
  Progressing Pain Managment: General experience of comfort will improve 07/13/2017 2111 - Progressing by Pollyann Kennedy, RN Activity: Risk for activity intolerance will decrease 07/13/2017 2111 - Progressing by Pollyann Kennedy, RN

## 2017-07-13 NOTE — Progress Notes (Signed)
1 Day Post-Op Procedure(s) (LRB): MINIMALLY INVASIVE AORTIC VALVE REPLACEMENT,THORACIC APPROACH, RIGHT MINI THORACOTOMY, TEE (Right) TRANSESOPHAGEAL ECHOCARDIOGRAM (TEE) (N/A) Subjective: Some incisional pain  Objective: Vital signs in last 24 hours: Temp:  [96.3 F (35.7 C)-99 F (37.2 C)] 99 F (37.2 C) (12/18 0700) Pulse Rate:  [59-80] 80 (12/18 0700) Cardiac Rhythm: Normal sinus rhythm (12/18 0000) Resp:  [9-24] 24 (12/18 0700) BP: (104-169)/(59-93) 169/89 (12/18 0700) SpO2:  [97 %-100 %] 100 % (12/18 0700) Arterial Line BP: (80-188)/(53-81) 188/81 (12/18 0700) FiO2 (%):  [40 %-50 %] 40 % (12/17 1732) Weight:  [170 lb 3.1 oz (77.2 kg)] 170 lb 3.1 oz (77.2 kg) (12/18 0440)  Hemodynamic parameters for last 24 hours: PAP: (13-40)/(4-18) 40/17 CO:  [2.7 L/min-4.7 L/min] 4.3 L/min CI:  [1.5 L/min/m2-2.6 L/min/m2] 2.3 L/min/m2  Intake/Output from previous day: 12/17 0701 - 12/18 0700 In: 7573.3 [I.V.:4768.3; Blood:1055; IV Piggyback:1750] Out: 4490 [Urine:1670; Emesis/NG output:50; Blood:2400; Chest Tube:370] Intake/Output this shift: No intake/output data recorded.  General appearance: alert, cooperative and mild distress Neurologic: intact Heart: regular rate and rhythm Lungs: diminished breath sounds bibasilar Abdomen: normal findings: soft, non-tender  Lab Results: Recent Labs    07/12/17 1954 07/12/17 2002 07/13/17 0415  WBC 4.8  --  6.6  HGB 9.5* 9.2* 9.7*  HCT 28.8* 27.0* 29.9*  PLT 58*  --  59*   BMET:  Recent Labs    07/12/17 2002 07/13/17 0415  NA 139 135  K 4.6 5.1  CL 104 108  CO2  --  21*  GLUCOSE 114* 132*  BUN 12 17  CREATININE 0.60* 1.01  CALCIUM  --  7.3*    PT/INR:  Recent Labs    07/12/17 1419  LABPROT 19.8*  INR 1.70   ABG    Component Value Date/Time   PHART 7.358 07/12/2017 1909   HCO3 20.9 07/12/2017 1909   TCO2 28 07/12/2017 2002   ACIDBASEDEF 4.0 (H) 07/12/2017 1909   O2SAT 99.0 07/12/2017 1909   CBG (last 3)   Recent Labs    07/12/17 1901 07/13/17 0052 07/13/17 0729  GLUCAP 112* 123* 111*    Assessment/Plan: S/P Procedure(s) (LRB): MINIMALLY INVASIVE AORTIC VALVE REPLACEMENT,THORACIC APPROACH, RIGHT MINI THORACOTOMY, TEE (Right) TRANSESOPHAGEAL ECHOCARDIOGRAM (TEE) (N/A) -POD # 1 CV- good hemodynamics  Hypertension- beta blocker, wean NTg as BP allows  Dc swan  ASA for pericardial valve  RESP- IS  RENAL- creatinine OK but UOP relatively low despite good hemodynamics  Diurese  ENDO- CBG well controlled  HEME- anemia secondary to ABL- follow  Thrombocytopenia- SCD for DVT prophylaxis, avoid heparin   No active bleeding, follow  Advance diet    LOS: 1 day    [name] 07/13/2017

## 2017-07-13 NOTE — Progress Notes (Signed)
Please note, the creatinine and magnesium labs for 1700 are not accurate due to bad sample. Please follow the redraw at 1720 for correct results.

## 2017-07-13 NOTE — Progress Notes (Signed)
Dr. Dorris Fetch made aware of pt's inadequate UOP 10cc/hr and low PA pressures. Verbal order received to administer two additional albumins. Will implement and continue to monitor pt closely.

## 2017-07-13 NOTE — Progress Notes (Signed)
CT surgery p.m. Rounds  Patient resting in bed after minimally invasive aVR yesterday Maintained sinus rhythm Ambulating in hallway Incisional pain controlled with oral medication P.m. labs are satisfactory, platelet count slightly improved to 70,000 from 58,000. No significant chest tube output

## 2017-07-14 ENCOUNTER — Inpatient Hospital Stay (HOSPITAL_COMMUNITY): Payer: Commercial Managed Care - PPO

## 2017-07-14 LAB — CBC
HEMATOCRIT: 26.7 % — AB (ref 39.0–52.0)
HEMOGLOBIN: 8.7 g/dL — AB (ref 13.0–17.0)
MCH: 28.1 pg (ref 26.0–34.0)
MCHC: 32.6 g/dL (ref 30.0–36.0)
MCV: 86.1 fL (ref 78.0–100.0)
Platelets: 42 10*3/uL — ABNORMAL LOW (ref 150–400)
RBC: 3.1 MIL/uL — AB (ref 4.22–5.81)
RDW: 15.5 % (ref 11.5–15.5)
WBC: 10.5 10*3/uL (ref 4.0–10.5)

## 2017-07-14 LAB — GLUCOSE, CAPILLARY
GLUCOSE-CAPILLARY: 103 mg/dL — AB (ref 65–99)
GLUCOSE-CAPILLARY: 130 mg/dL — AB (ref 65–99)
GLUCOSE-CAPILLARY: 76 mg/dL (ref 65–99)
Glucose-Capillary: 93 mg/dL (ref 65–99)
Glucose-Capillary: 89 mg/dL (ref 65–99)
Glucose-Capillary: 111 mg/dL — ABNORMAL HIGH (ref 65–99)

## 2017-07-14 LAB — BASIC METABOLIC PANEL
Anion gap: 9 (ref 5–15)
BUN: 26 mg/dL — AB (ref 6–20)
CALCIUM: 7 mg/dL — AB (ref 8.9–10.3)
CHLORIDE: 99 mmol/L — AB (ref 101–111)
CO2: 22 mmol/L (ref 22–32)
CREATININE: 1.2 mg/dL (ref 0.61–1.24)
GFR calc Af Amer: >60 mL/min (ref >60)
GLUCOSE: 155 mg/dL — AB (ref 65–99)
Potassium: 4.1 mmol/L (ref 3.5–5.1)
SODIUM: 130 mmol/L — AB (ref 135–145)

## 2017-07-14 MED ORDER — INSULIN ASPART 100 UNIT/ML ~~LOC~~ SOLN
0.0000 [IU] | Freq: Three times a day (TID) | SUBCUTANEOUS | Status: DC
  Administered 2017-07-14: 2 [IU] via SUBCUTANEOUS

## 2017-07-14 MED ORDER — METOPROLOL TARTRATE 12.5 MG HALF TABLET
12.5000 mg | ORAL_TABLET | Freq: Two times a day (BID) | ORAL | Status: DC
  Administered 2017-07-14 – 2017-07-16 (×6): 12.5 mg via ORAL
  Filled 2017-07-14 (×6): qty 1

## 2017-07-14 MED ORDER — FUROSEMIDE 40 MG PO TABS
40.0000 mg | ORAL_TABLET | Freq: Every day | ORAL | Status: DC
  Administered 2017-07-14 – 2017-07-18 (×5): 40 mg via ORAL
  Filled 2017-07-14 (×5): qty 1

## 2017-07-14 MED FILL — Sodium Chloride IV Soln 0.9%: INTRAVENOUS | Qty: 3000 | Status: AC

## 2017-07-14 MED FILL — Electrolyte-R (PH 7.4) Solution: INTRAVENOUS | Qty: 4000 | Status: AC

## 2017-07-14 MED FILL — Mannitol IV Soln 20%: INTRAVENOUS | Qty: 1000 | Status: AC

## 2017-07-14 MED FILL — Heparin Sodium (Porcine) Inj 1000 Unit/ML: INTRAMUSCULAR | Qty: 10 | Status: AC

## 2017-07-14 MED FILL — Lidocaine HCl IV Inj 20 MG/ML: INTRAVENOUS | Qty: 25 | Status: AC

## 2017-07-14 MED FILL — Sodium Bicarbonate IV Soln 8.4%: INTRAVENOUS | Qty: 50 | Status: AC

## 2017-07-14 NOTE — Progress Notes (Signed)
   07/14/17 1132  Clinical Encounter Type  Visited With Patient  Visit Type Initial;Spiritual support;Post-op  Stopped in to greet patient and provide support post op.  Will continue to look in on.

## 2017-07-14 NOTE — Progress Notes (Signed)
Patient ID: Luis Ferguson, male   DOB: [birth year], 56 y.o.   MRN: [id number] EVENING ROUNDS NOTE :     [address]       [place] [address]             [phone]                 2 Days Post-Op Procedure(s) (LRB): MINIMALLY INVASIVE AORTIC VALVE REPLACEMENT,THORACIC APPROACH, RIGHT MINI THORACOTOMY, TEE (Right) TRANSESOPHAGEAL ECHOCARDIOGRAM (TEE) (N/A)  Total Length of Stay:  LOS: 2 days  BP 107/74   Pulse 78   Temp 98.4 F (36.9 C) (Oral)   Resp (!) 21   Ht 5\' 6"  (1.676 m)   Wt 172 lb (78 kg)   SpO2 96%   BMI 27.76 kg/m   .Intake/Output      12/19 0701 - 12/20 0700   P.O. 600   I.V. (mL/kg) 20 (0.3)   IV Piggyback 50   Total Intake(mL/kg) 670 (8.6)   Urine (mL/kg/hr) 510 (0.5)   Emesis/NG output 0   Chest Tube 130   Total Output 640   Net +30       Emesis Occurrence 1 x     . sodium chloride 20 mL/hr at 07/13/17 0700  . sodium chloride    . sodium chloride 20 mL/hr at 07/12/17 2000  . dexmedetomidine (PRECEDEX) IV infusion Stopped (07/12/17 1611)  . lactated ringers    . lactated ringers Stopped (07/12/17 1448)  . lactated ringers 20 mL/hr at 07/14/17 0800  . nitroGLYCERIN Stopped (07/13/17 2136)  . phenylephrine (NEO-SYNEPHRINE) Adult infusion Stopped (07/12/17 1425)     Lab Results  Component Value Date   WBC 10.5 07/14/2017   HGB 8.7 (L) 07/14/2017   HCT 26.7 (L) 07/14/2017   PLT 42 (L) 07/14/2017   GLUCOSE 155 (H) 07/14/2017   ALT 50 07/09/2017   AST 36 07/09/2017   NA 130 (L) 07/14/2017   K 4.1 07/14/2017   CL 99 (L) 07/14/2017   CREATININE 1.20 07/14/2017   BUN 26 (H) 07/14/2017   CO2 22 07/14/2017   TSH 3.530 06/15/2017   INR 1.70 07/12/2017   HGBA1C 5.6 07/09/2017   Low plts , not heparin, HIT sent    Delight Ovens MD  Beeper [phone] Office [phone] 07/14/2017 8:01 PM

## 2017-07-14 NOTE — Progress Notes (Signed)
2 Days Post-Op Procedure(s) (LRB): MINIMALLY INVASIVE AORTIC VALVE REPLACEMENT,THORACIC APPROACH, RIGHT MINI THORACOTOMY, TEE (Right) TRANSESOPHAGEAL ECHOCARDIOGRAM (TEE) (N/A) Subjective: C/o incisional pain  Objective: Vital signs in last 24 hours: Temp:  [98 F (36.7 C)-99.9 F (37.7 C)] 98.8 F (37.1 C) (12/19 0400) Pulse Rate:  [68-114] 82 (12/19 0800) Cardiac Rhythm: Normal sinus rhythm (12/19 0700) Resp:  [15-26] 24 (12/19 0800) BP: (92-186)/(56-92) 109/65 (12/19 0800) SpO2:  [90 %-100 %] 98 % (12/19 0800) Weight:  [172 lb (78 kg)] 172 lb (78 kg) (12/19 0600)  Hemodynamic parameters for last 24 hours:    Intake/Output from previous day: 12/18 0701 - 12/19 0700 In: 1444.6 [P.O.:660; I.V.:684.6; IV Piggyback:100] Out: 2050 [Urine:1540; Chest Tube:510] Intake/Output this shift: Total I/O In: 20 [I.V.:20] Out: 60 [Urine:60]  General appearance: alert, cooperative and mild distress Neurologic: intact Heart: regular rate and rhythm and + systolic murmur Lungs: diminished breath sounds bibasilar Wound: clean and dry  Lab Results: Recent Labs    07/13/17 1700 07/13/17 1723 07/14/17 0517  WBC 13.8*  --  10.5  HGB 9.8* 10.5* 8.7*  HCT 29.8* 31.0* 26.7*  PLT 70*  --  42*   BMET:  Recent Labs    07/13/17 0415  07/13/17 1723 07/14/17 0517  NA 135  --  134* 130*  K 5.1  --  4.5 4.1  CL 108  --  97* 99*  CO2 21*  --   --  22  GLUCOSE 132*  --  135* 155*  BUN 17  --  25* 26*  CREATININE 1.01   < > 1.10 1.20  CALCIUM 7.3*  --   --  7.0*   < > = values in this interval not displayed.    PT/INR:  Recent Labs    07/12/17 1419  LABPROT 19.8*  INR 1.70   ABG    Component Value Date/Time   PHART 7.358 07/12/2017 1909   HCO3 20.9 07/12/2017 1909   TCO2 24 07/13/2017 1723   ACIDBASEDEF 4.0 (H) 07/12/2017 1909   O2SAT 99.0 07/12/2017 1909   CBG (last 3)  Recent Labs    07/13/17 2039 07/13/17 2359 07/14/17 0402  GLUCAP 117* 103* 93     Assessment/Plan: S/P Procedure(s) (LRB): MINIMALLY INVASIVE AORTIC VALVE REPLACEMENT,THORACIC APPROACH, RIGHT MINI THORACOTOMY, TEE (Right) TRANSESOPHAGEAL ECHOCARDIOGRAM (TEE) (N/A) -CV- in SR, BP initially high yesterday AM, lower overnight  Decrease lopressor  ASA for pericardial aortic valve  RESP- IS for basilar atelectasis  RENAL- creatinine stable, still volume overloaded  PO lasix  ENDO- change to AC HS CBG and SSI  Thrombocytopenia- PLT down slightly- not on heparin - check HIT Ab  Continue cardiac rehab   LOS: 2 days    [name] 07/14/2017

## 2017-07-15 LAB — GLUCOSE, CAPILLARY
GLUCOSE-CAPILLARY: 85 mg/dL (ref 65–99)
GLUCOSE-CAPILLARY: 81 mg/dL (ref 65–99)
Glucose-Capillary: 90 mg/dL (ref 65–99)
Glucose-Capillary: 112 mg/dL — ABNORMAL HIGH (ref 65–99)

## 2017-07-15 LAB — BASIC METABOLIC PANEL
ANION GAP: 4 — AB (ref 5–15)
BUN: 22 mg/dL — ABNORMAL HIGH (ref 6–20)
CALCIUM: 7.1 mg/dL — AB (ref 8.9–10.3)
CO2: 25 mmol/L (ref 22–32)
Chloride: 101 mmol/L (ref 101–111)
Creatinine, Ser: 0.87 mg/dL (ref 0.61–1.24)
Glucose, Bld: 96 mg/dL (ref 65–99)
POTASSIUM: 4.5 mmol/L (ref 3.5–5.1)
Sodium: 130 mmol/L — ABNORMAL LOW (ref 135–145)

## 2017-07-15 LAB — CBC
HEMATOCRIT: 26.3 % — AB (ref 39.0–52.0)
Hemoglobin: 8.8 g/dL — ABNORMAL LOW (ref 13.0–17.0)
MCH: 28.7 pg (ref 26.0–34.0)
MCHC: 33.5 g/dL (ref 30.0–36.0)
MCV: 85.7 fL (ref 78.0–100.0)
PLATELETS: 35 10*3/uL — AB (ref 150–400)
RBC: 3.07 MIL/uL — AB (ref 4.22–5.81)
RDW: 15.5 % (ref 11.5–15.5)
WBC: 8.5 10*3/uL (ref 4.0–10.5)

## 2017-07-15 MED ORDER — ASPIRIN EC 81 MG PO TBEC
81.0000 mg | DELAYED_RELEASE_TABLET | Freq: Every day | ORAL | Status: DC
  Administered 2017-07-15 – 2017-07-18 (×4): 81 mg via ORAL
  Filled 2017-07-15 (×4): qty 1

## 2017-07-15 NOTE — Progress Notes (Signed)
[facility] BRIEF SICU PROGRESS NOTE  3 Days Post-Op  S/P Procedure(s) (LRB): MINIMALLY INVASIVE AORTIC VALVE REPLACEMENT,THORACIC APPROACH, RIGHT MINI THORACOTOMY, TEE (Right) TRANSESOPHAGEAL ECHOCARDIOGRAM (TEE) (N/A)   Stable day  Plan: Awaiting bed for transfer  Purcell Nails, MD 07/15/2017 7:19 PM

## 2017-07-15 NOTE — Progress Notes (Signed)
3 Days Post-Op Procedure(s) (LRB): MINIMALLY INVASIVE AORTIC VALVE REPLACEMENT,THORACIC APPROACH, RIGHT MINI THORACOTOMY, TEE (Right) TRANSESOPHAGEAL ECHOCARDIOGRAM (TEE) (N/A) Subjective: A little sore  Objective: Vital signs in last 24 hours: Temp:  [97.7 F (36.5 C)-99.1 F (37.3 C)] 97.9 F (36.6 C) (12/20 0733) Pulse Rate:  [64-87] 73 (12/20 0700) Cardiac Rhythm: Normal sinus rhythm (12/19 2000) Resp:  [15-28] 23 (12/20 0700) BP: (89-120)/(46-79) 116/66 (12/20 0700) SpO2:  [93 %-100 %] 97 % (12/20 0700) Weight:  [171 lb 8 oz (77.8 kg)] 171 lb 8 oz (77.8 kg) (12/20 0500)  Hemodynamic parameters for last 24 hours:    Intake/Output from previous day: 12/19 0701 - 12/20 0700 In: 790 [P.O.:720; I.V.:20; IV Piggyback:50] Out: 1010 [Urine:760; Chest Tube:250] Intake/Output this shift: No intake/output data recorded.  General appearance: alert, cooperative and no distress Neurologic: intact Heart: regular rate and rhythm Lungs: diminished breath sounds bibasilar Wound: clean and dry  Lab Results: Recent Labs    07/14/17 0517 07/15/17 0521  WBC 10.5 8.5  HGB 8.7* 8.8*  HCT 26.7* 26.3*  PLT 42* 35*   BMET:  Recent Labs    07/14/17 0517 07/15/17 0521  NA 130* 130*  K 4.1 4.5  CL 99* 101  CO2 22 25  GLUCOSE 155* 96  BUN 26* 22*  CREATININE 1.20 0.87  CALCIUM 7.0* 7.1*    PT/INR:  Recent Labs    07/12/17 1419  LABPROT 19.8*  INR 1.70   ABG    Component Value Date/Time   PHART 7.358 07/12/2017 1909   HCO3 20.9 07/12/2017 1909   TCO2 24 07/13/2017 1723   ACIDBASEDEF 4.0 (H) 07/12/2017 1909   O2SAT 99.0 07/12/2017 1909   CBG (last 3)  Recent Labs    07/14/17 1656 07/14/17 2121 07/15/17 0730  GLUCAP 130* 111* 90    Assessment/Plan: S/P Procedure(s) (LRB): MINIMALLY INVASIVE AORTIC VALVE REPLACEMENT,THORACIC APPROACH, RIGHT MINI THORACOTOMY, TEE (Right) TRANSESOPHAGEAL ECHOCARDIOGRAM (TEE) (N/A) Plan for transfer to step-down: see transfer  orders  CV- stable in SR  RESP- continue IS for atelectasis  RENAL- mild hyponatremia, creatinine better  Continue PO lasix  ENDO- CBG well controlled  HEME- thrombocytopenia persists- HIT Ab persists  Not receiving heparin or lovenox but PLT down again this AM  Will ask hematology to see     LOS: 3 days    [name] 07/15/2017

## 2017-07-16 ENCOUNTER — Other Ambulatory Visit: Payer: Self-pay

## 2017-07-16 ENCOUNTER — Inpatient Hospital Stay (HOSPITAL_COMMUNITY): Payer: Commercial Managed Care - PPO

## 2017-07-16 LAB — BASIC METABOLIC PANEL
Anion gap: 4 — ABNORMAL LOW (ref 5–15)
BUN: 15 mg/dL (ref 6–20)
CHLORIDE: 100 mmol/L — AB (ref 101–111)
CO2: 26 mmol/L (ref 22–32)
CREATININE: 0.79 mg/dL (ref 0.61–1.24)
Calcium: 7.3 mg/dL — ABNORMAL LOW (ref 8.9–10.3)
GFR calc Af Amer: >60 mL/min (ref >60)
GFR calc non Af Amer: >60 mL/min (ref >60)
Glucose, Bld: 93 mg/dL (ref 65–99)
POTASSIUM: 4.4 mmol/L (ref 3.5–5.1)
SODIUM: 130 mmol/L — AB (ref 135–145)

## 2017-07-16 LAB — CBC
HEMATOCRIT: 26.4 % — AB (ref 39.0–52.0)
HEMOGLOBIN: 8.7 g/dL — AB (ref 13.0–17.0)
MCH: 28.7 pg (ref 26.0–34.0)
MCHC: 33 g/dL (ref 30.0–36.0)
MCV: 87.1 fL (ref 78.0–100.0)
Platelets: 58 10*3/uL — ABNORMAL LOW (ref 150–400)
RBC: 3.03 MIL/uL — AB (ref 4.22–5.81)
RDW: 15.9 % — ABNORMAL HIGH (ref 11.5–15.5)
WBC: 6.3 10*3/uL (ref 4.0–10.5)

## 2017-07-16 LAB — GLUCOSE, CAPILLARY
GLUCOSE-CAPILLARY: 65 mg/dL (ref 65–99)
GLUCOSE-CAPILLARY: 120 mg/dL — AB (ref 65–99)
Glucose-Capillary: 56 mg/dL — ABNORMAL LOW (ref 65–99)
Glucose-Capillary: 44 mg/dL — CL (ref 65–99)

## 2017-07-16 LAB — HEPARIN INDUCED PLATELET AB (HIT ANTIBODY): HEPARIN INDUCED PLT AB: 0.141 {OD_unit} (ref 0.000–0.400)

## 2017-07-16 MED ORDER — SCOPOLAMINE 1 MG/3DAYS TD PT72
1.0000 | MEDICATED_PATCH | TRANSDERMAL | Status: DC
  Administered 2017-07-16: 1.5 mg via TRANSDERMAL
  Filled 2017-07-16: qty 1

## 2017-07-16 MED ORDER — SODIUM CHLORIDE 0.9% FLUSH
3.0000 mL | Freq: Two times a day (BID) | INTRAVENOUS | Status: DC
  Administered 2017-07-16 (×2): 3 mL via INTRAVENOUS

## 2017-07-16 MED ORDER — LISINOPRIL 10 MG PO TABS
10.0000 mg | ORAL_TABLET | Freq: Every day | ORAL | Status: DC
  Administered 2017-07-16 – 2017-07-17 (×2): 10 mg via ORAL
  Filled 2017-07-16 (×2): qty 1

## 2017-07-16 MED ORDER — MOVING RIGHT ALONG BOOK
Freq: Once | Status: AC
  Administered 2017-07-16: 1
  Filled 2017-07-16: qty 1

## 2017-07-16 MED ORDER — SODIUM CHLORIDE 0.9 % IV SOLN
250.0000 mL | INTRAVENOUS | Status: DC | PRN
Start: 2017-07-16 — End: 2017-07-17

## 2017-07-16 MED ORDER — SODIUM CHLORIDE 0.9% FLUSH
3.0000 mL | INTRAVENOUS | Status: DC | PRN
Start: 2017-07-16 — End: 2017-07-17

## 2017-07-16 MED ORDER — PROMETHAZINE HCL 25 MG/ML IJ SOLN: 12.5000 mg | Freq: Four times a day (QID) | INTRAMUSCULAR | Status: DC | PRN

## 2017-07-16 MED ORDER — ALUM & MAG HYDROXIDE-SIMETH 200-200-20 MG/5ML PO SUSP: 15.0000 mL | Freq: Four times a day (QID) | ORAL | Status: DC | PRN

## 2017-07-16 MED ORDER — ZOLPIDEM TARTRATE 5 MG PO TABS
5.0000 mg | ORAL_TABLET | Freq: Every evening | ORAL | Status: DC | PRN
  Administered 2017-07-17: 5 mg via ORAL
  Filled 2017-07-16: qty 1

## 2017-07-16 NOTE — Discharge Instructions (Signed)
Aortic Valve Replacement, Care After °Refer to this sheet in the next few weeks. These instructions provide you with information about caring for yourself after your procedure. Your health care provider may also give you more specific instructions. Your treatment has been planned according to current medical practices, but problems sometimes occur. Call your health care provider if you have any problems or questions after your procedure. °What can I expect after the procedure? °After the procedure, it is common to have: °· Pain around your incision area. °· A small amount of blood or clear fluid coming from your incision. ° °Follow these instructions at home: °Eating and drinking ° °· Follow instructions from your health care provider about eating or drinking restrictions. °? Limit alcohol intake to no more than 1 drink per day for nonpregnant women and 2 drinks per day for men. One drink equals 12 oz of beer, 5 oz of wine, or 1½ oz of hard liquor. °? Limit how much caffeine you drink. Caffeine can affect your heart's rate and rhythm. °· Drink enough fluid to keep your urine clear or pale yellow. °· Eat a heart-healthy diet. This should include plenty of fresh fruits and vegetables. If you eat meat, it should be lean cuts. Avoid foods that are: °? High in salt, saturated fat, or sugar. °? Canned or highly processed. °? Fried. °Activity °· Return to your normal activities as told by your health care provider. Ask your health care provider what activities are safe for you. °· Exercise regularly once you have recovered, as told by your health care provider. °· Avoid sitting for more than 2 hours at a time without moving. Get up and move around at least once every 1-2 hours. This helps to prevent blood clots in the legs. °· Do not lift anything that is heavier than 10 lb (4.5 kg) until your health care provider approves. °· Avoid pushing or pulling things with your arms until your health care provider approves. This  includes pulling on handrails to help you climb stairs. °Incision care ° °· Follow instructions from your health care provider about how to take care of your incision. Make sure you: °? Wash your hands with soap and water before you change your bandage (dressing). If soap and water are not available, use hand sanitizer. °? Change your dressing as told by your health care provider. °? Leave stitches (sutures), skin glue, or adhesive strips in place. These skin closures may need to stay in place for 2 weeks or longer. If adhesive strip edges start to loosen and curl up, you may trim the loose edges. Do not remove adhesive strips completely unless your health care provider tells you to do that. °· Check your incision area every day for signs of infection. Check for: °? More redness, swelling, or pain. °? More fluid or blood. °? Warmth. °? Pus or a bad smell. °Medicines °· Take over-the-counter and prescription medicines only as told by your health care provider. °· If you were prescribed an antibiotic medicine, take it as told by your health care provider. Do not stop taking the antibiotic even if you start to feel better. °Travel °· Avoid airplane travel for as long as told by your health care provider. °· When you travel, bring a list of your medicines and a record of your medical history with you. Carry your medicines with you. °Driving °· Ask your health care provider when it is safe for you to drive. Do not drive until your health   care provider approves. °· Do not drive or operate heavy machinery while taking prescription pain medicine. °Lifestyle ° °· Do not use any tobacco products, such as cigarettes, chewing tobacco, or e-cigarettes. If you need help quitting, ask your health care provider. °· Resume sexual activity as told by your health care provider. Do not use medicines for erectile dysfunction unless your health care provider approves, if this applies. °· Work with your health care provider to keep your  blood pressure and cholesterol under control, and to manage any other heart conditions that you have. °· Maintain a healthy weight. °General instructions °· Do not take baths, swim, or use a hot tub until your health care provider approves. °· Do not strain to have a bowel movement. °· Avoid crossing your legs while sitting down. °· Check your temperature every day for a fever. A fever may be a sign of infection. °· If you are a woman and you plan to become pregnant, talk with your health care provider before you become pregnant. °· Wear compression stockings if your health care provider instructs you to do this. These stockings help to prevent blood clots and reduce swelling in your legs. °· Tell all health care providers who care for you that you have an artificial (prosthetic) aortic valve. If you have or have had heart disease or endocarditis, tell all health care providers about these conditions as well. °· Keep all follow-up visits as told by your health care provider. This is important. °Contact a health care provider if: °· You develop a skin rash. °· You experience sudden, unexplained changes in your weight. °· You have more redness, swelling, or pain around your incision. °· You have more fluid or blood coming from your incision. °· Your incision feels warm to the touch. °· You have pus or a bad smell coming from your incision. °· You have a fever. °Get help right away if: °· You develop chest pain that is different from the pain coming from your incision. °· You develop shortness of breath or difficulty breathing. °· You start to feel light-headed. °These symptoms may represent a serious problem that is an emergency. Do not wait to see if the symptoms will go away. Get medical help right away. Call your local emergency services (911 in the [place] Do not drive yourself to the hospital. °This information is not intended to replace advice given to you by your health care provider. Make sure you discuss any  questions you have with your health care provider. °Document Released: 01/29/2005 Document Revised: 12/19/2015 Document Reviewed: 06/16/2015 °Elsevier Interactive Patient Education © 2017 [employer] ° °

## 2017-07-16 NOTE — Discharge Summary (Signed)
Physician Discharge Summary  Patient ID: Luis Ferguson MRN: [id number] DOB/AGE: 57/03/1961 57 y.o.  Admit date: 07/12/2017 Discharge date: 07/18/2017  Admission Diagnoses: Severe aortic stenosis  Discharge Diagnoses:  Active Problems:   S/P AVR   Patient Active Problem List   Diagnosis Date Noted  . S/P AVR 07/12/2017  . Aortic valve regurgitation 01/15/2017  . Aortic stenosis 01/22/2015  . Possible bicuspid aortic valve 01/22/2015  . Dyslipidemia 01/22/2015  . Lupus 01/22/2015    HPI: Luis Ferguson is sent for consideration for aortic valve replacement.  Luis Ferguson is a 56 year old man with a past history of lupus, dyslipidemia, and aortic stenosis and insufficiency.  He was first noted to have a murmur on exam by Dr. Sherlyn Lickedmon a couple of years ago.  He was referred to Dr. Rennis GoldenHilty, who has been following him with echocardiograms since then.  He initially was noted to have moderate aortic stenosis and insufficiency with preserved left ventricular function.  In June of this year he was noted to have progression to severe aortic stenosis with a mean gradient of 47 mmHg.  He was asymptomatic at that time.  He recently saw Dr. Rennis GoldenHilty again.  His echo was not significantly changed, but he has developed symptoms.  His wife says that she has noticed it more so than he has.  But he has "aged" significantly in the last 6 months.  He has decreased energy and fatigues more easily.  He also has noted some swelling in his legs when he standing up for long periods of time.  And he gets shortness of breath when walking up an incline.  He also has had several dizzy spells including one earlier today while at work.  He has not had any chest pain, pressure, or tightness.  He denies paroxysmal nocturnal dyspnea or orthopnea.  He has not had frank syncope.  He underwent cardiac catheterization which revealed normal coronary arteries.  He had a dental cleaning about 4 months ago.   The patient was  admitted electively for the procedure.  Discharged Condition: good  Hospital Course:  The patient was admitted electively and taken to the operating room and underwent the below described procedure.  He tolerated it well and was taken to the surgical intensive care unit in stable condition.  Postoperative hospital course:  The patient has overall progressed nicely.  He has maintained stable hemodynamics.  He has maintained sinus rhythm.  He initially required nitroglycerin but this was able to be weaned.  He has had some hypertension and an ACE inhibitor has been started.  He does have an expected acute blood loss anemia.  Most recent hemoglobin and hematocrit are 8.0/24.2  He does have a postoperative thrombocytopenia and heparin is being avoided.  Hit panel was negative.  His platelet count improved over the course of his hospital stay.  Most recent level was 67. He does have some postoperative volume overload but is responding to diuretics. These will be continued for a week post discharge then discontinued.  Blood sugars have been under adequate control.  Hemoglobin A1c is borderline elevated at 5.6 but should do well of diet is controlled.  He remained hypertensive and required further titration of his beta blocker and ACE inhibitor.  The patient is tolerating gradually increasing activities using standard protocols.  All routine lines, monitors and drainage devices were removed prior to discharge. At time of discharge the patient is felt to be stable.  Consults: None  Significant Diagnostic Studies: cardiac graphics:  Echocardiogram:   Study Conclusions  - Left ventricle: The cavity size was normal. There was moderate   concentric hypertrophy. Systolic function was normal. The   estimated ejection fraction was in the range of 60% to 65%. Wall   motion was normal; there were no regional wall motion   abnormalities. Features are consistent with a pseudonormal left   ventricular filling  pattern, with concomitant abnormal relaxation   and increased filling pressure (grade 2 diastolic dysfunction). - Aortic valve: Valve mobility was restricted. There was severe   stenosis. There was moderate regurgitation. Mean gradient (S): 48   mm Hg. Peak gradient (S): 89 mm Hg. - Mitral valve: There was mild regurgitation. - Left atrium: The atrium was moderately dilated. - Right ventricle: Systolic function was normal. - Tricuspid valve: There was mild regurgitation. - Pulmonic valve: There was trivial regurgitation.  Impressions:  - There is no significant change since the prior study on   01/11/2017.  Treatments: surgery:   Disposition: 01-Home or Self Care   Discharge Medications:   Allergies as of 07/18/2017      Reactions   Codeine Itching, Rash      Medication List    STOP taking these medications   ibuprofen 200 MG tablet Commonly known as:  ADVIL,MOTRIN     TAKE these medications   aspirin 81 MG tablet Take 81 mg by mouth daily.   atorvastatin 20 MG tablet Commonly known as:  LIPITOR Take 20 mg by mouth daily.   calcipotriene 0.005 % cream Commonly known as:  DOVONOX Apply 1 application topically 2 (two) times daily as needed.   calcium carbonate 750 MG chewable tablet Commonly known as:  TUMS EX Chew 1 tablet by mouth daily as needed for heartburn.   cholecalciferol 1000 units tablet Commonly known as:  VITAMIN D Take 1,000 Units by mouth daily.   clobetasol ointment 0.05 % Commonly known as:  TEMOVATE Apply 1 application topically 2 (two) times daily as needed.   fish oil-omega-3 fatty acids 1000 MG capsule Take 1 g by mouth daily.   furosemide 40 MG tablet Commonly known as:  LASIX Take 1 tablet (40 mg total) by mouth daily. For 7 days   hydroxychloroquine 200 MG tablet Commonly known as:  PLAQUENIL Take 400 mg by mouth daily.   lisinopril 20 MG tablet Commonly known as:  PRINIVIL,ZESTRIL Take 1 tablet (20 mg total) by mouth  daily.   metoprolol tartrate 50 MG tablet Commonly known as:  LOPRESSOR Take 1 tablet (50 mg total) by mouth 2 (two) times daily.   tamsulosin 0.4 MG Caps capsule Commonly known as:  FLOMAX Take 0.4 mg by mouth daily.   traMADol 50 MG tablet Commonly known as:  ULTRAM Take 1-2 tablets (50-100 mg total) by mouth every 4 (four) hours as needed for moderate pain.      Follow-up Information    [name], MD Follow up on 08/17/2017.   Specialty:  Cardiothoracic Surgery Why:  Appointment to see Dr. Dorris Fetch at 10:45 AM.  Please obtain a chest x-ray at [facility] imaging at 10:15 AM.  [facility] imaging is located in the same office complex on the first floor. Contact information: [address] [place] [address] [phone]        Abelino Derrick, PA-C Follow up on 07/30/2017.   Specialties:  Cardiology, Radiology Why:  Appointment is at 11:30 Contact information: [address] [place] [address] [phone]  The patient has been discharged on:   1.Beta Blocker:  Yes [ y  ]                              No   [   ]                              If No, reason:  2.Ace Inhibitor/ARB: Yes [ y  ]                                     No  [    ]                                     If No, reason:  3.Statin:   Yes [ y  ]                  No  [   ]                  If No, reason:  4.Ecasa:  Yes  [ y  ]                  No   [   ]                  If No, reason:   Signed: Marga Gramajo 07/18/2017, 8:10 AM

## 2017-07-16 NOTE — Progress Notes (Signed)
4 Days Post-Op Procedure(s) (LRB): MINIMALLY INVASIVE AORTIC VALVE REPLACEMENT,THORACIC APPROACH, RIGHT MINI THORACOTOMY, TEE (Right) TRANSESOPHAGEAL ECHOCARDIOGRAM (TEE) (N/A) Subjective: Nausea after walk last night Pain is better  Objective: Vital signs in last 24 hours: Temp:  [97.6 F (36.4 C)-98.3 F (36.8 C)] 98.3 F (36.8 C) (12/21 0344) Pulse Rate:  [68-93] 93 (12/20 2234) Cardiac Rhythm: Normal sinus rhythm (12/21 0400) Resp:  [10-25] 18 (12/21 0600) BP: (108-157)/(65-82) 157/70 (12/21 0600) SpO2:  [94 %-99 %] 94 % (12/21 0400) Weight:  [170 lb 13.7 oz (77.5 kg)] 170 lb 13.7 oz (77.5 kg) (12/21 0500)  Hemodynamic parameters for last 24 hours:    Intake/Output from previous day: 12/20 0701 - 12/21 0700 In: 360 [P.O.:360] Out: 1290 [Urine:1250; Chest Tube:40] Intake/Output this shift: No intake/output data recorded.  General appearance: alert, cooperative and no distress Neurologic: intact Heart: regular rate and rhythm Lungs: clear to auscultation bilaterally Abdomen: normal findings: soft, non-tender Wound: clean and dry  Lab Results: Recent Labs    07/14/17 0517 07/15/17 0521  WBC 10.5 8.5  HGB 8.7* 8.8*  HCT 26.7* 26.3*  PLT 42* 35*   BMET:  Recent Labs    07/14/17 0517 07/15/17 0521  NA 130* 130*  K 4.1 4.5  CL 99* 101  CO2 22 25  GLUCOSE 155* 96  BUN 26* 22*  CREATININE 1.20 0.87  CALCIUM 7.0* 7.1*    PT/INR: No results for input(s): LABPROT, INR in the last 72 hours. ABG    Component Value Date/Time   PHART 7.358 07/12/2017 1909   HCO3 20.9 07/12/2017 1909   TCO2 24 07/13/2017 1723   ACIDBASEDEF 4.0 (H) 07/12/2017 1909   O2SAT 99.0 07/12/2017 1909   CBG (last 3)  Recent Labs    07/15/17 1131 07/15/17 1712 07/15/17 2122  GLUCAP 85 81 112*    Assessment/Plan: S/P Procedure(s) (LRB): MINIMALLY INVASIVE AORTIC VALVE REPLACEMENT,THORACIC APPROACH, RIGHT MINI THORACOTOMY, TEE (Right) TRANSESOPHAGEAL ECHOCARDIOGRAM (TEE)  (N/A) Plan for transfer to step-down: see transfer orders  Awaiting transfer to tele CV- in SR. Systolic murmur on exam unchanged  No diastolic murmur  RESP- continue IS  RENAL- labs pending  ENDO- CBG OK, dc SSI  HEME- CBC not done yet this AM  Continue cardiac rehab   LOS: 4 days    [name] 07/16/2017

## 2017-07-16 NOTE — Progress Notes (Signed)
      [address]       [name] [address]             [phone]     \ Still waiting for tele bed  Doing well  Viviann Spare C. Dorris Fetch, MD [facility] [phone]

## 2017-07-17 LAB — CBC
HEMATOCRIT: 24.2 % — AB (ref 39.0–52.0)
HEMOGLOBIN: 8 g/dL — AB (ref 13.0–17.0)
MCH: 28.1 pg (ref 26.0–34.0)
MCHC: 33.1 g/dL (ref 30.0–36.0)
MCV: 84.9 fL (ref 78.0–100.0)
Platelets: 67 10*3/uL — ABNORMAL LOW (ref 150–400)
RBC: 2.85 MIL/uL — AB (ref 4.22–5.81)
RDW: 15.5 % (ref 11.5–15.5)
WBC: 5.5 10*3/uL (ref 4.0–10.5)

## 2017-07-17 MED ORDER — METOPROLOL TARTRATE 25 MG PO TABS
25.0000 mg | ORAL_TABLET | Freq: Two times a day (BID) | ORAL | Status: DC
Start: 2017-07-17 — End: 2017-07-18
  Administered 2017-07-17 (×2): 25 mg via ORAL
  Filled 2017-07-17 (×2): qty 1

## 2017-07-17 NOTE — Progress Notes (Signed)
5 Days Post-Op Procedure(s) (LRB): MINIMALLY INVASIVE AORTIC VALVE REPLACEMENT,THORACIC APPROACH, RIGHT MINI THORACOTOMY, TEE (Right) TRANSESOPHAGEAL ECHOCARDIOGRAM (TEE) (N/A) Subjective: Some pain. Nausea resolved  Objective: Vital signs in last 24 hours: Temp:  [98 F (36.7 C)-98.3 F (36.8 C)] 98 F (36.7 C) (12/22 0300) Pulse Rate:  [76-78] 76 (12/22 0300) Cardiac Rhythm: Normal sinus rhythm (12/22 0821) Resp:  [17-29] 17 (12/22 0608) BP: (109-176)/(66-96) 148/82 (12/22 0608) SpO2:  [94 %-98 %] 98 % (12/22 0608) Weight:  [171 lb 8.3 oz (77.8 kg)] 171 lb 8.3 oz (77.8 kg) (12/22 2025)  Hemodynamic parameters for last 24 hours:    Intake/Output from previous day: 12/21 0701 - 12/22 0700 In: 960 [P.O.:960] Out: 900 [Urine:300; Emesis/NG output:600] Intake/Output this shift: Total I/O In: 200 [P.O.:200] Out: -   General appearance: alert, cooperative and no distress Neurologic: intact Heart: regular rate and rhythm and +systolic murmur, no diastolic murmur Lungs: diminished breath sounds bibasilar Abdomen: normal findings: soft, non-tender Wound: clean and dry  Lab Results: Recent Labs    07/16/17 0736 07/17/17 0255  WBC 6.3 5.5  HGB 8.7* 8.0*  HCT 26.4* 24.2*  PLT 58* 67*   BMET:  Recent Labs    07/15/17 0521 07/16/17 0736  NA 130* 130*  K 4.5 4.4  CL 101 100*  CO2 25 26  GLUCOSE 96 93  BUN 22* 15  CREATININE 0.87 0.79  CALCIUM 7.1* 7.3*    PT/INR: No results for input(s): LABPROT, INR in the last 72 hours. ABG    Component Value Date/Time   PHART 7.358 07/12/2017 1909   HCO3 20.9 07/12/2017 1909   TCO2 24 07/13/2017 1723   ACIDBASEDEF 4.0 (H) 07/12/2017 1909   O2SAT 99.0 07/12/2017 1909   CBG (last 3)  Recent Labs    07/16/17 1300 07/16/17 1337 07/16/17 1456  GLUCAP 56* 44* 120*    Assessment/Plan: S/P Procedure(s) (LRB): MINIMALLY INVASIVE AORTIC VALVE REPLACEMENT,THORACIC APPROACH, RIGHT MINI THORACOTOMY, TEE  (Right) TRANSESOPHAGEAL ECHOCARDIOGRAM (TEE) (N/A) Still awaiting bed on 4E  CV- stable in SR- dc pacing wires  pericardial valve- ASA only, no coumadin  RESP- continue IS for basilar atelectasis  RENAL- no issues  ENDO- hypoglycemia yesterday- resolved   Nausea- better with scopolamine patch  Probably home tomorrow   LOS: 5 days    [name] 07/17/2017

## 2017-07-17 NOTE — Progress Notes (Signed)
Cardiac Rehab Note: Cardiac Rehab RN in to walk with patient. Patient on bedrest for next hour related to pacer wires being pulled. Encouraged patient to ambulate in hallway TID once bedrest up if ok with floor RN. Will follow up with patient on Monday.

## 2017-07-17 NOTE — Progress Notes (Signed)
EPW d/c'd per order and protocol. Ends intact. Pt tolerated well. Advised bedrest x 1 hour. V/S collected per order. Call bell in reach. Marya Landry, RN

## 2017-07-17 NOTE — Progress Notes (Signed)
Received from 2heart to room 4e9 alert and oriented. N/c of pain or any needs. Wound CDI.  Oriented to unit and plan of care. Marya Landry, RN

## 2017-07-18 MED ORDER — METOPROLOL TARTRATE 50 MG PO TABS: 50.0000 mg | ORAL_TABLET | Freq: Two times a day (BID) | ORAL | 3 refills | Status: DC

## 2017-07-18 MED ORDER — LISINOPRIL 20 MG PO TABS: 20.0000 mg | ORAL_TABLET | Freq: Every day | ORAL | 3 refills | Status: DC

## 2017-07-18 MED ORDER — FUROSEMIDE 40 MG PO TABS: 40.0000 mg | ORAL_TABLET | Freq: Every day | ORAL | 0 refills | Status: DC

## 2017-07-18 MED ORDER — METOPROLOL TARTRATE 50 MG PO TABS
50.0000 mg | ORAL_TABLET | Freq: Two times a day (BID) | ORAL | Status: DC
  Administered 2017-07-18: 50 mg via ORAL
  Filled 2017-07-18: qty 1

## 2017-07-18 MED ORDER — LISINOPRIL 10 MG PO TABS
20.0000 mg | ORAL_TABLET | Freq: Every day | ORAL | Status: DC
  Administered 2017-07-18: 20 mg via ORAL
  Filled 2017-07-18: qty 2

## 2017-07-18 MED ORDER — TRAMADOL HCL 50 MG PO TABS: 50.0000 mg | ORAL_TABLET | ORAL | 0 refills | Status: DC | PRN

## 2017-07-18 NOTE — Progress Notes (Addendum)
      [address]       [place] [address]             [phone]      6 Days Post-Op Procedure(s) (LRB): MINIMALLY INVASIVE AORTIC VALVE REPLACEMENT,THORACIC APPROACH, RIGHT MINI THORACOTOMY, TEE (Right) TRANSESOPHAGEAL ECHOCARDIOGRAM (TEE) (N/A)   Subjective:  Patient states doing fine.  He denies chest pain and shortness of breath.  Wants to go home states he has been here 7 days and just cant rest here.  + BM  Objective: Vital signs in last 24 hours: Temp:  [97.3 F (36.3 C)-97.6 F (36.4 C)] 97.6 F (36.4 C) (12/23 0603) Pulse Rate:  [70-108] 108 (12/23 0714) Cardiac Rhythm: Normal sinus rhythm;Bundle branch block (12/23 0700) Resp:  [16-29] 28 (12/23 0717) BP: (136-180)/(71-92) 168/80 (12/23 0717) SpO2:  [97 %-100 %] 98 % (12/23 0603)  Intake/Output from previous day: 12/22 0701 - 12/23 0700 In: 300 [P.O.:300] Out: 250 [Urine:250]  General appearance: alert, cooperative and no distress Heart: regular rate and rhythm Lungs: clear to auscultation bilaterally Abdomen: soft, non-tender; bowel sounds normal; no masses,  no organomegaly Extremities: edema trace Wound: clean and dry  Lab Results: Recent Labs    07/16/17 0736 07/17/17 0255  WBC 6.3 5.5  HGB 8.7* 8.0*  HCT 26.4* 24.2*  PLT 58* 67*   BMET:  Recent Labs    07/16/17 0736  NA 130*  K 4.4  CL 100*  CO2 26  GLUCOSE 93  BUN 15  CREATININE 0.79  CALCIUM 7.3*    PT/INR: No results for input(s): LABPROT, INR in the last 72 hours. ABG    Component Value Date/Time   PHART 7.358 07/12/2017 1909   HCO3 20.9 07/12/2017 1909   TCO2 24 07/13/2017 1723   ACIDBASEDEF 4.0 (H) 07/12/2017 1909   O2SAT 99.0 07/12/2017 1909   CBG (last 3)  Recent Labs    07/16/17 1300 07/16/17 1337 07/16/17 1456  GLUCAP 56* 44* 120*    Assessment/Plan: S/P Procedure(s) (LRB): MINIMALLY INVASIVE AORTIC VALVE REPLACEMENT,THORACIC APPROACH, RIGHT MINI THORACOTOMY, TEE (Right) TRANSESOPHAGEAL  ECHOCARDIOGRAM (TEE) (N/A)  1. CV- NSR, HTN 140-180s- will increase Lopressor to 50 mg BID, titrate Lisinopril to 20 mg daily 2. Pulm- no acute issues, continue IS 3. Renal- creatinine stable, weight remains elevated, will continue Lasix 4. Expected post operative blood loss anemia, mild Hgb stable at 8.0, patient asymptomatic 5. Thrombocytopenia- improving, HIT is negative 6. Dispo- patient stable, medications titrated for HTN, continue diuretics, will d/c home today   LOS: 6 days    Lowella Dandy 07/18/2017 Patient seen and examined, agree with above Home today ASA only with pericardial valve  Viviann Spare C. Dorris Fetch, MD [facility] [phone]

## 2017-07-18 NOTE — Progress Notes (Signed)
Patient and wife given discharge instructions medication list and paper prescriptions. Patient and significant other verbalized understanding on follow up appointments. IV and tele were removed. CT sutures removed as ordered and steri strips applied. All questions were answered. Discharged homeas  Ordered, transported via wheelchair to exit with nursing staff. Tyrell Seifer, Randall An rN

## 2017-07-21 ENCOUNTER — Ambulatory Visit
Admission: RE | Admit: 2017-07-21 | Discharge: 2017-07-21 | Disposition: A | Discharge status: Home / Self Care | Payer: Commercial Managed Care - PPO | Source: Ambulatory Visit | Attending: Surgical | Admitting: Surgical

## 2017-07-21 ENCOUNTER — Ambulatory Visit (INDEPENDENT_AMBULATORY_CARE_PROVIDER_SITE_OTHER): Payer: Self-pay | Admitting: Physician Assistant

## 2017-07-21 ENCOUNTER — Other Ambulatory Visit: Payer: Self-pay | Admitting: *Deleted

## 2017-07-21 DIAGNOSIS — R110 Nausea: Secondary | ICD-10-CM

## 2017-07-21 DIAGNOSIS — R05 Cough: Secondary | ICD-10-CM | POA: Diagnosis not present

## 2017-07-21 DIAGNOSIS — J90 Pleural effusion, not elsewhere classified: Secondary | ICD-10-CM

## 2017-07-21 DIAGNOSIS — E876 Hypokalemia: Secondary | ICD-10-CM

## 2017-07-21 MED ORDER — SCOPOLAMINE 1 MG/3DAYS TD PT72: 1.0000 | MEDICATED_PATCH | TRANSDERMAL | 1 refills | Status: DC

## 2017-07-21 MED ORDER — POTASSIUM CHLORIDE CRYS ER 20 MEQ PO TBCR: 20.0000 meq | EXTENDED_RELEASE_TABLET | Freq: Every day | ORAL | 0 refills | Status: DC

## 2017-07-21 NOTE — Patient Instructions (Addendum)
Please follow-up at your regular scheduled appointment unless other issues arise.  Dress the chest tube incision with a bulky 4 x 4 dressing and tape.  Change as needed.

## 2017-07-21 NOTE — Progress Notes (Signed)
Luis Ferguson is a 56 y.o. male patient status post a minimally invasive aortic valve replacement by Dr. Dorris FetchHendrickson.  He was discharged from the hospital a few days ago and noticed copious amounts of drainage coming from his chest tube site.  The drainage increases when he coughs.   1. Nausea   2. Hypokalemia    Past Medical History:  Diagnosis Date  . Aortic stenosis   . Arthritis    achiness in shoulder , elbows  . Connective tissue disease (HCC)    followed by Dr. Kathi LudwigSyed, GMA, pt. off Prednisone since mid 2017  . Fatigue   . Psoriasis    below the knee on both legs    No past surgical history pertinent negatives on file. Scheduled Meds: Current Outpatient Medications on File Prior to Visit  Medication Sig Dispense Refill  . aspirin 81 MG tablet Take 81 mg by mouth daily.    [name]. atorvastatin (LIPITOR) 20 MG tablet Take 20 mg by mouth daily.     . calcipotriene (DOVONOX) 0.005 % cream Apply 1 application topically 2 (two) times daily as needed.     . calcium carbonate (TUMS EX) 750 MG chewable tablet Chew 1 tablet by mouth daily as needed for heartburn.    . cholecalciferol (VITAMIN D) 1000 UNITS tablet Take 1,000 Units by mouth daily.    . clobetasol ointment (TEMOVATE) 0.05 % Apply 1 application topically 2 (two) times daily as needed.     . fish oil-omega-3 fatty acids 1000 MG capsule Take 1 g by mouth daily.     . furosemide (LASIX) 40 MG tablet Take 1 tablet (40 mg total) by mouth daily. For 7 days 7 tablet 0  . hydroxychloroquine (PLAQUENIL) 200 MG tablet Take 400 mg by mouth daily.     [name]. lisinopril (PRINIVIL,ZESTRIL) 20 MG tablet Take 1 tablet (20 mg total) by mouth daily. 30 tablet 3  . metoprolol tartrate (LOPRESSOR) 50 MG tablet Take 1 tablet (50 mg total) by mouth 2 (two) times daily. 60 tablet 3  . tamsulosin (FLOMAX) 0.4 MG CAPS Take 0.4 mg by mouth daily.     . traMADol (ULTRAM) 50 MG tablet Take 1-2 tablets (50-100 mg total) by mouth every 4 (four) hours as needed for  moderate pain. 30 tablet 0   No current facility-administered medications on file prior to visit.     Allergies  Allergen Reactions  . Codeine Itching and Rash    There were no vitals taken for this visit.  Subjective: The patient is here for a chest tube site check.  He is noticed quite a bit of drainage coming from the site over the last few days. Objective  Cor: Regular rate and rhythm without murmur Pulm: Clear to auscultation bilaterally and fields Abd: No tenderness Wound: Thoracotomy site is healing well.  There is a small amount of localized erythema at the chest tube site.  The drainage is clear to yellow tinged. Extremity: No edema.  CLINICAL DATA:  Bilateral pleural effusion. Status post aortic replacement. Weakness, cough.  EXAM: CHEST  2 VIEW  COMPARISON:  07/16/2017  FINDINGS: Improving lung volumes with decreasing bibasilar atelectasis. Small effusions remain. Changes of prior aortic valve replacement. Mild cardiomegaly.  IMPRESSION: Improving aeration with decreasing bibasilar atelectasis. Continued small effusions. No pneumothorax.   Electronically Signed   By: Charlett NoseKevin  Dover M.D.   On: 07/21/2017 11:19    Assessment & Plan  The patient is a 56 year old man who presents today for  a incision check.  He is noticed copious amounts of drainage coming from his right chest tube site.  His sutures were removed after discharge.  The site is moist with clear to yellow tinged drainage when the patient coughs.  The drainage does not appear infected.  The patient has also had some vomiting over the last few days since the scopolamine patch has worn off.  The patient has been on a few days of Lasix and was prescribed a full week.  He should finish his Lasix on 07/25/2017.  I reviewed the chest x-ray with the patient and wife.  There were a very small pleural effusion which have improved since his hospital stay.  I encouraged him to continue his Lasix and to give  our office a call if the drainage continues to require greater than 10 dressing changes a day.  I supplied the patient with 4 x 4 sterile dressings and tape since they were using paper towels at this point to stop the drainage.  I also suggested continuing cough drops since the cough does appear to be dry and nonproductive.  The patient states the cough drops help and the drainage slows down when he is not continually coughing.  I prescribed the patient 4 days of potassium tabs to cover the remainder of the Lasix that he will be taking.  I also prescribed a scopolamine patch so that his nausea improves and hopefully his vomiting stops.  His wife is concerned about him appearing jaundiced.  We did not order any LFTs in the hospital but I did suggest when he goes for his physical in January that they do a full panel of blood work.  I encouraged the patient to call our office if the drainage does not slow down over the next week.  We may need to increase his diuretic regimen.  I explained that there is not enough fluid on the chest x-ray to do a thoracentesis.  The patient and wife understood and questions at this time.  We will plan to see the patient at his follow-up appointment in a few weeks unless other issues arise.  Sharlene Dory 07/21/2017

## 2017-07-26 ENCOUNTER — Telehealth (HOSPITAL_COMMUNITY): Payer: Self-pay | Admitting: *Deleted

## 2017-07-26 NOTE — Telephone Encounter (Signed)
Pt discharged from hospital prior to receiving advisement from Cardiac Rehab Phase I.  Message left for pt to call back.  Contact information provided. Alanson Aly, BSN Cardiac and Emergency planning/management officer

## 2017-07-30 ENCOUNTER — Ambulatory Visit: Payer: Commercial Managed Care - PPO | Admitting: Cardiology

## 2017-07-30 ENCOUNTER — Encounter: Payer: Self-pay | Admitting: Cardiology

## 2017-07-30 VITALS — BP 115/58 | HR 84 | Ht 66.0 in | Wt 150.0 lb

## 2017-07-30 DIAGNOSIS — D649 Anemia, unspecified: Secondary | ICD-10-CM

## 2017-07-30 DIAGNOSIS — D500 Iron deficiency anemia secondary to blood loss (chronic): Secondary | ICD-10-CM

## 2017-07-30 DIAGNOSIS — R059 Cough, unspecified: Secondary | ICD-10-CM | POA: Insufficient documentation

## 2017-07-30 DIAGNOSIS — Z952 Presence of prosthetic heart valve: Secondary | ICD-10-CM | POA: Diagnosis not present

## 2017-07-30 DIAGNOSIS — R05 Cough: Secondary | ICD-10-CM | POA: Diagnosis not present

## 2017-07-30 MED ORDER — PANTOPRAZOLE SODIUM 40 MG PO TBEC: 40.0000 mg | DELAYED_RELEASE_TABLET | Freq: Every day | ORAL | 0 refills | Status: DC

## 2017-07-30 MED ORDER — FERROUS SULFATE 325 (65 FE) MG PO TBEC: 325.0000 mg | DELAYED_RELEASE_TABLET | Freq: Two times a day (BID) | ORAL | 0 refills | Status: DC

## 2017-07-30 NOTE — Patient Instructions (Signed)
Medication Instructions:  START ferrous sulfate 325mg  two times daily with meals  START Protonix 40 mg daily on an empty stomach  Labwork: CBC TODAY  Follow-Up: Your physician recommends that you schedule a follow-up appointment in: 6 weeks with Dr. Rennis Golden.    Any Other Special Instructions Will Be Listed Below (If Applicable).     If you need a refill on your cardiac medications before your next appointment, please call your pharmacy.

## 2017-07-30 NOTE — Assessment & Plan Note (Signed)
PO Iron added, check CBC

## 2017-07-30 NOTE — Assessment & Plan Note (Signed)
Minimally invasive AVR 07/12/17- Dr Dorris Fetch

## 2017-07-30 NOTE — Progress Notes (Signed)
07/30/2017 Luis Ferguson   [birth year]  [id number]  Primary Physician Milus Height, PA-C Primary Cardiologist: Dr Rennis Golden  HPI:  57 y/o male followed by Dr Rennis Golden since 2016 for bicuspid AOV and AS. He recenttly underwent minimally invasive AVR 07/12/17. He is in the office today with his wife. Since discharge he has been weak. This is slowly improving. His main complaint is a cough. He had this pre op as well but is was worse post op. CXR done as an OP post op 07/21/17 showed improving aeration. His Lisinopril was stopped 5 days ago and his wife thinks there may be some improvement  But he still coughs every night. He denies orthopnea or edema.    Current Outpatient Medications  Medication Sig Dispense Refill  . aspirin 81 MG tablet Take 81 mg by mouth daily.    [name] atorvastatin (LIPITOR) 20 MG tablet Take 20 mg by mouth daily.     . calcipotriene (DOVONOX) 0.005 % cream Apply 1 application topically 2 (two) times daily as needed.     . calcium carbonate (TUMS EX) 750 MG chewable tablet Chew 1 tablet by mouth daily as needed for heartburn.    . cholecalciferol (VITAMIN D) 1000 UNITS tablet Take 1,000 Units by mouth daily.    . clobetasol ointment (TEMOVATE) 0.05 % Apply 1 application topically 2 (two) times daily as needed.     . fish oil-omega-3 fatty acids 1000 MG capsule Take 1 g by mouth daily.     . hydroxychloroquine (PLAQUENIL) 200 MG tablet Take 400 mg by mouth daily.     . metoprolol tartrate (LOPRESSOR) 50 MG tablet Take 1 tablet (50 mg total) by mouth 2 (two) times daily. 60 tablet 3  . scopolamine (TRANSDERM-SCOP) 1 MG/3DAYS Place 1 patch (1.5 mg total) onto the skin every 3 (three) days. 3 patch 1  . tamsulosin (FLOMAX) 0.4 MG CAPS Take 0.4 mg by mouth daily.     . ferrous sulfate 325 (65 FE) MG EC tablet Take 1 tablet (325 mg total) by mouth 2 (two) times daily with a meal. 180 tablet 0  . pantoprazole (PROTONIX) 40 MG tablet Take 1 tablet (40 mg total) by mouth daily. 90  tablet 0  . potassium chloride SA (K-DUR,KLOR-CON) 20 MEQ tablet Take 1 tablet (20 mEq total) by mouth daily for 4 days. Take until you finish the Lasix. 4 tablet 0   No current facility-administered medications for this visit.     Allergies  Allergen Reactions  . Ace Inhibitors Cough  . Codeine Itching and Rash    Past Medical History:  Diagnosis Date  . Aortic stenosis   . Arthritis    achiness in shoulder , elbows  . Connective tissue disease (HCC)    followed by Dr. Kathi Ludwig, GMA, pt. off Prednisone since mid 2017  . Fatigue   . Psoriasis    below the knee on both legs     Social History   Socioeconomic History  . Marital status: Married    Spouse name: Not on file  . Number of children: Not on file  . Years of education: Not on file  . Highest education level: Not on file  Social Needs  . Financial resource strain: Not on file  . Food insecurity - worry: Not on file  . Food insecurity - inability: Not on file  . Transportation needs - medical: Not on file  . Transportation needs - non-medical: Not on file  Occupational History  .  Not on file  Tobacco Use  . Smoking status: Never Smoker  . Smokeless tobacco: Never Used  Substance and Sexual Activity  . Alcohol use: No  . Drug use: No  . Sexual activity: Yes  Other Topics Concern  . Not on file  Social History Narrative  . Not on file     Family History  Problem Relation Age of Onset  . Diabetes Mother        also HTN  . CAD Father 19       CABGx3  . Liver cancer Brother   . Diabetes Brother      Review of Systems: General: negative for chills, fever, night sweats or weight changes.  Cardiovascular: negative for chest pain, dyspnea on exertion, edema, orthopnea, palpitations, paroxysmal nocturnal dyspnea or shortness of breath Dermatological: negative for rash Respiratory: negative for cough or wheezing Urologic: negative for hematuria Abdominal: negative for nausea, vomiting, diarrhea, bright red  blood per rectum, melena, or hematemesis Neurologic: negative for visual changes, syncope, or dizziness All other systems reviewed and are otherwise negative except as noted above.    Blood pressure (!) 115/58, pulse 84, height 5\' 6"  (1.676 m), weight 150 lb (68 kg).  General appearance: alert, cooperative and no distress Neck: no JVD and transmitted murmur Lungs: clear to auscultation bilaterally Heart: regular rate and rhythm and 2-3/6 systolic murmur AOV and LSB and 1-2/6 diastlic mumru at LSB Extremities: no edema Skin: cool, dry, pale Neurologic: Grossly normal  EKG NSR, LVH, repol TWI  ASSESSMENT AND PLAN:   S/P AVR Minimally invasive AVR 07/12/17- Dr Dorris Fetch  Cough Chronic cough, ACE stopped, PPI added  Blood loss anemia PO Iron added, check CBC   PLAN  I suggested we get a CBC today. He really looks pale and weak. His Hgb was 8.0 at discharge and trending down. I suggested we add PO Iron.  I also added Protonix in case he has GERD cough. He may eventually need an allergy or pulmonary referral since this bothersome to him and chronic. He should stay his ACE.   F/U Dr Rennis Golden in 6 weeks.   Corine Shelter PA-C 07/30/2017 12:05 PM

## 2017-07-30 NOTE — Assessment & Plan Note (Signed)
Chronic cough, ACE stopped, PPI added

## 2017-07-31 LAB — CBC
Hematocrit: 19.3 % — ABNORMAL LOW (ref 37.5–51.0)
Hemoglobin: 6.2 g/dL — CL (ref 13.0–17.7)
MCH: 27.6 pg (ref 26.6–33.0)
MCHC: 32.1 g/dL (ref 31.5–35.7)
MCV: 86 fL (ref 79–97)
Platelets: 167 10*3/uL (ref 150–379)
RBC: 2.25 x10E6/uL — CL (ref 4.14–5.80)
RDW: 21.2 % — ABNORMAL HIGH (ref 12.3–15.4)
WBC: 5.2 10*3/uL (ref 3.4–10.8)

## 2017-08-05 ENCOUNTER — Observation Stay (HOSPITAL_COMMUNITY): Payer: Commercial Managed Care - PPO

## 2017-08-05 ENCOUNTER — Other Ambulatory Visit: Payer: Self-pay

## 2017-08-05 ENCOUNTER — Encounter (HOSPITAL_COMMUNITY): Payer: Self-pay | Admitting: Emergency Medicine

## 2017-08-05 ENCOUNTER — Inpatient Hospital Stay (HOSPITAL_COMMUNITY)
Admission: EM | Admit: 2017-08-05 | Discharge: 2017-08-07 | DRG: 812 | Disposition: A | Discharge status: Home / Self Care | Payer: Commercial Managed Care - PPO | Attending: Internal Medicine | Admitting: Internal Medicine

## 2017-08-05 DIAGNOSIS — Q231 Congenital insufficiency of aortic valve: Secondary | ICD-10-CM

## 2017-08-05 DIAGNOSIS — I350 Nonrheumatic aortic (valve) stenosis: Secondary | ICD-10-CM | POA: Diagnosis not present

## 2017-08-05 DIAGNOSIS — D500 Iron deficiency anemia secondary to blood loss (chronic): Secondary | ICD-10-CM | POA: Diagnosis present

## 2017-08-05 DIAGNOSIS — L930 Discoid lupus erythematosus: Secondary | ICD-10-CM

## 2017-08-05 DIAGNOSIS — K2100 Gastro-esophageal reflux disease with esophagitis, without bleeding: Secondary | ICD-10-CM

## 2017-08-05 DIAGNOSIS — D649 Anemia, unspecified: Secondary | ICD-10-CM | POA: Diagnosis present

## 2017-08-05 DIAGNOSIS — R05 Cough: Secondary | ICD-10-CM

## 2017-08-05 DIAGNOSIS — Z953 Presence of xenogenic heart valve: Secondary | ICD-10-CM

## 2017-08-05 DIAGNOSIS — E440 Moderate protein-calorie malnutrition: Secondary | ICD-10-CM

## 2017-08-05 DIAGNOSIS — Z952 Presence of prosthetic heart valve: Secondary | ICD-10-CM | POA: Diagnosis not present

## 2017-08-05 DIAGNOSIS — R0600 Dyspnea, unspecified: Secondary | ICD-10-CM | POA: Diagnosis not present

## 2017-08-05 DIAGNOSIS — D589 Hereditary hemolytic anemia, unspecified: Secondary | ICD-10-CM | POA: Diagnosis not present

## 2017-08-05 DIAGNOSIS — K219 Gastro-esophageal reflux disease without esophagitis: Secondary | ICD-10-CM | POA: Insufficient documentation

## 2017-08-05 DIAGNOSIS — M329 Systemic lupus erythematosus, unspecified: Secondary | ICD-10-CM | POA: Diagnosis not present

## 2017-08-05 DIAGNOSIS — R059 Cough, unspecified: Secondary | ICD-10-CM

## 2017-08-05 DIAGNOSIS — R740 Nonspecific elevation of levels of transaminase and lactic acid dehydrogenase [LDH]: Secondary | ICD-10-CM | POA: Diagnosis present

## 2017-08-05 DIAGNOSIS — Z79899 Other long term (current) drug therapy: Secondary | ICD-10-CM

## 2017-08-05 DIAGNOSIS — Z6825 Body mass index (BMI) 25.0-25.9, adult: Secondary | ICD-10-CM

## 2017-08-05 DIAGNOSIS — D594 Other nonautoimmune hemolytic anemias: Secondary | ICD-10-CM | POA: Diagnosis not present

## 2017-08-05 DIAGNOSIS — D598 Other acquired hemolytic anemias: Secondary | ICD-10-CM | POA: Diagnosis not present

## 2017-08-05 DIAGNOSIS — E785 Hyperlipidemia, unspecified: Secondary | ICD-10-CM

## 2017-08-05 DIAGNOSIS — J9811 Atelectasis: Secondary | ICD-10-CM | POA: Diagnosis not present

## 2017-08-05 DIAGNOSIS — L409 Psoriasis, unspecified: Secondary | ICD-10-CM | POA: Diagnosis present

## 2017-08-05 DIAGNOSIS — D696 Thrombocytopenia, unspecified: Secondary | ICD-10-CM | POA: Diagnosis not present

## 2017-08-05 DIAGNOSIS — Z7982 Long term (current) use of aspirin: Secondary | ICD-10-CM

## 2017-08-05 DIAGNOSIS — D590 Drug-induced autoimmune hemolytic anemia: Secondary | ICD-10-CM | POA: Diagnosis not present

## 2017-08-05 LAB — BASIC METABOLIC PANEL
Anion gap: 8 (ref 5–15)
BUN: 18 mg/dL (ref 6–20)
CALCIUM: 8.5 mg/dL — AB (ref 8.9–10.3)
CHLORIDE: 106 mmol/L (ref 101–111)
CO2: 22 mmol/L (ref 22–32)
CREATININE: 0.91 mg/dL (ref 0.61–1.24)
GFR calc non Af Amer: >60 mL/min (ref >60)
GLUCOSE: 96 mg/dL (ref 65–99)
Potassium: 4 mmol/L (ref 3.5–5.1)
Sodium: 136 mmol/L (ref 135–145)

## 2017-08-05 LAB — CBC
HCT: 23.1 % — ABNORMAL LOW (ref 39.0–52.0)
HCT: 18.7 % — ABNORMAL LOW (ref 39.0–52.0)
Hemoglobin: 6.7 g/dL — CL (ref 13.0–17.0)
Hemoglobin: 5.6 g/dL — CL (ref 13.0–17.0)
MCH: 27 pg (ref 26.0–34.0)
MCH: 27.7 pg (ref 26.0–34.0)
MCHC: 29 g/dL — AB (ref 30.0–36.0)
MCHC: 29.9 g/dL — AB (ref 30.0–36.0)
MCV: 93.1 fL (ref 78.0–100.0)
MCV: 92.6 fL (ref 78.0–100.0)
PLATELETS: 154 10*3/uL (ref 150–400)
PLATELETS: 110 10*3/uL — AB (ref 150–400)
RBC: 2.48 MIL/uL — AB (ref 4.22–5.81)
RBC: 2.02 MIL/uL — AB (ref 4.22–5.81)
RDW: 26.6 % — AB (ref 11.5–15.5)
RDW: 26.5 % — ABNORMAL HIGH (ref 11.5–15.5)
WBC: 6 10*3/uL (ref 4.0–10.5)
WBC: 5.3 10*3/uL (ref 4.0–10.5)

## 2017-08-05 LAB — POC OCCULT BLOOD, ED: Fecal Occult Bld: NEGATIVE

## 2017-08-05 LAB — URINALYSIS, ROUTINE W REFLEX MICROSCOPIC
GLUCOSE, UA: NEGATIVE mg/dL
Ketones, ur: NEGATIVE mg/dL
Leukocytes, UA: NEGATIVE
Nitrite: NEGATIVE
PROTEIN: 100 mg/dL — AB
Specific Gravity, Urine: 1.025 (ref 1.005–1.030)
pH: 5.5 (ref 5.0–8.0)

## 2017-08-05 LAB — PROTIME-INR
INR: 1.3
PROTHROMBIN TIME: 16.1 s — AB (ref 11.4–15.2)

## 2017-08-05 LAB — DIRECT ANTIGLOBULIN TEST (NOT AT ARMC)
DAT, IGG: NEGATIVE
DAT, complement: NEGATIVE

## 2017-08-05 LAB — APTT: aPTT: 29 seconds (ref 24–36)

## 2017-08-05 LAB — LACTATE DEHYDROGENASE: LDH: 1647 U/L — ABNORMAL HIGH (ref 98–192)

## 2017-08-05 LAB — URINALYSIS, MICROSCOPIC (REFLEX): SQUAMOUS EPITHELIAL / LPF: NONE SEEN

## 2017-08-05 LAB — BRAIN NATRIURETIC PEPTIDE: B NATRIURETIC PEPTIDE 5: 627.5 pg/mL — AB (ref 0.0–100.0)

## 2017-08-05 LAB — PREPARE RBC (CROSSMATCH)

## 2017-08-05 MED ORDER — POLYETHYLENE GLYCOL 3350 17 G PO PACK: 17.0000 g | PACK | Freq: Every day | ORAL | Status: DC | PRN

## 2017-08-05 MED ORDER — CALCIUM CARBONATE ANTACID 500 MG PO CHEW: 500.0000 mg | CHEWABLE_TABLET | Freq: Every day | ORAL | Status: DC | PRN

## 2017-08-05 MED ORDER — HYDROXYCHLOROQUINE SULFATE 200 MG PO TABS
400.0000 mg | ORAL_TABLET | Freq: Every day | ORAL | Status: DC
  Administered 2017-08-06 – 2017-08-07 (×2): 400 mg via ORAL
  Filled 2017-08-05 (×2): qty 2

## 2017-08-05 MED ORDER — FUROSEMIDE 10 MG/ML IJ SOLN
20.0000 mg | Freq: Once | INTRAMUSCULAR | Status: AC
  Administered 2017-08-05: 20 mg via INTRAVENOUS
  Filled 2017-08-05: qty 2

## 2017-08-05 MED ORDER — FERROUS SULFATE 325 (65 FE) MG PO TABS
325.0000 mg | ORAL_TABLET | Freq: Two times a day (BID) | ORAL | Status: DC
  Administered 2017-08-06 – 2017-08-07 (×3): 325 mg via ORAL
  Filled 2017-08-05 (×4): qty 1

## 2017-08-05 MED ORDER — ACETAMINOPHEN 650 MG RE SUPP: 650.0000 mg | Freq: Four times a day (QID) | RECTAL | Status: DC | PRN

## 2017-08-05 MED ORDER — ATORVASTATIN CALCIUM 20 MG PO TABS
20.0000 mg | ORAL_TABLET | Freq: Every day | ORAL | Status: DC
  Administered 2017-08-06 – 2017-08-07 (×2): 20 mg via ORAL
  Filled 2017-08-05 (×2): qty 1

## 2017-08-05 MED ORDER — SODIUM CHLORIDE 0.9 % IV SOLN: INTRAVENOUS | Status: DC

## 2017-08-05 MED ORDER — HYDRALAZINE HCL 20 MG/ML IJ SOLN: 5.0000 mg | INTRAMUSCULAR | Status: DC | PRN

## 2017-08-05 MED ORDER — SODIUM CHLORIDE 0.9 % IV BOLUS (SEPSIS)
1000.0000 mL | Freq: Once | INTRAVENOUS | Status: AC
  Administered 2017-08-05: 1000 mL via INTRAVENOUS

## 2017-08-05 MED ORDER — ONDANSETRON HCL 4 MG PO TABS: 4.0000 mg | ORAL_TABLET | Freq: Four times a day (QID) | ORAL | Status: DC | PRN

## 2017-08-05 MED ORDER — SODIUM CHLORIDE 0.9 % IV SOLN
Freq: Once | INTRAVENOUS | Status: AC
  Administered 2017-08-05: 19:00:00 via INTRAVENOUS

## 2017-08-05 MED ORDER — ZOLPIDEM TARTRATE 5 MG PO TABS: 5.0000 mg | ORAL_TABLET | Freq: Every evening | ORAL | Status: DC | PRN

## 2017-08-05 MED ORDER — SODIUM CHLORIDE 0.9 % IV BOLUS (SEPSIS): 500.0000 mL | Freq: Once | INTRAVENOUS | Status: DC

## 2017-08-05 MED ORDER — PANTOPRAZOLE SODIUM 40 MG PO TBEC
40.0000 mg | DELAYED_RELEASE_TABLET | Freq: Every day | ORAL | Status: DC
  Administered 2017-08-06 – 2017-08-07 (×2): 40 mg via ORAL
  Filled 2017-08-05 (×2): qty 1

## 2017-08-05 MED ORDER — ACETAMINOPHEN 325 MG PO TABS
650.0000 mg | ORAL_TABLET | Freq: Four times a day (QID) | ORAL | Status: DC | PRN
  Administered 2017-08-06: 650 mg via ORAL
  Filled 2017-08-05: qty 2

## 2017-08-05 MED ORDER — CALCIPOTRIENE 0.005 % EX CREA: 1.0000 "application " | TOPICAL_CREAM | Freq: Two times a day (BID) | CUTANEOUS | Status: DC | PRN

## 2017-08-05 MED ORDER — ONDANSETRON HCL 4 MG/2ML IJ SOLN: 4.0000 mg | Freq: Four times a day (QID) | INTRAMUSCULAR | Status: DC | PRN

## 2017-08-05 MED ORDER — SODIUM CHLORIDE 0.45 % IV SOLN: INTRAVENOUS | Status: DC

## 2017-08-05 MED ORDER — CLOBETASOL PROPIONATE 0.05 % EX OINT: 1.0000 "application " | TOPICAL_OINTMENT | Freq: Two times a day (BID) | CUTANEOUS | Status: DC | PRN

## 2017-08-05 MED ORDER — DEXAMETHASONE SODIUM PHOSPHATE 10 MG/ML IJ SOLN
10.0000 mg | Freq: Three times a day (TID) | INTRAMUSCULAR | Status: DC
  Administered 2017-08-06 (×2): 10 mg via INTRAVENOUS
  Filled 2017-08-05 (×2): qty 1

## 2017-08-05 MED ORDER — VITAMIN D 1000 UNITS PO TABS
1000.0000 [IU] | ORAL_TABLET | Freq: Every day | ORAL | Status: DC
  Administered 2017-08-06 – 2017-08-07 (×2): 1000 [IU] via ORAL
  Filled 2017-08-05 (×2): qty 1

## 2017-08-05 NOTE — ED Notes (Signed)
Per main lab, will add on reticulocytes from previous [facility]

## 2017-08-05 NOTE — ED Notes (Signed)
ED Provider at bedside. 

## 2017-08-05 NOTE — ED Notes (Signed)
Will go to get blood for pt after collecting ordered blood work. Dinner tray ordered

## 2017-08-05 NOTE — ED Notes (Signed)
Blood bank called, will test previous sample from type and screen

## 2017-08-05 NOTE — ED Notes (Signed)
Admitting paged to verify if blood work ordered to be done should be drawn after transfusion or if blood bank can test previously sent type and screen

## 2017-08-05 NOTE — H&P (Addendum)
History and Physical    GLENDAL CASSADAY ZOX:[id number] DOB: Dec 03, 1960 DOA: 08/05/2017  Referring MD/NP/PA:   PCP: Milus Height, PA-C   Patient coming from:  The patient is coming from home.  At baseline, pt is independent for most of ADL.  Chief Complaint: Generalized weakness  HPI: Luis Ferguson is a 57 y.o. male with medical history significant of psoriasis, lupus, hyperlipidemia, GERD, iron deficiency anemia, aortic stenosis, s/p of aortic valve replacement with bio valve.  Patient states that he underwent aortic valve replacement with pig valve on 07/12/17. In the past several days, he has been feeling fatigued. Had one episode of lightheadedness. His PCP did the blood work on Friday, and found to have low hemoglobin 6.2. He is sent to ED for evaluation and treatment. Patient does not have chest pain, shortness breath, no active bleeding. Denies hematuria, hematochezia or hematemesis. Patient does not have nausea and vomiting, diarrhea, abdominal pain, symptoms of UTI. No unilateral weakness. Patient states that he lost 15 pounds recently. His urine is dark.  ED Course: pt was found to have  hemoglobin 6.7. His hemoglobin was 10.9 on 07/12/17-->9.7 on 07/13/17. WBC 6.0, negative FOBT, electrolytes renal function okay, temperature normal, no tachycardia, oxygen sats are 98% on room air. Patient is placed on telemetry bed for observation.   Review of Systems:   General: no fevers, chills, no body weight gain, has fatigue HEENT: no blurry vision, hearing changes or sore throat Respiratory: no dyspnea, coughing, wheezing CV: no chest pain, no palpitations GI: no nausea, vomiting, abdominal pain, diarrhea, constipation GU: no dysuria, burning on urination, increased urinary frequency, hematuria  Ext: no leg edema Neuro: no unilateral weakness, numbness, or tingling, no vision change or hearing loss Skin: no skin tear. Has psoriatic rashes in legs. MSK: No muscle spasm, no deformity,  no limitation of range of movement in spin Heme: No easy bruising.  Travel history: No recent long distant travel.  Allergy:  Allergies  Allergen Reactions  . Ace Inhibitors Cough  . Codeine Itching and Rash    Past Medical History:  Diagnosis Date  . Aortic stenosis   . Arthritis    achiness in shoulder , elbows  . Connective tissue disease (HCC)    followed by Dr. Kathi Ludwig, GMA, pt. off Prednisone since mid 2017  . Fatigue   . Psoriasis    below the knee on both legs     Past Surgical History:  Procedure Laterality Date  . CARDIAC CATHETERIZATION    . HERNIA REPAIR  2016   left and right  . RIGHT/LEFT HEART CATH AND CORONARY ANGIOGRAPHY N/A 06/16/2017   Procedure: RIGHT/LEFT HEART CATH AND CORONARY ANGIOGRAPHY;  Surgeon: Lyn Records, MD;  Location: [facility];  Service: Cardiovascular;  Laterality: N/A;  . TEE WITHOUT CARDIOVERSION N/A 07/12/2017   Procedure: TRANSESOPHAGEAL ECHOCARDIOGRAM (TEE);  Surgeon: [name], MD;  Location: [facility];  Service: Open Heart Surgery;  Laterality: N/A;    Social History:  reports that  has never smoked. he has never used smokeless tobacco. He reports that he does not drink alcohol or use drugs.  Family History:  Family History  Problem Relation Age of Onset  . Diabetes Mother        also HTN  . CAD Father 3       CABGx3  . Liver cancer Brother   . Diabetes Brother      Prior to Admission medications   Medication Sig Start Date  End Date Taking? Authorizing Provider  aspirin 81 MG tablet Take 81 mg by mouth daily.   Yes [provider]  atorvastatin (LIPITOR) 20 MG tablet Take 20 mg by mouth daily.  11/25/12  Yes [provider]  calcipotriene (DOVONOX) 0.005 % cream Apply 1 application topically 2 (two) times daily as needed.    Yes [provider]  calcium carbonate (TUMS EX) 750 MG chewable tablet Chew 1 tablet by mouth daily as needed for heartburn.   Yes [provider]    cholecalciferol (VITAMIN D) 1000 UNITS tablet Take 1,000 Units by mouth daily.   Yes [provider]  clobetasol ointment (TEMOVATE) 0.05 % Apply 1 application topically 2 (two) times daily as needed.    Yes [provider]  ferrous sulfate 325 (65 FE) MG EC tablet Take 1 tablet (325 mg total) by mouth 2 (two) times daily with a meal. 07/30/17  Yes Kilroy, Eda Paschal, PA-C  hydroxychloroquine (PLAQUENIL) 200 MG tablet Take 400 mg by mouth daily.  03/03/13  Yes [provider]  pantoprazole (PROTONIX) 40 MG tablet Take 1 tablet (40 mg total) by mouth daily. 07/30/17  Yes Kilroy, Luke K, PA-C  scopolamine (TRANSDERM-SCOP) 1 MG/3DAYS Place 1 patch (1.5 mg total) onto the skin every 3 (three) days. Patient not taking: Reported on 08/05/2017 07/21/17   Sharlene Dory, [place]    Physical Exam: Vitals:   08/05/17 2100 08/05/17 2130 08/05/17 2145 08/05/17 2200  BP: 121/64 113/62 117/61 123/74  Pulse:  75 77 77  Resp: (!) 25 (!) 23 19 (!) 22  Temp:      TempSrc:      SpO2:  99% 100% 100%   General: Not in acute distress. Pale looking. HEENT:       Eyes: PERRL, EOMI, no scleral icterus.       ENT: No discharge from the ears and nose, no pharynx injection, no tonsillar enlargement.        Neck: No JVD, no bruit, no mass felt. Heme: No neck lymph node enlargement. Cardiac: S1/S2, RRR, 3/6 systolic murmurs, No gallops or rubs. Respiratory: No rales, wheezing, rhonchi or rubs. GI: Soft, nondistended, nontender, no rebound pain, no organomegaly, BS present. GU: No hematuria Ext: No pitting leg edema bilaterally. 2+DP/PT pulse bilaterally. Musculoskeletal: No joint deformities, No joint redness or warmth, no limitation of ROM in spin. Skin:  Has psoriatic rashes in legs. Neuro: Alert, oriented X3, cranial nerves II-XII grossly intact, moves all extremities normally.  Psych: Patient is not psychotic, no suicidal or hemocidal ideation.  Labs on Admission: I have personally reviewed  following labs and imaging studies  CBC: Recent Labs  Lab 07/30/17 1211 08/05/17 1432 08/05/17 1939  WBC 5.2 6.0 5.3  HGB 6.2* 6.7* 5.6*  HCT 19.3* 23.1* 18.7*  MCV 86 93.1 92.6  PLT 167 154 110*   Basic Metabolic Panel: Recent Labs  Lab 08/05/17 1432  NA 136  K 4.0  CL 106  CO2 22  GLUCOSE 96  BUN 18  CREATININE 0.91  CALCIUM 8.5*   GFR: Estimated Creatinine Clearance: 79.8 mL/min (by C-G formula based on SCr of 0.91 mg/dL). Liver Function Tests: No results for input(s): AST, ALT, ALKPHOS, BILITOT, PROT, ALBUMIN in the last 168 hours. No results for input(s): LIPASE, AMYLASE in the last 168 hours. No results for input(s): AMMONIA in the last 168 hours. Coagulation Profile: Recent Labs  Lab 08/05/17 1939  INR 1.30   Cardiac Enzymes: No results for  input(s): CKTOTAL, CKMB, CKMBINDEX, TROPONINI in the last 168 hours. BNP (last 3 results) No results for input(s): PROBNP in the last 8760 hours. HbA1C: No results for input(s): HGBA1C in the last 72 hours. CBG: No results for input(s): GLUCAP in the last 168 hours. Lipid Profile: No results for input(s): CHOL, HDL, LDLCALC, TRIG, CHOLHDL, LDLDIRECT in the last 72 hours. Thyroid Function Tests: No results for input(s): TSH, T4TOTAL, FREET4, T3FREE, THYROIDAB in the last 72 hours. Anemia Panel: No results for input(s): VITAMINB12, FOLATE, FERRITIN, TIBC, IRON, RETICCTPCT in the last 72 hours. Urine analysis:    Component Value Date/Time   COLORURINE BROWN (A) 08/05/2017 1435   APPEARANCEUR CLEAR 08/05/2017 1435   LABSPEC 1.025 08/05/2017 1435   PHURINE 5.5 08/05/2017 1435   GLUCOSEU NEGATIVE 08/05/2017 1435   HGBUR LARGE (A) 08/05/2017 1435   BILIRUBINUR MODERATE (A) 08/05/2017 1435   KETONESUR NEGATIVE 08/05/2017 1435   PROTEINUR 100 (A) 08/05/2017 1435   NITRITE NEGATIVE 08/05/2017 1435   LEUKOCYTESUR NEGATIVE 08/05/2017 1435   Sepsis Labs: @LABRCNTIP (procalcitonin:4,lacticidven:4) )No results found for  this or any previous visit (from the past 240 hour(s)).   Radiological Exams on Admission: Dg Chest Port 1 View  Result Date: 08/05/2017 CLINICAL DATA:  Anemia and recent aortic valve replacement EXAM: PORTABLE CHEST 1 VIEW COMPARISON:  07/21/2017 FINDINGS: Stable appearance of mini thoracotomy fixation plate to the right of midline and prosthetic aortic valve. Lungs show mild bibasilar atelectasis. There is no evidence of pulmonary edema, consolidation, pneumothorax, nodule or pleural fluid. IMPRESSION: Mild bibasilar atelectasis.  No acute findings. Electronically Signed   By: Irish Lack M.D.   On: 08/05/2017 21:02     EKG: Independently reviewed.  Sinus rhythm, QTC 444, LAE, nonspecific T-wave change.  Assessment/Plan Principal Problem:   Symptomatic anemia Active Problems:   Aortic stenosis   Dyslipidemia   Lupus   S/P AVR   Blood loss anemia   GERD (gastroesophageal reflux disease)   Symptomatic anemia: Patient had hemoglobin 10.9 on 07/12/17 before the surgery, which decreased to 9.7 on12/18/18 after surgery. Current hemoglobin 6.7-->5.6. No active bleeding noted. FOBT negative. Unclear etiology.  -will admit to tele bed for obs -check LDH, haptoglobin and peripheral smear, Coom's test -Anemia panel -transfuse two U of blood now -IVF: 1L NS, then 75 cc/h -Continue iron supplement -Hold aspirin  Addendum: LDH comes back elevated at 1647, indicating possible hemolysis. 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  I consulted hematologist, Dr. Darnelle Catalan. He recommended to start the patient with 10 mg of Decadron 3 times a day. Due to recent aortic valve replacement, Dr. Darnelle Catalan recommended to get 2-D echo to rule out possibility of valve malfunction. -will start Decadron 10 mg 3 times a day -2-D echo -Highly appreciate Dr. Darrall Dears recommendations  Aortic stenosis: s/p of AVR. Surgical site healing well. No chest  pain -CXR  Dyslipidemia: -Lipitor  Lupus: -continue Plaquenil  GERD: -Protonix  DVT ppx: SCD Code Status: Full code Family Communication: None at bed side.      Disposition Plan:  Anticipate discharge back to previous home environment Consults called:  none Admission status: Obs / tele      Date of Service 08/05/2017    Lorretta Harp [facility] Pager [phone]  If 7PM-7AM, please contact night-coverage [email or web address] Password [facility] 08/05/2017, 10:22 PM

## 2017-08-05 NOTE — ED Notes (Signed)
Recliner placed in pt room for pt's wife when she returns.

## 2017-08-05 NOTE — ED Provider Notes (Addendum)
[facility] Provider Note   CSN: [id number] Arrival date & time: 08/05/17  1424     History   Chief Complaint Chief Complaint  Patient presents with  . Abnormal Lab  . Weakness    HPI Luis GAUTHREAUX is a 57 y.o. male.  Chief complaint: anemia.  Status post aortic valve replacement on 07/12/17.  He had lab work on 07/30/17 which revealed a hemoglobin of 6.2.  He was notified today of the results.  He was instructed to come to the ED for transfusion.  No frank black stool.  Aspirin but no other blood thinners.  Past medical history includes aortic stenosis, connective tissue disease, arthritis, psoriasis.  Severity of symptoms is moderate.  Exertion makes symptoms worse.  He feels weak with no energy.      Past Medical History:  Diagnosis Date  . Aortic stenosis   . Arthritis    achiness in shoulder , elbows  . Connective tissue disease (HCC)    followed by Dr. Kathi Ludwig, GMA, pt. off Prednisone since mid 2017  . Fatigue   . Psoriasis    below the knee on both legs     Patient Active Problem List   Diagnosis Date Noted  . Malnutrition of moderate degree 08/07/2017  . Hemolytic anemia (HCC) 08/06/2017  . Thrombocytopenia (HCC) 08/06/2017  . Symptomatic anemia 08/05/2017  . GERD (gastroesophageal reflux disease) 08/05/2017  . Cough 07/30/2017  . S/P AVR 07/12/2017  . Aortic valve regurgitation 01/15/2017  . Aortic stenosis 01/22/2015  . Possible bicuspid aortic valve 01/22/2015  . Dyslipidemia 01/22/2015  . Lupus 01/22/2015    Past Surgical History:  Procedure Laterality Date  . CARDIAC CATHETERIZATION    . HERNIA REPAIR  2016   left and right  . RIGHT/LEFT HEART CATH AND CORONARY ANGIOGRAPHY N/A 06/16/2017   Procedure: RIGHT/LEFT HEART CATH AND CORONARY ANGIOGRAPHY;  Surgeon: Lyn Records, MD;  Location: [facility];  Service: Cardiovascular;  Laterality: N/A;  . TEE WITHOUT CARDIOVERSION N/A 07/12/2017   Procedure: TRANSESOPHAGEAL  ECHOCARDIOGRAM (TEE);  Surgeon: [name], MD;  Location: [facility] OR;  Service: Open Heart Surgery;  Laterality: N/A;       Home Medications    Prior to Admission medications   Medication Sig Start Date End Date Taking? Authorizing Provider  aspirin 81 MG tablet Take 81 mg by mouth daily.   Yes [provider]  atorvastatin (LIPITOR) 20 MG tablet Take 20 mg by mouth daily.  11/25/12  Yes [provider]  calcipotriene (DOVONOX) 0.005 % cream Apply 1 application topically 2 (two) times daily as needed.    Yes [provider]  cholecalciferol (VITAMIN D) 1000 UNITS tablet Take 1,000 Units by mouth daily.   Yes [provider]  clobetasol ointment (TEMOVATE) 0.05 % Apply 1 application topically 2 (two) times daily as needed.    Yes [provider]  hydroxychloroquine (PLAQUENIL) 200 MG tablet Take 400 mg by mouth daily.  03/03/13  Yes [provider]  pantoprazole (PROTONIX) 40 MG tablet Take 1 tablet (40 mg total) by mouth daily. 07/30/17  Yes Kilroy, Eda Paschal, PA-C  folic acid (FOLVITE) 1 MG tablet Take 1 tablet (1 mg total) by mouth daily. 08/07/17   Pearson Grippe, MD  furosemide (LASIX) 20 MG tablet Take 1 tablet (20 mg total) by mouth daily. 09/03/17   [name], MD  metoprolol tartrate (LOPRESSOR) 50 MG tablet Take 1 tablet (50 mg total)  by mouth 2 (two) times daily. 08/17/17   Magrinat, Valentino Hue, MD  potassium chloride (K-DUR,KLOR-CON) 20 MEQ tablet One tablet by  Mouth daily 09/03/17   [name], MD  predniSONE (DELTASONE) 20 MG tablet Take 2.5 tablets (50 mg total) by mouth daily with breakfast. 08/31/17   Magrinat, Valentino Hue, MD  tamsulosin (FLOMAX) 0.4 MG CAPS capsule Take 0.4 mg by mouth.    [provider]    Family History Family History  Problem Relation Age of Onset  . Diabetes Mother        also HTN  . CAD Father 56       CABGx3  . Liver cancer Brother   . Diabetes Brother     Social  History Social History   Tobacco Use  . Smoking status: Never Smoker  . Smokeless tobacco: Never Used  Substance Use Topics  . Alcohol use: No  . Drug use: No     Allergies   Ace inhibitors and Codeine   Review of Systems Review of Systems  All other systems reviewed and are negative.    Physical Exam Updated Vital Signs BP (!) 159/67 (BP Location: Left Arm)   Pulse 74   Temp 98.5 F (36.9 C) (Oral)   Resp 18   Ht 5\' 6"  (1.676 m)   Wt 72.4 kg (159 lb 9.8 oz)   SpO2 100%   BMI 25.76 kg/m   Physical Exam  Constitutional: He is oriented to person, place, and time.  Pale, no acute distress  HENT:  Head: Normocephalic and atraumatic.  Eyes: Conjunctivae are normal.  Neck: Neck supple.  Cardiovascular: Normal rate and regular rhythm.  Pulmonary/Chest: Effort normal and breath sounds normal.  Abdominal: Soft. Bowel sounds are normal.  Genitourinary:  Genitourinary Comments: Rectal exam: No masses; heme-negative  Musculoskeletal: Normal range of motion.  Neurological: He is alert and oriented to person, place, and time.  Skin: Skin is warm and dry.  Psychiatric: He has a normal mood and affect. His behavior is normal.  Nursing note and vitals reviewed.    ED Treatments / Results  Labs (all labs ordered are listed, but only abnormal results are displayed) Labs Reviewed  BASIC METABOLIC PANEL - Abnormal; Notable for the following components:      Result Value   Calcium 8.5 (*)    All other components within normal limits  CBC - Abnormal; Notable for the following components:   RBC 2.48 (*)    Hemoglobin 6.7 (*)    HCT 23.1 (*)    MCHC 29.0 (*)    RDW 26.6 (*)    All other components within normal limits  URINALYSIS, ROUTINE W REFLEX MICROSCOPIC - Abnormal; Notable for the following components:   Color, Urine BROWN (*)    Hgb urine dipstick LARGE (*)    Bilirubin Urine MODERATE (*)    Protein, ur 100 (*)    All other components within normal limits   URINALYSIS, MICROSCOPIC (REFLEX) - Abnormal; Notable for the following components:   Bacteria, UA FEW (*)    All other components within normal limits  LACTATE DEHYDROGENASE - Abnormal; Notable for the following components:   LDH 1,647 (*)    All other components within normal limits  HAPTOGLOBIN - Abnormal; Notable for the following components:   Haptoglobin <10 (*)    All other components within normal limits  IRON AND TIBC - Abnormal; Notable for the following components:   TIBC 190 (*)  All other components within normal limits  FERRITIN - Abnormal; Notable for the following components:   Ferritin 374 (*)    All other components within normal limits  RETICULOCYTES - Abnormal; Notable for the following components:   Retic Ct Pct 7.7 (*)    RBC. 3.09 (*)    Retic Count, Absolute 237.9 (*)    All other components within normal limits  PROTIME-INR - Abnormal; Notable for the following components:   Prothrombin Time 16.1 (*)    All other components within normal limits  CBC - Abnormal; Notable for the following components:   RBC 2.02 (*)    Hemoglobin 5.6 (*)    HCT 18.7 (*)    MCHC 29.9 (*)    RDW 26.5 (*)    Platelets 110 (*)    All other components within normal limits  BRAIN NATRIURETIC PEPTIDE - Abnormal; Notable for the following components:   B Natriuretic Peptide 627.5 (*)    All other components within normal limits  BASIC METABOLIC PANEL - Abnormal; Notable for the following components:   Glucose, Bld 111 (*)    Calcium 8.2 (*)    All other components within normal limits  DIC (DISSEMINATED INTRAVASCULAR COAGULATION) PANEL - Abnormal; Notable for the following components:   Prothrombin Time 15.4 (*)    D-Dimer, Quant 17.59 (*)    Platelets 109 (*)    All other components within normal limits  RETICULOCYTES - Abnormal; Notable for the following components:   Retic Ct Pct 7.9 (*)    RBC. 3.13 (*)    Retic Count, Absolute 247.3 (*)    All other components  within normal limits  CBC - Abnormal; Notable for the following components:   RBC 3.11 (*)    Hemoglobin 8.6 (*)    HCT 28.3 (*)    RDW 21.8 (*)    Platelets 121 (*)    All other components within normal limits  CBC - Abnormal; Notable for the following components:   RBC 3.19 (*)    Hemoglobin 9.3 (*)    HCT 29.1 (*)    RDW 22.3 (*)    Platelets 116 (*)    All other components within normal limits  CBC WITH DIFFERENTIAL/PLATELET - Abnormal; Notable for the following components:   WBC 12.6 (*)    RBC 2.99 (*)    Hemoglobin 8.8 (*)    HCT 27.1 (*)    RDW 23.1 (*)    Neutro Abs 10.1 (*)    Monocytes Absolute 1.1 (*)    All other components within normal limits  PATHOLOGIST SMEAR REVIEW  VITAMIN B12  FOLATE  APTT  HIV ANTIBODY (ROUTINE TESTING)  CBG MONITORING, ED  POC OCCULT BLOOD, ED  TYPE AND SCREEN  PREPARE RBC (CROSSMATCH)  DIRECT ANTIGLOBULIN TEST (NOT AT [facility])    EKG  EKG Interpretation  Date/Time:  Thursday August 05 2017 14:33:49 EST Ventricular Rate:  79 PR Interval:  164 QRS Duration: 98 QT Interval:  388 QTC Calculation: 444 R Axis:   74 Text Interpretation:  Normal sinus rhythm Moderate voltage criteria for LVH, may be normal variant Borderline ECG Confirmed by Derwood Kaplan [phone]), editor Barbette Hair [phone]) on 08/05/2017 3:13:44 PM       Radiology No results found.  Procedures Procedures (including critical care time)  Medications Ordered in ED Medications  0.9 %  sodium chloride infusion ( Intravenous New Bag/Given 08/05/17 1837)  sodium chloride 0.9 % bolus 1,000 mL (0 mLs Intravenous Stopped 08/05/17  2046)  furosemide (LASIX) injection 20 mg (20 mg Intravenous Given 08/05/17 2246)     Initial Impression / Assessment and Plan / ED Course  I have reviewed the triage vital signs and the nursing notes.  Pertinent labs & imaging results that were available during my care of the patient were reviewed by me and considered in my medical  decision making (see chart for details).     Patient is status post aortic valve replacement on 07/12/17.  He presents today with a low hemoglobin (6.7).  Will transfuse and admit to general medicine.   CRITICAL CARE Performed by: Donnetta Hutching  ?  Total critical care time: 30 minutes  Critical care time was exclusive of separately billable procedures and treating other patients.  Critical care was necessary to treat or prevent imminent or life-threatening deterioration.  Critical care was time spent personally by me on the following activities: development of treatment plan with patient and/or surrogate as well as nursing, discussions with consultants, evaluation of patient's response to treatment, examination of patient, obtaining history from patient or surrogate, ordering and performing treatments and interventions, ordering and review of laboratory studies, ordering and review of radiographic studies, pulse oximetry and re-evaluation of patient's condition. Final Clinical Impressions(s) / ED Diagnoses   Final diagnoses:  Symptomatic anemia  Anemia    ED Discharge Orders        Ordered    folic acid (FOLVITE) 1 MG tablet  Daily     08/07/17 0740    predniSONE (DELTASONE) 20 MG tablet  Daily,   Status:  Discontinued     08/07/17 0740       Donnetta Hutching, MD 08/05/17 Acie Fredrickson, MD 09/06/17 1342    Donnetta Hutching, MD 09/06/17 1348

## 2017-08-05 NOTE — ED Triage Notes (Signed)
Pt arrives via POV from home, told to come to ED for blood transfusion. HGb noted to be 6.2. Recent aortic valve replacement Dec 17th. Pt c/o weakness, fatigue. Appears pale. Alert, oriented x4.

## 2017-08-05 NOTE — ED Notes (Signed)
This RN notified blood is ready.

## 2017-08-05 NOTE — ED Notes (Signed)
Pt can eat per Dr Adriana Simas

## 2017-08-06 ENCOUNTER — Other Ambulatory Visit: Payer: Self-pay

## 2017-08-06 ENCOUNTER — Other Ambulatory Visit: Payer: Self-pay | Admitting: Oncology

## 2017-08-06 ENCOUNTER — Inpatient Hospital Stay (HOSPITAL_COMMUNITY): Payer: Commercial Managed Care - PPO

## 2017-08-06 DIAGNOSIS — D598 Other acquired hemolytic anemias: Secondary | ICD-10-CM | POA: Diagnosis present

## 2017-08-06 DIAGNOSIS — M329 Systemic lupus erythematosus, unspecified: Secondary | ICD-10-CM | POA: Diagnosis present

## 2017-08-06 DIAGNOSIS — R0600 Dyspnea, unspecified: Secondary | ICD-10-CM | POA: Diagnosis not present

## 2017-08-06 DIAGNOSIS — D594 Other nonautoimmune hemolytic anemias: Secondary | ICD-10-CM

## 2017-08-06 DIAGNOSIS — L930 Discoid lupus erythematosus: Secondary | ICD-10-CM | POA: Diagnosis not present

## 2017-08-06 DIAGNOSIS — Z79899 Other long term (current) drug therapy: Secondary | ICD-10-CM | POA: Diagnosis not present

## 2017-08-06 DIAGNOSIS — Z952 Presence of prosthetic heart valve: Secondary | ICD-10-CM | POA: Diagnosis not present

## 2017-08-06 DIAGNOSIS — Z6825 Body mass index (BMI) 25.0-25.9, adult: Secondary | ICD-10-CM | POA: Diagnosis not present

## 2017-08-06 DIAGNOSIS — E785 Hyperlipidemia, unspecified: Secondary | ICD-10-CM | POA: Diagnosis present

## 2017-08-06 DIAGNOSIS — D589 Hereditary hemolytic anemia, unspecified: Secondary | ICD-10-CM | POA: Diagnosis present

## 2017-08-06 DIAGNOSIS — D696 Thrombocytopenia, unspecified: Secondary | ICD-10-CM | POA: Diagnosis present

## 2017-08-06 DIAGNOSIS — D649 Anemia, unspecified: Secondary | ICD-10-CM | POA: Diagnosis present

## 2017-08-06 DIAGNOSIS — D590 Drug-induced autoimmune hemolytic anemia: Secondary | ICD-10-CM | POA: Diagnosis not present

## 2017-08-06 DIAGNOSIS — K219 Gastro-esophageal reflux disease without esophagitis: Secondary | ICD-10-CM | POA: Diagnosis present

## 2017-08-06 DIAGNOSIS — L409 Psoriasis, unspecified: Secondary | ICD-10-CM | POA: Diagnosis present

## 2017-08-06 DIAGNOSIS — R740 Nonspecific elevation of levels of transaminase and lactic acid dehydrogenase [LDH]: Secondary | ICD-10-CM | POA: Diagnosis present

## 2017-08-06 DIAGNOSIS — Z7982 Long term (current) use of aspirin: Secondary | ICD-10-CM | POA: Diagnosis not present

## 2017-08-06 DIAGNOSIS — Z953 Presence of xenogenic heart valve: Secondary | ICD-10-CM | POA: Diagnosis not present

## 2017-08-06 DIAGNOSIS — E440 Moderate protein-calorie malnutrition: Secondary | ICD-10-CM | POA: Diagnosis present

## 2017-08-06 LAB — TYPE AND SCREEN
ABO/RH(D): O POS
ANTIBODY SCREEN: NEGATIVE
UNIT DIVISION: 0
Unit division: 0

## 2017-08-06 LAB — RETICULOCYTES
RBC.: 3.09 MIL/uL — ABNORMAL LOW (ref 4.22–5.81)
RBC.: 3.13 MIL/uL — ABNORMAL LOW (ref 4.22–5.81)
RETIC COUNT ABSOLUTE: 237.9 10*3/uL — AB (ref 19.0–186.0)
Retic Count, Absolute: 247.3 10*3/uL — ABNORMAL HIGH (ref 19.0–186.0)
Retic Ct Pct: 7.7 % — ABNORMAL HIGH (ref 0.4–3.1)
Retic Ct Pct: 7.9 % — ABNORMAL HIGH (ref 0.4–3.1)

## 2017-08-06 LAB — BASIC METABOLIC PANEL
Anion gap: 8 (ref 5–15)
BUN: 18 mg/dL (ref 6–20)
CALCIUM: 8.2 mg/dL — AB (ref 8.9–10.3)
CO2: 22 mmol/L (ref 22–32)
CREATININE: 1.05 mg/dL (ref 0.61–1.24)
Chloride: 105 mmol/L (ref 101–111)
GFR calc Af Amer: >60 mL/min (ref >60)
Glucose, Bld: 111 mg/dL — ABNORMAL HIGH (ref 65–99)
POTASSIUM: 3.5 mmol/L (ref 3.5–5.1)
SODIUM: 135 mmol/L (ref 135–145)

## 2017-08-06 LAB — CBC
HCT: 28.3 % — ABNORMAL LOW (ref 39.0–52.0)
HEMATOCRIT: 29.1 % — AB (ref 39.0–52.0)
Hemoglobin: 8.6 g/dL — ABNORMAL LOW (ref 13.0–17.0)
Hemoglobin: 9.3 g/dL — ABNORMAL LOW (ref 13.0–17.0)
MCH: 27.7 pg (ref 26.0–34.0)
MCH: 29.2 pg (ref 26.0–34.0)
MCHC: 30.4 g/dL (ref 30.0–36.0)
MCHC: 32 g/dL (ref 30.0–36.0)
MCV: 91 fL (ref 78.0–100.0)
MCV: 91.2 fL (ref 78.0–100.0)
PLATELETS: 121 10*3/uL — AB (ref 150–400)
PLATELETS: 116 10*3/uL — AB (ref 150–400)
RBC: 3.11 MIL/uL — AB (ref 4.22–5.81)
RBC: 3.19 MIL/uL — ABNORMAL LOW (ref 4.22–5.81)
RDW: 21.8 % — AB (ref 11.5–15.5)
RDW: 22.3 % — AB (ref 11.5–15.5)
WBC: 6.2 10*3/uL (ref 4.0–10.5)
WBC: 5.3 10*3/uL (ref 4.0–10.5)

## 2017-08-06 LAB — ECHOCARDIOGRAM COMPLETE
AOASC: 33 cm
CHL CUP MV DEC (S): 183
CHL CUP REG VEL DIAS: 121 cm/s
CHL CUP TV REG PEAK VELOCITY: 258 cm/s
E decel time: 183 msec
FS: 25 % — AB (ref 28–44)
HEIGHTINCHES: 66 in
IVS/LV PW RATIO, ED: 1.04
LA diam end sys: 46 mm
LADIAMINDEX: 2.49 cm/m2
LASIZE: 46 mm
LAVOL: 70 mL
LAVOLA4C: 58.4 mL
LAVOLIN: 37.9 mL/m2
LDCA: 2.84 cm2
LVOT diameter: 19 mm
MV Peak grad: 6 mmHg
MV pk A vel: 54.3 m/s
MVPKEVEL: 121 m/s
P 1/2 time: 299 ms
PV Reg grad dias: 6 mmHg
PW: 13.2 mm — AB (ref 0.6–1.1)
TAPSE: 23.3 mm
TRMAXVEL: 258 cm/s
WEIGHTICAEL: 2550.28 [oz_av]

## 2017-08-06 LAB — BPAM RBC
Blood Product Expiration Date: 201902042359
Blood Product Expiration Date: 201902042359
ISSUE DATE / TIME: 201901101952
ISSUE DATE / TIME: 201901102224
UNIT TYPE AND RH: 5100
Unit Type and Rh: 5100

## 2017-08-06 LAB — DIC (DISSEMINATED INTRAVASCULAR COAGULATION) PANEL
D DIMER QUANT: 17.59 ug{FEU}/mL — AB (ref 0.00–0.50)
FIBRINOGEN: 326 mg/dL (ref 210–475)
INR: 1.23
PROTHROMBIN TIME: 15.4 s — AB (ref 11.4–15.2)

## 2017-08-06 LAB — DIC (DISSEMINATED INTRAVASCULAR COAGULATION)PANEL
Platelets: 109 10*3/uL — ABNORMAL LOW (ref 150–400)
aPTT: 28 seconds (ref 24–36)

## 2017-08-06 LAB — IRON AND TIBC
IRON: 53 ug/dL (ref 45–182)
Saturation Ratios: 28 % (ref 17.9–39.5)
TIBC: 190 ug/dL — ABNORMAL LOW (ref 250–450)
UIBC: 137 ug/dL

## 2017-08-06 LAB — FOLATE: FOLATE: 16 ng/mL (ref >5.9)

## 2017-08-06 LAB — VITAMIN B12: Vitamin B-12: 351 pg/mL (ref 180–914)

## 2017-08-06 LAB — FERRITIN: Ferritin: 374 ng/mL — ABNORMAL HIGH (ref 24–336)

## 2017-08-06 MED ORDER — PREDNISONE 50 MG PO TABS
60.0000 mg | ORAL_TABLET | Freq: Every day | ORAL | Status: DC
  Administered 2017-08-06 – 2017-08-07 (×2): 60 mg via ORAL
  Filled 2017-08-06 (×2): qty 1

## 2017-08-06 MED ORDER — FOLIC ACID 1 MG PO TABS
1.0000 mg | ORAL_TABLET | Freq: Every day | ORAL | Status: DC
  Administered 2017-08-06 – 2017-08-07 (×2): 1 mg via ORAL
  Filled 2017-08-06 (×2): qty 1

## 2017-08-06 NOTE — Care Management Note (Signed)
Case Management Note  Patient Details  Name: Luis Ferguson MRN: [id number] Date of Birth: 29-Apr-1961  Subjective/Objective:                 Independent from home, admitted with symptomatic anemia. Anticipate return to home at DC. No CM needs identified at this time.    Action/Plan:   Expected Discharge Date:                  Expected Discharge Plan:  Home/Self Care  In-House Referral:     Discharge planning Services  CM Consult  Post Acute Care Choice:    Choice offered to:     DME Arranged:    DME Agency:     HH Arranged:    HH Agency:     Status of Service:  In process, will continue to follow  If discussed at Long Length of Stay Meetings, dates discussed:    Additional Comments:  Lawerance Sabal, RN 08/06/2017, 2:47 PM

## 2017-08-06 NOTE — Progress Notes (Signed)
  PROGRESS NOTE  Luis Ferguson CWC:[id number] DOB: Dec 17, 1960 DOA: 08/05/2017 PCP: Milus Height, PA-C  Brief Narrative: 57yom PMH psoriasis, lupus, aortic stenosis s/p AVR 06/2017 presented to ED after outpt bloodwork revealed Hgb 6.2; symptoms included lightheadedness.   Assessment/Plan Symptomatic hemolytic anemia. FOBT negative. Anemia panel unremarkable. LDH elevated. Schistocytes present. Retic high. Ddimer very high.  - Hgb 5.6 >> 9.3 s/p 2 units PRBC - hematology rec Decadron, echo to r/o valve malfunction - further recs per hematology - CBC with diff, LDH in AM  Thrombocytopenia - etiology unclear, seen 06/2017 but plts WNL 1/4 and 08/05/2017.  Connective tissue disease, followed by Dr. Kathi Ludwig, off prednisone since 2017 - Plaquenil  S/p bioprosthetic AVR 12/17.  - echo pending  DVT prophylaxis: SCDs Code Status: full Family Communication: at bedside Disposition Plan: home    [name], MD  [facility] Direct contact: [phone] --Via amion app OR  --[email or web address]; password [email or web address]  7PM-7AM contact night coverage as above 08/06/2017, 2:15 PM  LOS: 0 days   Consultants:  Hematology   Procedures:    Antimicrobials:    Interval history/Subjective: Feels a lot better today.  Objective: Vitals:  Vitals:   08/06/17 0527 08/06/17 0916  BP: (!) 159/67 (!) 152/64  Pulse: 71 76  Resp: 18 18  Temp: 98.6 F (37 C) 98.5 F (36.9 C)  SpO2: 100% 100%    Exam:  Constitutional:  . Appears calm and comfortable Respiratory:  . CTA bilaterally, no w/r/r.  . Respiratory effort normal.  Cardiovascular:  . RRR, no m/r/g . No LE extremity edema   Skin: pale Psychiatric:  . Mental status o Mood, affect appropriate  I have personally reviewed the following:   Labs:  Hgb 5.6 >> 8.6 >> 9.3  Schistocytes present  Retic 7.7  Plts 11-, 121, 109, 116  Imaging studies:  CXR NAD  Medical tests:  EKG independent review SR no acute  changes   Scheduled Meds: . atorvastatin  20 mg Oral Daily  . cholecalciferol  1,000 Units Oral Daily  . ferrous sulfate  325 mg Oral BID WC  . folic acid  1 mg Oral Daily  . hydroxychloroquine  400 mg Oral Daily  . pantoprazole  40 mg Oral Daily  . predniSONE  60 mg Oral Daily   Continuous Infusions:  Principal Problem:   Hemolytic anemia (HCC) Active Problems:   Lupus   S/P AVR   Symptomatic anemia   Thrombocytopenia (HCC)   LOS: 0 days    Time >35 minutes, >50% in counseling discussion of diagnoses and treatment plan with patient and discussion of case with Dr. Myna Hidalgo

## 2017-08-06 NOTE — Progress Notes (Signed)
Initial Nutrition Assessment  DOCUMENTATION CODES:   Non-severe (moderate) malnutrition in context of chronic illness  INTERVENTION:   [employer] Instant Breakfast BID, each supplement provides 220 kcal and 13 grams protein  NUTRITION DIAGNOSIS:   Moderate Malnutrition related to chronic illness(aortic stenosis) as evidenced by moderate fat depletion, severe muscle depletion, moderate muscle depletion.  GOAL:   Patient will meet greater than or equal to 90% of their needs  MONITOR:   PO intake, Supplement acceptance, Weight trends, I & O's, Labs  REASON FOR ASSESSMENT:   Malnutrition Screening Tool    ASSESSMENT:   Pt with PMH of aortic stenosis s/p AVR (07/12/2017), psoriasis, lupus, GERD, and HLD presents with symptomatic anemia   Pt s/p 2 units pRBC. Pt reports an increasing appetite. Reporting PTA a poor appetite since his surgery. Pt consumes 3 small meals daily typically cheerios, Stouffers meal, and left overs. Snacks throughout the day may include nuts or fruit. Pt's wife provides [employer] for pt once or twice daily. Per chart and pt, pt consumed 100% of breakfast this morning.   Pt endorses weight loss. Pt reports a UBW of 165 lbs, stating he weighed this a couple weeks ago.   Pt reports ongoing poor energy and that his clothes have been "baggy" Pt at risk for severe malnutrition if intake continues to be poor  Labs reviewed; Hemoglobin 9.3 (up from 5.6 on 01/10),  Medications reviewed; vitamin D, ferrous sulfate, folic acid, Protonix, Prednisone, Plaquenil  NUTRITION - FOCUSED PHYSICAL EXAM:    Most Recent Value  Orbital Region  Severe depletion  Upper Arm Region  Moderate depletion  Thoracic and Lumbar Region  Mild depletion  Buccal Region  Mild depletion  Temple Region  Severe depletion  Clavicle Bone Region  Severe depletion  Clavicle and Acromion Bone Region  Severe depletion  Scapular Bone Region  Unable to assess  Dorsal Hand   Moderate depletion  Patellar Region  Moderate depletion  Anterior Thigh Region  Severe depletion  Posterior Calf Region  Moderate depletion  Edema (RD Assessment)  None      Diet Order:  Diet Heart Room service appropriate? Yes; Fluid consistency: Thin  EDUCATION NEEDS:   Not appropriate for education at this time  Skin:  Skin Assessment: Skin Integrity Issues: Skin Integrity Issues:: Incisions Incisions: chest and groin  Last BM:  Unknown BM Date  Height:   Ht Readings from Last 1 Encounters:  08/06/17 5\' 6"  (1.676 m)   Weight:   Wt Readings from Last 1 Encounters:  08/06/17 159 lb 6.3 oz (72.3 kg)   Ideal Body Weight:  64.5 kg  BMI:  Body mass index is 25.73 kg/m.  Estimated Nutritional Needs:   Kcal:  1800-2000  Protein:  90-100 grams  Fluid:  >/= 1.8 L/d  Fransisca Kaufmann, MS, RDN, LDN 08/06/2017 4:30 PM

## 2017-08-06 NOTE — Progress Notes (Signed)
Heme note:  Luis Ferguson has a severe Coombs negative hemolytic anemia. I doubt this is going to be due to valve malfunction but given his recent surgery I am alerting his cardiologist and TSU re his admission  I discussed pathophysiology and initial data with him and his wife.Will start prednisone now and d/c decadron. He does no need iron, does need folate. Needs to continue protonix or similar so long as he is on steroids  Full note to follow

## 2017-08-06 NOTE — Consult Note (Signed)
[facility]  [phone] [phone]) [phone]     ID: Luis Ferguson DOB: 1960/08/23  MR#: [id number]  BCW#:[id number]  Patient Care Team: Lennie Odor, PA-C as PCP - General (Nurse Practitioner) Pixie Casino, MD as PCP - Cardiology (Cardiology) Bobetta Lime, MD OTHER MD:  CHIEF COMPLAINT: Hemolytic anemia  CURRENT TREATMENT: Steroids   HISTORY OF CURRENT ILLNESS: Luis Ferguson has a history of lupus which is managed I believe by Dr. Estanislado Pandy (the patient was not certain of the name today).  He has been on Plaquenil and tells me his lupus has been very stable.  On 07/12/2017 he underwent valve replacement for severe aortic stenosis.  This was an E i.e. valve, which is a tissue valve.  He did generally well with the surgery and note that his hemoglobin preop was 12.5 (on 07/09/2017).  At the time of discharge from that procedure his hemoglobin remained low and on 07/19/2017 he was transfused 2 units of packed red cells.  He then followed up with cardiology on 07/30/2017 and at that time his hemoglobin was down to 6.2.  White cell count was 5.2 and platelets 267,000.  The MCV was essentially unchanged at 86.  The patient was eventually alerted to these results and asked to present for admission.  This happened yesterday, 08/05/2017, and admission workup included, in addition to the anemia findings, and LDH of 1647, a reticulocyte count of greater than 200, and normal creatinine, INR, and a PTT.  The DIC panel did show schistocytes.  DAT was negative for IgG and complement.  Haptoglobin is pending  We were consulted for further evaluation and treatment  INTERVAL HISTORY: I met with Luis Ferguson and his wife Luis Ferguson in his hospital room on 08/06/2017 to discuss his situation.   REVIEW OF SYSTEMS: He tolerated his severe anemia generally well.  He did have some shortness of breath, looked pale, and felt a little faint E, but there were no falls, no loss of consciousness, no  chest pressure.  He denies any intercurrent fever.  Has been no rash.  He tells me his lupus has been very well controlled and there have been no recent medication changes there.  A detailed review of systems today was otherwise noncontributory  PAST MEDICAL HISTORY: Past Medical History:  Diagnosis Date  . Aortic stenosis   . Arthritis    achiness in shoulder , elbows  . Connective tissue disease ([place])    followed by Dr. Dossie Der, [facility], pt. off Prednisone since mid 2017  . Fatigue   . Psoriasis    below the knee on both legs     PAST SURGICAL HISTORY: Past Surgical History:  Procedure Laterality Date  . CARDIAC CATHETERIZATION    . HERNIA REPAIR  2016   left and right  . RIGHT/LEFT HEART CATH AND CORONARY ANGIOGRAPHY N/A 06/16/2017   Procedure: RIGHT/LEFT HEART CATH AND CORONARY ANGIOGRAPHY;  Surgeon: Belva Crome, MD;  Location: [facility];  Service: Cardiovascular;  Laterality: N/A;  . TEE WITHOUT CARDIOVERSION N/A 07/12/2017   Procedure: TRANSESOPHAGEAL ECHOCARDIOGRAM (TEE);  Surgeon: Melrose Nakayama, MD;  Location: [facility];  Service: Open Heart Surgery;  Laterality: N/A;    FAMILY HISTORY Family History  Problem Relation Age of Onset  . Diabetes Mother        also HTN  . CAD Father 75       CABGx3  . Liver cancer Brother   . Diabetes Brother     SOCIAL  HISTORY:  He works for Product/process development scientist, cutting metal.  He denies any significant exposure at work.  His wife Luis Ferguson works as an Optometrist.  They have no children.  They do have 2 dogs.  They belong to a local [employer] church.    ADVANCED DIRECTIVES:    HEALTH MAINTENANCE: Social History   Tobacco Use  . Smoking status: Never Smoker  . Smokeless tobacco: Never Used  Substance Use Topics  . Alcohol use: No  . Drug use: No     Colonoscopy:  PAP:  Bone density:   Allergies  Allergen Reactions  . Ace Inhibitors Cough  . Codeine Itching and Rash    Current Facility-Administered  Medications  Medication Dose Route Frequency Provider Last Rate Last Dose  . acetaminophen (TYLENOL) tablet 650 mg  650 mg Oral Q6H PRN Ivor Costa, MD   650 mg at 08/06/17 1508   Or  . acetaminophen (TYLENOL) suppository 650 mg  650 mg Rectal Q6H PRN Ivor Costa, MD      . atorvastatin (LIPITOR) tablet 20 mg  20 mg Oral Daily Ivor Costa, MD   20 mg at 08/06/17 1009  . calcipotriene (DOVONOX) 3.903 % cream 1 application  1 application Topical BID PRN Ivor Costa, MD      . calcium carbonate (TUMS - dosed in mg elemental calcium) chewable tablet 500 mg  500 mg Oral Daily PRN Ivor Costa, MD      . cholecalciferol (VITAMIN D) tablet 1,000 Units  1,000 Units Oral Daily Ivor Costa, MD   1,000 Units at 08/06/17 1009  . clobetasol ointment (TEMOVATE) 0.09 % 1 application  1 application Topical BID PRN Ivor Costa, MD      . ferrous sulfate tablet 325 mg  325 mg Oral BID WC Ivor Costa, MD   325 mg at 08/06/17 1618  . folic acid (FOLVITE) tablet 1 mg  1 mg Oral Daily Magrinat, Virgie Dad, MD   1 mg at 08/06/17 1204  . hydrALAZINE (APRESOLINE) injection 5 mg  5 mg Intravenous Q2H PRN Ivor Costa, MD      . hydroxychloroquine (PLAQUENIL) tablet 400 mg  400 mg Oral Daily Ivor Costa, MD   400 mg at 08/06/17 1009  . ondansetron (ZOFRAN) tablet 4 mg  4 mg Oral Q6H PRN Ivor Costa, MD       Or  . ondansetron [facility]) injection 4 mg  4 mg Intravenous Q6H PRN Ivor Costa, MD      . pantoprazole (PROTONIX) EC tablet 40 mg  40 mg Oral Daily Ivor Costa, MD   40 mg at 08/06/17 1009  . polyethylene glycol (MIRALAX / GLYCOLAX) packet 17 g  17 g Oral Daily PRN Ivor Costa, MD      . predniSONE (DELTASONE) tablet 60 mg  60 mg Oral Daily Magrinat, Virgie Dad, MD   60 mg at 08/06/17 1204  . zolpidem (AMBIEN) tablet 5 mg  5 mg Oral QHS PRN Ivor Costa, MD        OBJECTIVE: Middle-aged white man examined in bed  Vitals:   08/06/17 0527 08/06/17 0916  BP: (!) 159/67 (!) 152/64  Pulse: 71 76  Resp: 18 18  Temp: 98.6 F (37 C)  98.5 F (36.9 C)  SpO2: 100% 100%     Body mass index is 25.73 kg/m.   Wt Readings from Last 3 Encounters:  08/06/17 159 lb 6.3 oz (72.3 kg)  07/30/17 150 lb (68 kg)  07/17/17 171 lb 8.3  oz (77.8 kg)      ECOG FS:2 - Symptomatic, <50% confined to bed   Lymphatic: No cervical or supraclavicular adenopathy Lungs no rales or rhonchi Heart regular rate and rhythm Abd soft, nontender, positive bowel sounds, do not palpate a spleen tip MSK no focal spinal tenderness, no joint edema Neuro: non-focal, well-oriented, appropriate affect  LAB RESULTS:  CMP     Component Value Date/Time   NA 135 08/06/2017 0437   NA 142 06/15/2017 1603   K 3.5 08/06/2017 0437   CL 105 08/06/2017 0437   CO2 22 08/06/2017 0437   GLUCOSE 111 (H) 08/06/2017 0437   BUN 18 08/06/2017 0437   BUN 12 06/15/2017 1603   CREATININE 1.05 08/06/2017 0437   CALCIUM 8.2 (L) 08/06/2017 0437   PROT 7.0 07/09/2017 1312   ALBUMIN 3.9 07/09/2017 1312   AST 36 07/09/2017 1312   ALT 50 07/09/2017 1312   ALKPHOS 57 07/09/2017 1312   BILITOT 0.5 07/09/2017 1312   GFRNONAA >60 08/06/2017 0437   GFRAA >60 08/06/2017 0437    No results found for: TOTALPROTELP, ALBUMINELP, A1GS, A2GS, BETS, BETA2SER, GAMS, MSPIKE, SPEI  No results found for: KPAFRELGTCHN, LAMBDASER, KAPLAMBRATIO  Lab Results  Component Value Date   WBC 5.3 08/06/2017   HGB 9.3 (L) 08/06/2017   HCT 29.1 (L) 08/06/2017   MCV 91.2 08/06/2017   PLT 116 (L) 08/06/2017    @LASTCHEMISTRY @  No results found for: LABCA2  No components found for: RCVELF810  Recent Labs  Lab 08/06/17 0436  INR 1.23    No results found for: LABCA2  No results found for: FBP102  No results found for: HEN277  No results found for: OEU235  No results found for: CA2729  No components found for: HGQUANT  No results found for: CEA1 / No results found for: CEA1   No results found for: AFPTUMOR  No results found for: CHROMOGRNA  No results found for:  PSA1  Admission on 08/05/2017  Component Date Value Ref Range Status  . Sodium 08/05/2017 136  135 - 145 mmol/L Final  . Potassium 08/05/2017 4.0  3.5 - 5.1 mmol/L Final  . Chloride 08/05/2017 106  101 - 111 mmol/L Final  . CO2 08/05/2017 22  22 - 32 mmol/L Final  . Glucose, Bld 08/05/2017 96  65 - 99 mg/dL Final  . BUN 08/05/2017 18  6 - 20 mg/dL Final  . Creatinine, Ser 08/05/2017 0.91  0.61 - 1.24 mg/dL Final  . Calcium 08/05/2017 8.5* 8.9 - 10.3 mg/dL Final  . GFR calc non Af Amer 08/05/2017 >60  >60 mL/min Final  . GFR calc Af Amer 08/05/2017 >60  >60 mL/min Final   Comment: (NOTE) The eGFR has been calculated using the CKD EPI equation. This calculation has not been validated in all clinical situations. eGFR's persistently <60 mL/min signify possible Chronic Kidney Disease.   . Anion gap 08/05/2017 8  5 - 15 Final  . WBC 08/05/2017 6.0  4.0 - 10.5 K/uL Final  . RBC 08/05/2017 2.48* 4.22 - 5.81 MIL/uL Final  . Hemoglobin 08/05/2017 6.7* 13.0 - 17.0 g/dL Final   Comment: REPEATED TO VERIFY SPECIMEN CHECKED FOR CLOTS CRITICAL RESULT CALLED TO, READ BACK BY AND VERIFIED WITH: W PULSE,RN 1530 08/05/2017 WBOND   . HCT 08/05/2017 23.1* 39.0 - 52.0 % Final  . MCV 08/05/2017 93.1  78.0 - 100.0 fL Final  . MCH 08/05/2017 27.0  26.0 - 34.0 pg Final  . MCHC 08/05/2017 29.0*  30.0 - 36.0 g/dL Final  . RDW 08/05/2017 26.6* 11.5 - 15.5 % Final  . Platelets 08/05/2017 154  150 - 400 K/uL Final   PLATELET COUNT CONFIRMED BY SMEAR  . Color, Urine 08/05/2017 BROWN* YELLOW Final   BIOCHEMICALS MAY BE AFFECTED BY COLOR  . APPearance 08/05/2017 CLEAR  CLEAR Final  . Specific Gravity, Urine 08/05/2017 1.025  1.005 - 1.030 Final  . pH 08/05/2017 5.5  5.0 - 8.0 Final  . Glucose, UA 08/05/2017 NEGATIVE  NEGATIVE mg/dL Final  . Hgb urine dipstick 08/05/2017 LARGE* NEGATIVE Final  . Bilirubin Urine 08/05/2017 MODERATE* NEGATIVE Final  . Ketones, ur 08/05/2017 NEGATIVE  NEGATIVE mg/dL Final  .  Protein, ur 08/05/2017 100* NEGATIVE mg/dL Final  . Nitrite 08/05/2017 NEGATIVE  NEGATIVE Final  . Leukocytes, UA 08/05/2017 NEGATIVE  NEGATIVE Final  . ABO/RH(D) 08/05/2017 O POS   Final  . Antibody Screen 08/05/2017 NEG   Final  . Sample Expiration 08/05/2017 08/08/2017   Final  . Unit Number 08/05/2017 [id number]   Final  . Blood Component Type 08/05/2017 RED CELLS,LR   Final  . Unit division 08/05/2017 00   Final  . Status of Unit 08/05/2017 ISSUED,FINAL   Final  . Transfusion Status 08/05/2017 OK TO TRANSFUSE   Final  . Crossmatch Result 08/05/2017 Compatible   Final  . Unit Number 08/05/2017 [id number]   Final  . Blood Component Type 08/05/2017 RED CELLS,LR   Final  . Unit division 08/05/2017 00   Final  . Status of Unit 08/05/2017 ISSUED,FINAL   Final  . Transfusion Status 08/05/2017 OK TO TRANSFUSE   Final  . Crossmatch Result 08/05/2017 Compatible   Final  . RBC / HPF 08/05/2017 0-5  0 - 5 RBC/hpf Final  . WBC, UA 08/05/2017 0-5  0 - 5 WBC/hpf Final  . Bacteria, UA 08/05/2017 FEW* NONE SEEN Final  . Squamous Epithelial / LPF 08/05/2017 NONE SEEN  NONE SEEN Final  . Granular Casts, UA 08/05/2017 PRESENT   Final  . Fecal Occult Bld 08/05/2017 NEGATIVE  NEGATIVE Final  . Order Confirmation 08/05/2017 ORDER PROCESSED BY BLOOD BANK   Final  . ISSUE DATE / TIME 08/05/2017 [id number]   Final  . Blood Product Unit Number 08/05/2017 [id number]   Final  . PRODUCT CODE 08/05/2017 [id number]   Final  . Unit Type and Rh 08/05/2017 5100   Final  . Blood Product Expiration Date 08/05/2017 [id number]   Final  . ISSUE DATE / TIME 08/05/2017 [id number]   Final  . Blood Product Unit Number 08/05/2017 [id number]   Final  . PRODUCT CODE 08/05/2017 [id number]   Final  . Unit Type and Rh 08/05/2017 5100   Final  . Blood Product Expiration Date 08/05/2017 [id number]   Final  . LDH 08/05/2017 1,647* 98 - 192 U/L Final  . Vitamin B-12 08/06/2017 351  180 - 914 pg/mL Final    Comment: (NOTE) This assay is not validated for testing neonatal or myeloproliferative syndrome specimens for Vitamin B12 levels.   . Folate 08/06/2017 16.0  >5.9 ng/mL Final  . Iron 08/06/2017 53  45 - 182 ug/dL Final  . TIBC 08/06/2017 190* 250 - 450 ug/dL Final  . Saturation Ratios 08/06/2017 28  17.9 - 39.5 % Final  . UIBC 08/06/2017 137  ug/dL Final  . Ferritin 08/06/2017 374* 24 - 336 ng/mL Final  . Retic Ct Pct 08/06/2017 7.7* 0.4 - 3.1 % Final  . RBC. 08/06/2017 3.09* 4.22 - 5.81 MIL/uL Final  .  Retic Count, Absolute 08/06/2017 237.9* 19.0 - 186.0 K/uL Final  . Prothrombin Time 08/05/2017 16.1* 11.4 - 15.2 seconds Final  . INR 08/05/2017 1.30   Final  . aPTT 08/05/2017 29  24 - 36 seconds Final  . WBC 08/05/2017 5.3  4.0 - 10.5 K/uL Final  . RBC 08/05/2017 2.02* 4.22 - 5.81 MIL/uL Final  . Hemoglobin 08/05/2017 5.6* 13.0 - 17.0 g/dL Final   Comment: REPEATED TO VERIFY SPECIMEN CHECKED FOR CLOTS CRITICAL VALUE NOTED.  VALUE IS CONSISTENT WITH PREVIOUSLY REPORTED AND CALLED VALUE.   [name] HCT 08/05/2017 18.7* 39.0 - 52.0 % Final  . MCV 08/05/2017 92.6  78.0 - 100.0 fL Final  . MCH 08/05/2017 27.7  26.0 - 34.0 pg Final  . MCHC 08/05/2017 29.9* 30.0 - 36.0 g/dL Final  . RDW 08/05/2017 26.5* 11.5 - 15.5 % Final  . Platelets 08/05/2017 110* 150 - 400 K/uL Final   Comment: REPEATED TO VERIFY SPECIMEN CHECKED FOR CLOTS PLATELET COUNT CONFIRMED BY SMEAR   . B Natriuretic Peptide 08/05/2017 627.5* 0.0 - 100.0 pg/mL Final  . Sodium 08/06/2017 135  135 - 145 mmol/L Final  . Potassium 08/06/2017 3.5  3.5 - 5.1 mmol/L Final  . Chloride 08/06/2017 105  101 - 111 mmol/L Final  . CO2 08/06/2017 22  22 - 32 mmol/L Final  . Glucose, Bld 08/06/2017 111* 65 - 99 mg/dL Final  . BUN 08/06/2017 18  6 - 20 mg/dL Final  . Creatinine, Ser 08/06/2017 1.05  0.61 - 1.24 mg/dL Final  . Calcium 08/06/2017 8.2* 8.9 - 10.3 mg/dL Final  . GFR calc non Af Amer 08/06/2017 >60  >60 mL/min Final  . GFR calc  Af Amer 08/06/2017 >60  >60 mL/min Final   Comment: (NOTE) The eGFR has been calculated using the CKD EPI equation. This calculation has not been validated in all clinical situations. eGFR's persistently <60 mL/min signify possible Chronic Kidney Disease.   . Anion gap 08/06/2017 8  5 - 15 Final  . DAT, complement 08/05/2017 NEG   Final  . DAT, IgG 08/05/2017 NEG   Final  . Prothrombin Time 08/06/2017 15.4* 11.4 - 15.2 seconds Final  . INR 08/06/2017 1.23   Final  . aPTT 08/06/2017 28  24 - 36 seconds Final  . Fibrinogen 08/06/2017 326  210 - 475 mg/dL Final  . D-Dimer, Quant 08/06/2017 17.59* 0.00 - 0.50 ug/mL-FEU Final   Comment: (NOTE) At the manufacturer cut-off of 0.50 ug/mL FEU, this assay has been documented to exclude PE with a sensitivity and negative predictive value of 97 to 99%.  At this time, this assay has not been approved by the FDA to exclude DVT/VTE. Results should be correlated with clinical presentation.   . Platelets 08/06/2017 109* 150 - 400 K/uL Final   PLATELET COUNT CONFIRMED BY SMEAR  . Smear Review 08/06/2017 Schistocytes present   Final  . Retic Ct Pct 08/06/2017 7.9* 0.4 - 3.1 % Final  . RBC. 08/06/2017 3.13* 4.22 - 5.81 MIL/uL Final  . Retic Count, Absolute 08/06/2017 247.3* 19.0 - 186.0 K/uL Final  . Weight 08/06/2017 2,550.28  oz Final  . Height 08/06/2017 66  in Final  . BP 08/06/2017 152/64  mmHg Final  . LV PW d 08/06/2017 13.2* 0.6 - 1.1 mm Final  . FS 08/06/2017 25* 28 - 44 % Final  . LA vol 08/06/2017 70  mL Final  . Ao-asc 08/06/2017 33  cm Final  . LA ID, A-P, ES 08/06/2017  46  mm Final  . IVS/LV PW RATIO, ED 08/06/2017 1.04   Final  . Reg peak vel 08/06/2017 258  cm/s Final  . PV Reg vel dias 08/06/2017 121  cm/s Final  . PV Reg grad dias 08/06/2017 6  mmHg Final  . LA diam index 08/06/2017 2.49  cm/m2 Final  . LA vol A4C 08/06/2017 58.4  ml Final  . P 1/2 time 08/06/2017 299  ms Final  . E decel time 08/06/2017 183  msec Final  .  LVOT diameter 08/06/2017 19  mm Final  . LVOT area 08/06/2017 2.84  cm2 Final  . Peak grad 08/06/2017 6  mmHg Final  . MV pk E vel 08/06/2017 121  m/s Final  . TR max vel 08/06/2017 258  cm/s Final  . MV pk A vel 08/06/2017 54.3  m/s Final  . LA vol index 08/06/2017 37.9  mL/m2 Final  . MV Dec 08/06/2017 183   Final  . LA diam end sys 08/06/2017 46.00  mm Final  . TAPSE 08/06/2017 23.30  mm Final  . WBC 08/06/2017 6.2  4.0 - 10.5 K/uL Final  . RBC 08/06/2017 3.11* 4.22 - 5.81 MIL/uL Final  . Hemoglobin 08/06/2017 8.6* 13.0 - 17.0 g/dL Final   POST TRANSFUSION SPECIMEN  . HCT 08/06/2017 28.3* 39.0 - 52.0 % Final  . MCV 08/06/2017 91.0  78.0 - 100.0 fL Final   POST TRANSFUSION SPECIMEN  . MCH 08/06/2017 27.7  26.0 - 34.0 pg Final  . MCHC 08/06/2017 30.4  30.0 - 36.0 g/dL Final  . RDW 08/06/2017 21.8* 11.5 - 15.5 % Final   POST TRANSFUSION SPECIMEN  . Platelets 08/06/2017 121* 150 - 400 K/uL Final   PLATELET COUNT CONFIRMED BY SMEAR  . WBC 08/06/2017 5.3  4.0 - 10.5 K/uL Final  . RBC 08/06/2017 3.19* 4.22 - 5.81 MIL/uL Final  . Hemoglobin 08/06/2017 9.3* 13.0 - 17.0 g/dL Final  . HCT 08/06/2017 29.1* 39.0 - 52.0 % Final  . MCV 08/06/2017 91.2  78.0 - 100.0 fL Final  . MCH 08/06/2017 29.2  26.0 - 34.0 pg Final  . MCHC 08/06/2017 32.0  30.0 - 36.0 g/dL Final  . RDW 08/06/2017 22.3* 11.5 - 15.5 % Final  . Platelets 08/06/2017 116* 150 - 400 K/uL Final   PLATELET COUNT CONFIRMED BY SMEAR    (this displays the last labs from the last 3 days)  No results found for: TOTALPROTELP, ALBUMINELP, A1GS, A2GS, BETS, BETA2SER, GAMS, MSPIKE, SPEI (this displays SPEP labs)  No results found for: KPAFRELGTCHN, LAMBDASER, KAPLAMBRATIO (kappa/lambda light chains)  No results found for: HGBA, HGBA2QUANT, HGBFQUANT, HGBSQUAN (Hemoglobinopathy evaluation)   Lab Results  Component Value Date   LDH 1,647 (H) 08/05/2017    Lab Results  Component Value Date   IRON 53 08/06/2017   TIBC 190 (L)  08/06/2017   IRONPCTSAT 28 08/06/2017   (Iron and TIBC)  Lab Results  Component Value Date   FERRITIN 374 (H) 08/06/2017    Urinalysis    Component Value Date/Time   COLORURINE BROWN (A) 08/05/2017 [place] 08/05/2017 1435   LABSPEC 1.025 08/05/2017 1435   PHURINE 5.5 08/05/2017 1435   GLUCOSEU NEGATIVE 08/05/2017 1435   HGBUR LARGE (A) 08/05/2017 1435   BILIRUBINUR MODERATE (A) 08/05/2017 1435   KETONESUR NEGATIVE 08/05/2017 1435   PROTEINUR 100 (A) 08/05/2017 1435   NITRITE NEGATIVE 08/05/2017 1435   LEUKOCYTESUR NEGATIVE 08/05/2017 1435     STUDIES: Dg Chest  2 View  Result Date: 07/21/2017 CLINICAL DATA:  Bilateral pleural effusion. Status post aortic replacement. Weakness, cough. EXAM: CHEST  2 VIEW COMPARISON:  07/16/2017 FINDINGS: Improving lung volumes with decreasing bibasilar atelectasis. Small effusions remain. Changes of prior aortic valve replacement. Mild cardiomegaly. IMPRESSION: Improving aeration with decreasing bibasilar atelectasis. Continued small effusions. No pneumothorax. Electronically Signed   By: Rolm Baptise M.D.   On: 07/21/2017 11:19   Dg Chest 2 View  Result Date: 07/16/2017 CLINICAL DATA:  Patient status post aortic valve replacement 07/12/2017. EXAM: CHEST  2 VIEW COMPARISON:  Single-view of the chest 07/14/2017 and 07/13/2017. FINDINGS: Right IJ sheath has been removed. No pneumothorax. There small bilateral pleural effusions and basilar atelectasis. Prosthetic aortic valve is noted. Plate and screws for costal cartilage in rib fixation noted. Heart size is normal. No acute bone abnormality. IMPRESSION: Small bilateral pleural effusions and basilar atelectasis. Electronically Signed   By: Inge Rise M.D.   On: 07/16/2017 10:33   Dg Chest 2 View  Result Date: 07/09/2017 CLINICAL DATA:  Preop aortic valve replacement EXAM: CHEST  2 VIEW COMPARISON:  Chest CT 06/30/2017 FINDINGS: The heart size and mediastinal contours are  within normal limits. Both lungs are clear. The visualized skeletal structures are unremarkable. IMPRESSION: Normal chest. Electronically Signed   By: Ulyses Jarred M.D.   On: 07/09/2017 15:42   Dg Chest Port 1 View  Result Date: 08/05/2017 CLINICAL DATA:  Anemia and recent aortic valve replacement EXAM: PORTABLE CHEST 1 VIEW COMPARISON:  07/21/2017 FINDINGS: Stable appearance of mini thoracotomy fixation plate to the right of midline and prosthetic aortic valve. Lungs show mild bibasilar atelectasis. There is no evidence of pulmonary edema, consolidation, pneumothorax, nodule or pleural fluid. IMPRESSION: Mild bibasilar atelectasis.  No acute findings. Electronically Signed   By: Aletta Edouard M.D.   On: 08/05/2017 21:02   Dg Chest Port 1 View  Result Date: 07/14/2017 CLINICAL DATA:  57 year old male postoperative day 2 status post minimally invasive aortic valve replacement. EXAM: PORTABLE CHEST 1 VIEW COMPARISON:  07/13/2017 and earlier. FINDINGS: Portable AP upright view at 0652 hours. Right IJ approach Swan-Ganz catheter has been removed. The right IJ introducer sheath remains. 1 of the 2 right chest tubes has been removed. Lower lung volumes. No pneumothorax or pulmonary edema. Possible small pleural effusions. Increased left lung base opacity, favor atelectasis. Stable cardiac size and mediastinal contours. Aortic valve and right anterior rib malleable plate with screws re- demonstrated. IMPRESSION: 1. Swan-Ganz catheter and 1 of 2 chest tubes removed. 2. No pneumothorax or pulmonary edema. Lower lung volumes with increased atelectasis and probable small pleural effusions. Electronically Signed   By: Genevie Ann M.D.   On: 07/14/2017 08:39   Dg Chest Port 1 View  Result Date: 07/13/2017 CLINICAL DATA:  Status post aortic valve replacement. EXAM: PORTABLE CHEST 1 VIEW COMPARISON:  Radiograph July 12, 2017. FINDINGS: Stable cardiomediastinal silhouette. Status post aortic valve replacement.  Right-sided chest tube is unchanged in position without pneumothorax. Endotracheal and nasogastric tubes have been removed. Right internal jugular Swan-Ganz catheter is noted with tip in right pulmonary artery. Mild bibasilar subsegmental atelectasis is noted. Bony thorax is unremarkable. Degenerative changes seen involving both glenohumeral joints. IMPRESSION: Right-sided chest tube is unchanged in position without pneumothorax. Mild bibasilar subsegmental atelectasis. Electronically Signed   By: Marijo Conception, M.D.   On: 07/13/2017 09:36   Dg Chest Port 1 View  Result Date: 07/12/2017 CLINICAL DATA:  Aortic stenosis. Status post aortic valve  replacement. EXAM: PORTABLE CHEST 1 VIEW COMPARISON:  07/09/2017 FINDINGS: Endotracheal tube, Swan-Ganz catheter, NG tube, and chest tubes appear in good position. No pneumothorax. Heart size and vascularity are normal. Aortic atherosclerosis. Prosthetic aortic valve in place. Minimal atelectasis at the lung bases medially. No acute bone abnormality. Severe arthritic changes of both shoulders, right greater than left. IMPRESSION: Minimal atelectasis at the lung bases medially. Electronically Signed   By: Lorriane Shire M.D.   On: 07/12/2017 14:45    ELIGIBLE FOR AVAILABLE RESEARCH PROTOCOL: no  ASSESSMENT: 57 y.o. [place] man with a Hb of 6.2 noted 07/30/2017 with an LDH of 1647, absolute reticulocyte of 247.3, with haptoglobin pending, but normal B-12, folate, ferritin and creatinine and negative DAT for IgG and complement  (a) review of blood film show rouleaux, schistocytes (4-6/HPF)  (1) history of lupus, on plaquenil  (2) s/p aortic valve replacement 07/12/2017, tissue valve  (3) s/p RBC transfusion 07/19/2017 and prior  PLAN: I met with the patient and his wife and his hospital room.  We discussed anemia in general, hemolytic anemia in particular, and the possible causes of hemolytic anemia.  Given his history of lupus, autoimmune hemolytic  anemia seems the most likely explanation, although this is not supported by the DAT.  Plaquenil can cause hemolysis in patients with G6PD deficiency but I do not see "bite cells" in the smear and the patient does not have a known history of this problem.  Luis Ferguson had his most recent blood transfusion 07/19/2017.  It is possible that we are dealing with a delayed transfusion reaction.  In addition of course he had is aVR on 07/12/2017.  This was a tissue valve so it is unlikely to be a cause of hemolysis, however perhaps if there is a valvular leak that might be a possibility.  By auscultation today there was a grade 1 systolic murmur which would be expected.  Echocardiogram is in process.  Given the rest of the picture, which includes no fever, normal creatinine and platelet count as well as normal white cells, and no systemic symptoms except those directly attributable to anemia, we can feel comfortable that we are not dealing with TTP, HUS, DIC, or septic hemolysis.  In the absence of a definitive diagnosis and in the setting of autoimmune disease, I think most prudent would be to treat this as an autoimmune hemolytic anemia, and I have started the patient on prednisone at 60 mg daily.  He also received dexamethasone earlier this admission.  I understand the plan is for discharge this weekend and assuming he remains stable.  Discharge medications should include prednisone 60 mg daily and Protonix or another PPI, as well as folate.  I have scheduled follow-up in our office on 08/10/2017 at 9 AM and we will continue the workup and treatment at that time.  Please let me know if I can be of further help at this point  At this point, in the absence of a definite   Delsin has a good understanding of the overall plan. She agrees with it. She knows the goal of treatment in her case is cure. She will call with any problems that may develop before her next visit here.  Bobetta Lime, MD   08/06/2017 4:54  PM Medical Oncology and Hematology [facility] [address] [place], [place] [address] Tel. [phone]    Fax. [phone]

## 2017-08-06 NOTE — Progress Notes (Signed)
Patient is having tea colored urine. MD notified. Will continue to monitor.

## 2017-08-06 NOTE — Progress Notes (Signed)
  Echocardiogram 2D Echocardiogram has been performed.  Luis Ferguson Mylik Pro 08/06/2017, 4:19 PM

## 2017-08-07 DIAGNOSIS — L930 Discoid lupus erythematosus: Secondary | ICD-10-CM

## 2017-08-07 DIAGNOSIS — E440 Moderate protein-calorie malnutrition: Secondary | ICD-10-CM

## 2017-08-07 DIAGNOSIS — D590 Drug-induced autoimmune hemolytic anemia: Secondary | ICD-10-CM

## 2017-08-07 LAB — CBC WITH DIFFERENTIAL/PLATELET
BASOS ABS: 0 10*3/uL (ref 0.0–0.1)
Basophils Relative: 0 %
EOS ABS: 0 10*3/uL (ref 0.0–0.7)
Eosinophils Relative: 0 %
HCT: 27.1 % — ABNORMAL LOW (ref 39.0–52.0)
Hemoglobin: 8.8 g/dL — ABNORMAL LOW (ref 13.0–17.0)
LYMPHS PCT: 11 %
Lymphs Abs: 1.4 10*3/uL (ref 0.7–4.0)
MCH: 29.4 pg (ref 26.0–34.0)
MCHC: 32.5 g/dL (ref 30.0–36.0)
MCV: 90.6 fL (ref 78.0–100.0)
Monocytes Absolute: 1.1 10*3/uL — ABNORMAL HIGH (ref 0.1–1.0)
Monocytes Relative: 9 %
NEUTROS ABS: 10.1 10*3/uL — AB (ref 1.7–7.7)
NEUTROS PCT: 80 %
Platelets: 161 10*3/uL (ref 150–400)
RBC: 2.99 MIL/uL — AB (ref 4.22–5.81)
RDW: 23.1 % — ABNORMAL HIGH (ref 11.5–15.5)
WBC: 12.6 10*3/uL — AB (ref 4.0–10.5)

## 2017-08-07 MED ORDER — PREDNISONE 20 MG PO TABS: 60.0000 mg | ORAL_TABLET | Freq: Every day | ORAL | 1 refills | Status: DC

## 2017-08-07 MED ORDER — FOLIC ACID 1 MG PO TABS: 1.0000 mg | ORAL_TABLET | Freq: Every day | ORAL | 0 refills | Status: DC

## 2017-08-07 NOTE — Discharge Summary (Addendum)
Luis Ferguson, is a 57 y.o. male  DOB [birth year]  MRN [id number].  Admission date:  08/05/2017  Admitting Physician  Lorretta Harp, MD  Discharge Date:  08/07/2017   Primary MD  Milus Height, PA-C  Recommendations for primary care physician for things to follow:   Symptomatic hemolytic anemia. FOBT negative. Anemia panel unremarkable. LDH elevated. Schistocytes present. Retic high. Ddimer very high.  - Hgb 5.6 >> 9.3 s/p 2 units PRBC Pt seen by hematology, thought to have autoimmune hemolytic anemia Hgb 8.8 (08/07/2017) Cont prednisone 60mg  po qday F/u with Dr. Darnelle Catalan on 08/10/2017 9AM per his note  CBC on 08/10/2017 per hematology  Thrombocytopenia Stable CBC on 08/10/2017 per hematology  Connective tissue disease, followed by Dr. Kathi Ludwig, off prednisone since 2017, restarted on 08/06/2017 Cont Plaquenil F/u with Dr. Kathi Ludwig in 1 week  S/p bioprosthetic AVR 12/17.  - Cardiac echo EF wnl, no significant valvular disease  Protein calorie malnutrition (moderate) Recommend ensure or boost as outpatient  Admission Diagnosis  Anemia [D64.9] Symptomatic anemia [D64.9]   Discharge Diagnosis  Anemia [D64.9] Symptomatic anemia [D64.9]     Principal Problem:   Hemolytic anemia (HCC) Active Problems:   Lupus   S/P AVR   Symptomatic anemia   Thrombocytopenia (HCC)   Malnutrition of moderate degree      Past Medical History:  Diagnosis Date  . Aortic stenosis   . Arthritis    achiness in shoulder , elbows  . Connective tissue disease (HCC)    followed by Dr. Kathi Ludwig, [facility], pt. off Prednisone since mid 2017  . Fatigue   . Psoriasis    below the knee on both legs     Past Surgical History:  Procedure Laterality Date  . CARDIAC CATHETERIZATION    . HERNIA REPAIR  2016   left and right  . RIGHT/LEFT HEART CATH AND CORONARY ANGIOGRAPHY N/A 06/16/2017   Procedure: RIGHT/LEFT HEART CATH AND  CORONARY ANGIOGRAPHY;  Surgeon: Lyn Records, MD;  Location: [facility] INVASIVE CV LAB;  Service: Cardiovascular;  Laterality: N/A;  . TEE WITHOUT CARDIOVERSION N/A 07/12/2017   Procedure: TRANSESOPHAGEAL ECHOCARDIOGRAM (TEE);  Surgeon: [name], MD;  Location: [facility] OR;  Service: Open Heart Surgery;  Laterality: N/A;       HPI  from the history and physical done on the day of admission:     57 y.o. male with medical history significant of psoriasis, lupus, hyperlipidemia, GERD, iron deficiency anemia, aortic stenosis, s/p of aortic valve replacement with bio valve.  Patient states that he underwent aortic valve replacement with pig valve on 07/12/17. In the past several days, he has been feeling fatigued. Had one episode of lightheadedness. His PCP did the blood work on Friday, and found to have low hemoglobin 6.2. He is sent to ED for evaluation and treatment. Patient does not have chest pain, shortness breath, no active bleeding. Denies hematuria, hematochezia or hematemesis. Patient does not have nausea and vomiting, diarrhea, abdominal pain, symptoms of UTI. No unilateral  weakness. Patient states that he lost 15 pounds recently. His urine is dark.  ED Course: pt was found to have  hemoglobin 6.7. His hemoglobin was 10.9 on 07/12/17-->9.7 on 07/13/17. WBC 6.0, negative FOBT, electrolytes renal function okay, temperature normal, no tachycardia, oxygen sats are 98% on room air. Patient is placed on telemetry bed for observation.      Hospital Course:     Pt was admitted for anemia, FOBT negative.   Hgb 6.7 (08/05/2017)  LDH 1,647,  urinalysis biliruin moderate b12 351 Ferritin 374 Iron saturation 28% Folate 16 Retic ct 247  Pt was transfused with 2 units prbc,  Hgb 9.3 No evidence of bleeding, afebrile throughout stay. Renal function wnl.  Pt appears stable and will lbe discharged to home today per pt request.  Hgb 8.8 on discharge 08/07/2017.   Follow UP  Follow-up  Information    Magrinat, Valentino Hue, MD Follow up on 08/10/2017.   Specialty:  Oncology Why:  9AM this coming Tuesday per Dr. Princella Pellegrini note Contact information: [address] [place] [address] [phone]        Rossie Muskrat, MD Follow up in 1 week(s).   Specialty:  Rheumatology Contact information: [address] [place] [address] [place] [address] [phone]            Consults obtained - heme-onc  Discharge Condition: stable  Diet and Activity recommendation: See Discharge Instructions below  Discharge Instructions         Discharge Medications     Allergies as of 08/07/2017      Reactions   Ace Inhibitors Cough   Codeine Itching, Rash      Medication List    TAKE these medications   aspirin 81 MG tablet Take 81 mg by mouth daily.   atorvastatin 20 MG tablet Commonly known as:  LIPITOR Take 20 mg by mouth daily.   calcipotriene 0.005 % cream Commonly known as:  DOVONOX Apply 1 application topically 2 (two) times daily as needed.   calcium carbonate 750 MG chewable tablet Commonly known as:  TUMS EX Chew 1 tablet by mouth daily as needed for heartburn.   cholecalciferol 1000 units tablet Commonly known as:  VITAMIN D Take 1,000 Units by mouth daily.   clobetasol ointment 0.05 % Commonly known as:  TEMOVATE Apply 1 application topically 2 (two) times daily as needed.   ferrous sulfate 325 (65 FE) MG EC tablet Take 1 tablet (325 mg total) by mouth 2 (two) times daily with a meal.   folic acid 1 MG tablet Commonly known as:  FOLVITE Take 1 tablet (1 mg total) by mouth daily.   hydroxychloroquine 200 MG tablet Commonly known as:  PLAQUENIL Take 400 mg by mouth daily.   pantoprazole 40 MG tablet Commonly known as:  PROTONIX Take 1 tablet (40 mg total) by mouth daily.   predniSONE 20 MG tablet Commonly known as:  DELTASONE Take 3 tablets (60 mg total) by mouth daily.   scopolamine 1 MG/3DAYS Commonly known as:   TRANSDERM-SCOP Place 1 patch (1.5 mg total) onto the skin every 3 (three) days.       Major procedures and Radiology Reports - PLEASE review detailed and final reports for all details, in brief -      Dg Chest 2 View  Result Date: 07/21/2017 CLINICAL DATA:  Bilateral pleural effusion. Status post aortic replacement. Weakness, cough. EXAM: CHEST  2 VIEW COMPARISON:  07/16/2017 FINDINGS: Improving lung volumes with decreasing bibasilar  atelectasis. Small effusions remain. Changes of prior aortic valve replacement. Mild cardiomegaly. IMPRESSION: Improving aeration with decreasing bibasilar atelectasis. Continued small effusions. No pneumothorax. Electronically Signed   By: Charlett Nose M.D.   On: 07/21/2017 11:19   Dg Chest 2 View  Result Date: 07/16/2017 CLINICAL DATA:  Patient status post aortic valve replacement 07/12/2017. EXAM: CHEST  2 VIEW COMPARISON:  Single-view of the chest 07/14/2017 and 07/13/2017. FINDINGS: Right IJ sheath has been removed. No pneumothorax. There small bilateral pleural effusions and basilar atelectasis. Prosthetic aortic valve is noted. Plate and screws for costal cartilage in rib fixation noted. Heart size is normal. No acute bone abnormality. IMPRESSION: Small bilateral pleural effusions and basilar atelectasis. Electronically Signed   By: Drusilla Kanner M.D.   On: 07/16/2017 10:33   Dg Chest 2 View  Result Date: 07/09/2017 CLINICAL DATA:  Preop aortic valve replacement EXAM: CHEST  2 VIEW COMPARISON:  Chest CT 06/30/2017 FINDINGS: The heart size and mediastinal contours are within normal limits. Both lungs are clear. The visualized skeletal structures are unremarkable. IMPRESSION: Normal chest. Electronically Signed   By: Deatra Robinson M.D.   On: 07/09/2017 15:42   Dg Chest Port 1 View  Result Date: 08/05/2017 CLINICAL DATA:  Anemia and recent aortic valve replacement EXAM: PORTABLE CHEST 1 VIEW COMPARISON:  07/21/2017 FINDINGS: Stable appearance of mini  thoracotomy fixation plate to the right of midline and prosthetic aortic valve. Lungs show mild bibasilar atelectasis. There is no evidence of pulmonary edema, consolidation, pneumothorax, nodule or pleural fluid. IMPRESSION: Mild bibasilar atelectasis.  No acute findings. Electronically Signed   By: Irish Lack M.D.   On: 08/05/2017 21:02   Dg Chest Port 1 View  Result Date: 07/14/2017 CLINICAL DATA:  57 year old male postoperative day 2 status post minimally invasive aortic valve replacement. EXAM: PORTABLE CHEST 1 VIEW COMPARISON:  07/13/2017 and earlier. FINDINGS: Portable AP upright view at 0652 hours. Right IJ approach Swan-Ganz catheter has been removed. The right IJ introducer sheath remains. 1 of the 2 right chest tubes has been removed. Lower lung volumes. No pneumothorax or pulmonary edema. Possible small pleural effusions. Increased left lung base opacity, favor atelectasis. Stable cardiac size and mediastinal contours. Aortic valve and right anterior rib malleable plate with screws re- demonstrated. IMPRESSION: 1. Swan-Ganz catheter and 1 of 2 chest tubes removed. 2. No pneumothorax or pulmonary edema. Lower lung volumes with increased atelectasis and probable small pleural effusions. Electronically Signed   By: Odessa Fleming M.D.   On: 07/14/2017 08:39   Dg Chest Port 1 View  Result Date: 07/13/2017 CLINICAL DATA:  Status post aortic valve replacement. EXAM: PORTABLE CHEST 1 VIEW COMPARISON:  Radiograph July 12, 2017. FINDINGS: Stable cardiomediastinal silhouette. Status post aortic valve replacement. Right-sided chest tube is unchanged in position without pneumothorax. Endotracheal and nasogastric tubes have been removed. Right internal jugular Swan-Ganz catheter is noted with tip in right pulmonary artery. Mild bibasilar subsegmental atelectasis is noted. Bony thorax is unremarkable. Degenerative changes seen involving both glenohumeral joints. IMPRESSION: Right-sided chest tube is  unchanged in position without pneumothorax. Mild bibasilar subsegmental atelectasis. Electronically Signed   By: Lupita Raider, M.D.   On: 07/13/2017 09:36   Dg Chest Port 1 View  Result Date: 07/12/2017 CLINICAL DATA:  Aortic stenosis. Status post aortic valve replacement. EXAM: PORTABLE CHEST 1 VIEW COMPARISON:  07/09/2017 FINDINGS: Endotracheal tube, Swan-Ganz catheter, NG tube, and chest tubes appear in good position. No pneumothorax. Heart size and vascularity are normal. Aortic atherosclerosis.  Prosthetic aortic valve in place. Minimal atelectasis at the lung bases medially. No acute bone abnormality. Severe arthritic changes of both shoulders, right greater than left. IMPRESSION: Minimal atelectasis at the lung bases medially. Electronically Signed   By: Francene Boyers M.D.   On: 07/12/2017 14:45    Micro Results     No results found for this or any previous visit (from the past 240 hour(s)).     Today   Subjective    Devere Brem today has been afebrile. no bleeding. Hgb stable at 8.8.      No headache,no chest abdominal pain,no new weakness tingling or numbness, feels much better wants to go home today.    Objective   Blood pressure (!) 159/67, pulse 74, temperature 98.5 F (36.9 C), temperature source Oral, resp. rate 18, height 5\' 6"  (1.676 m), weight 72.4 kg (159 lb 9.8 oz), SpO2 100 %.   Intake/Output Summary (Last 24 hours) at 08/07/2017 0742 Last data filed at 08/07/2017 0600 Gross per 24 hour  Intake 1200 ml  Output 600 ml  Net 600 ml    Exam Awake Alert, Oriented x 3, No new F.N deficits, Normal affect [place].AT,PERRAL, pink conjunctiva Supple Neck,No JVD, No cervical lymphadenopathy appriciated.  Symmetrical Chest wall movement, Good air movement bilaterally, CTAB RRR,No Gallops,Rubs or new Murmurs, No Parasternal Heave +ve B.Sounds, Abd Soft, Non tender, No organomegaly appriciated, No rebound -guarding or rigidity. No Cyanosis, Clubbing or edema, No  new Rash or bruise   Data Review   CBC w Diff:  Lab Results  Component Value Date   WBC 12.6 (H) 08/07/2017   HGB 8.8 (L) 08/07/2017   HGB 6.2 (LL) 07/30/2017   HCT 27.1 (L) 08/07/2017   HCT 19.3 (L) 07/30/2017   PLT PENDING 08/07/2017   PLT 167 07/30/2017   LYMPHOPCT PENDING 08/07/2017   BANDSPCT PENDING 08/07/2017   MONOPCT PENDING 08/07/2017   EOSPCT PENDING 08/07/2017   BASOPCT PENDING 08/07/2017    CMP:  Lab Results  Component Value Date   NA 135 08/06/2017   NA 142 06/15/2017   K 3.5 08/06/2017   CL 105 08/06/2017   CO2 22 08/06/2017   BUN 18 08/06/2017   BUN 12 06/15/2017   CREATININE 1.05 08/06/2017   PROT 7.0 07/09/2017   ALBUMIN 3.9 07/09/2017   BILITOT 0.5 07/09/2017   ALKPHOS 57 07/09/2017   AST 36 07/09/2017   ALT 50 07/09/2017  .   Total Time in preparing paper work, data evaluation and todays exam - 35 minutes  Pearson Grippe M.D on 08/07/2017 at 7:42 AM  [facility]   Office  [phone]

## 2017-08-09 ENCOUNTER — Other Ambulatory Visit: Payer: Self-pay | Admitting: Oncology

## 2017-08-09 LAB — PATHOLOGIST SMEAR REVIEW

## 2017-08-09 NOTE — Progress Notes (Signed)
I asked Dr Rennis Golden to review the patient's echo; here is his note 08/07/2017:  The study does show a mild perivalvular leak - this was apparently noted just after surgery on the OR TEE - I recall Dr. Dorris Fetch mentioning this. I'm not convinced that this would cause hemolysis, since it is essentially no different than valvular insufficiency. I will CC: Andrey Spearman on this so that he can review the echo as well.   Thanks.   -[place]

## 2017-08-10 ENCOUNTER — Other Ambulatory Visit: Payer: Self-pay | Admitting: Oncology

## 2017-08-10 ENCOUNTER — Inpatient Hospital Stay: Payer: Commercial Managed Care - PPO

## 2017-08-10 ENCOUNTER — Encounter: Payer: Self-pay | Admitting: Adult Health

## 2017-08-10 ENCOUNTER — Inpatient Hospital Stay: Payer: Commercial Managed Care - PPO | Attending: Adult Health | Admitting: Adult Health

## 2017-08-10 ENCOUNTER — Telehealth: Payer: Self-pay | Admitting: Adult Health

## 2017-08-10 ENCOUNTER — Encounter: Payer: Self-pay | Admitting: Oncology

## 2017-08-10 VITALS — BP 180/85 | HR 74 | Temp 97.7°F | Resp 16 | Ht 66.0 in | Wt 152.2 lb

## 2017-08-10 DIAGNOSIS — K2100 Gastro-esophageal reflux disease with esophagitis, without bleeding: Secondary | ICD-10-CM

## 2017-08-10 DIAGNOSIS — D591 Other autoimmune hemolytic anemias: Secondary | ICD-10-CM | POA: Diagnosis not present

## 2017-08-10 DIAGNOSIS — D500 Iron deficiency anemia secondary to blood loss (chronic): Secondary | ICD-10-CM

## 2017-08-10 DIAGNOSIS — M329 Systemic lupus erythematosus, unspecified: Secondary | ICD-10-CM | POA: Diagnosis not present

## 2017-08-10 DIAGNOSIS — D590 Drug-induced autoimmune hemolytic anemia: Secondary | ICD-10-CM

## 2017-08-10 DIAGNOSIS — R059 Cough, unspecified: Secondary | ICD-10-CM

## 2017-08-10 DIAGNOSIS — D594 Other nonautoimmune hemolytic anemias: Secondary | ICD-10-CM

## 2017-08-10 DIAGNOSIS — E785 Hyperlipidemia, unspecified: Secondary | ICD-10-CM

## 2017-08-10 DIAGNOSIS — Z952 Presence of prosthetic heart valve: Secondary | ICD-10-CM

## 2017-08-10 DIAGNOSIS — I350 Nonrheumatic aortic (valve) stenosis: Secondary | ICD-10-CM

## 2017-08-10 DIAGNOSIS — L930 Discoid lupus erythematosus: Secondary | ICD-10-CM

## 2017-08-10 DIAGNOSIS — D649 Anemia, unspecified: Secondary | ICD-10-CM

## 2017-08-10 DIAGNOSIS — Q231 Congenital insufficiency of aortic valve: Secondary | ICD-10-CM

## 2017-08-10 DIAGNOSIS — R05 Cough: Secondary | ICD-10-CM

## 2017-08-10 LAB — RETICULOCYTES
RBC.: 2.82 MIL/uL — ABNORMAL LOW (ref 4.20–5.82)
Retic Count, Absolute: 245.3 10*3/uL — ABNORMAL HIGH (ref 34.8–93.9)
Retic Ct Pct: 8.7 % — ABNORMAL HIGH (ref 0.8–1.8)

## 2017-08-10 LAB — LACTATE DEHYDROGENASE: LDH: 2676 U/L — AB (ref 125–245)

## 2017-08-10 LAB — CBC WITH DIFFERENTIAL/PLATELET
Basophils Absolute: 0 10*3/uL (ref 0.0–0.1)
Basophils Relative: 0 %
EOS ABS: 0 10*3/uL (ref 0.0–0.5)
Eosinophils Relative: 0 %
HCT: 26.3 % — ABNORMAL LOW (ref 38.4–49.9)
HEMOGLOBIN: 8.1 g/dL — AB (ref 13.0–17.1)
LYMPHS ABS: 2.1 10*3/uL (ref 0.9–3.3)
Lymphocytes Relative: 16 %
MCH: 28.7 pg (ref 27.2–33.4)
MCHC: 30.8 g/dL — AB (ref 32.0–36.0)
MCV: 93.3 fL (ref 79.3–98.0)
MONO ABS: 0.9 10*3/uL (ref 0.1–0.9)
MONOS PCT: 7 %
NEUTROS PCT: 77 %
Neutro Abs: 9.9 10*3/uL — ABNORMAL HIGH (ref 1.5–6.5)
Platelets: 122 10*3/uL — ABNORMAL LOW (ref 140–400)
RBC: 2.82 MIL/uL — ABNORMAL LOW (ref 4.20–5.82)
RDW: 23.8 % — AB (ref 11.0–15.6)
WBC: 13 10*3/uL — ABNORMAL HIGH (ref 4.0–10.3)

## 2017-08-10 LAB — HAPTOGLOBIN

## 2017-08-10 LAB — HIV ANTIBODY (ROUTINE TESTING W REFLEX): HIV Screen 4th Generation wRfx: NONREACTIVE

## 2017-08-10 MED ORDER — PREDNISONE 20 MG PO TABS: 60.0000 mg | ORAL_TABLET | Freq: Every day | ORAL | 1 refills | Status: DC

## 2017-08-10 NOTE — Progress Notes (Addendum)
[facility]  [phone] [phone]     ID: Luis Ferguson DOB: 06/29/1961  MR#: [id number]  CSN#:[id number]  Patient Care Team: Redmon, Noelle, PA-C as PCP - General (Nurse Practitioner) Hilty, Kenneth C, MD as PCP - Cardiology (Cardiology) Deveshwar, Shaili, MD as Consulting Physician (Rheumatology) Lindsey C Causey, NP OTHER MD:  CHIEF COMPLAINT: Hemolytic anemia  CURRENT TREATMENT: Prednisone   HISTORY OF CURRENT ILLNESS: Luis Ferguson has a history of lupus which is managed I believe by Dr. Deveshwar (the patient was not certain of the name today).  He has been on Plaquenil and tells me his lupus has been very stable.  On 07/12/2017 he underwent valve replacement for severe aortic stenosis.  This was an EIE valve, which is a tissue valve.  He did generally well with the surgery and note that his hemoglobin preop was 12.5 (on 07/09/2017).  At the time of discharge from that procedure his hemoglobin remained low and on 07/19/2017 he was transfused 2 units of packed red cells.  He then followed up with cardiology on 07/30/2017 and at that time his hemoglobin was down to 6.2.  White cell count was 5.2 and platelets 267,000.  The MCV was essentially unchanged at 86.  The patient was eventually alerted to these results and asked to present for admission.  This happened yesterday, 08/05/2017, and admission workup included, in addition to the anemia findings, an LDH of 1647, a reticulocyte count of greater than 200, and normal creatinine, INR, and a PTT.  B12, folate, and ferritin were normal. The DIC panel did show schistocytes.  DAT was negative for IgG and complement.  Haptoglobin was <10  [name] subsequent history is as detailed below.  INTERVAL HISTORY: Luis Ferguson is here today for follow up of his anemia.  He was admitted to the hospital in October, 2018 for open heart surgery.  He was then readmitted last week for anemia with a hemoglobin of 6.2.  He  received 2 units of blood during his most recent hospital visit.  His hemoglobin on discharge was 8.8.  He met Dr. Magrinat during his hospitalization and was diagnosed with autoimmune hemolytic anemia and was placed on Prednisone daily.  He is tolerating the prednisone moderately well.  He did experience indigestion with it.  He is unsure whether or not he has been taking Protonix in the morning.    REVIEW OF SYSTEMS: He says since he was discharged from the hospital and received blood he has a lot more energy.  When he first arrived home he was vomiting, but he has improved.  He was coughing from his ACE inhibitor, but since he stopped taking it, he was much better and the vomiting stopped.  He denies DOE, he denies any other issues today and a detailed ROS is non contributory.    PAST MEDICAL HISTORY: Past Medical History:  Diagnosis Date  . Aortic stenosis   . Arthritis    achiness in shoulder , elbows  . Connective tissue disease (HCC)    followed by Dr. Syed, [facility], pt. off Prednisone since mid 2017  . Fatigue   . Psoriasis    below the knee on both legs     PAST SURGICAL HISTORY: Past Surgical History:  Procedure Laterality Date  . CARDIAC CATHETERIZATION    . HERNIA REPAIR  2016   left and right  . RIGHT/LEFT HEART CATH AND CORONARY ANGIOGRAPHY N/A 06/16/2017   Procedure: RIGHT/LEFT HEART CATH AND CORONARY ANGIOGRAPHY;  Surgeon:   Smith, Henry W, MD;  Location: [facility] INVASIVE CV LAB;  Service: Cardiovascular;  Laterality: N/A;  . TEE WITHOUT CARDIOVERSION N/A 07/12/2017   Procedure: TRANSESOPHAGEAL ECHOCARDIOGRAM (TEE);  Surgeon: Hendrickson, Steven C, MD;  Location: [facility] OR;  Service: Open Heart Surgery;  Laterality: N/A;    FAMILY HISTORY Family History  Problem Relation Age of Onset  . Diabetes Mother        also HTN  . CAD Father 70       CABGx3  . Liver cancer Brother   . Diabetes Brother    SOCIAL HISTORY:  He works for [employer], cutting metal.  He  denies any significant exposure at work.  His wife Donna works as an accountant.  They have no children.  They do have 2 dogs.  They belong to a local [place] in church          ADVANCED DIRECTIVES:    HEALTH MAINTENANCE: Social History   Tobacco Use  . Smoking status: Never Smoker  . Smokeless tobacco: Never Used  Substance Use Topics  . Alcohol use: No  . Drug use: No     Colonoscopy:  PSA:  Bone density:   Allergies  Allergen Reactions  . Ace Inhibitors Cough  . Codeine Itching and Rash    Current Outpatient Medications  Medication Sig Dispense Refill  . aspirin 81 MG tablet Take 81 mg by mouth daily.    . atorvastatin (LIPITOR) 20 MG tablet Take 20 mg by mouth daily.     . calcipotriene (DOVONOX) 0.005 % cream Apply 1 application topically 2 (two) times daily as needed.     . calcium carbonate (TUMS EX) 750 MG chewable tablet Chew 1 tablet by mouth daily as needed for heartburn.    . cholecalciferol (VITAMIN D) 1000 UNITS tablet Take 1,000 Units by mouth daily.    . clobetasol ointment (TEMOVATE) 0.05 % Apply 1 application topically 2 (two) times daily as needed.     . ferrous sulfate 325 (65 FE) MG EC tablet Take 1 tablet (325 mg total) by mouth 2 (two) times daily with a meal. 180 tablet 0  . folic acid (FOLVITE) 1 MG tablet Take 1 tablet (1 mg total) by mouth daily. 30 tablet 0  . hydroxychloroquine (PLAQUENIL) 200 MG tablet Take 400 mg by mouth daily.     . pantoprazole (PROTONIX) 40 MG tablet Take 1 tablet (40 mg total) by mouth daily. 90 tablet 0  . predniSONE (DELTASONE) 20 MG tablet Take 3 tablets (60 mg total) by mouth daily with breakfast. 90 tablet 1  . scopolamine (TRANSDERM-SCOP) 1 MG/3DAYS Place 1 patch (1.5 mg total) onto the skin every 3 (three) days. (Patient not taking: Reported on 08/05/2017) 3 patch 1   No current facility-administered medications for this visit.     OBJECTIVE:  Vitals:   08/10/17 0953  BP: (!) 180/85  Pulse: 74  Resp: 16    Temp: 97.7 F (36.5 C)  SpO2: 100%     Body mass index is 24.57 kg/m.   Wt Readings from Last 3 Encounters:  08/10/17 152 lb 3.2 oz (69 kg)  08/06/17 159 lb 9.8 oz (72.4 kg)  07/30/17 150 lb (68 kg)      ECOG FS:1 - Symptomatic but completely ambulatory GENERAL: Patient is a well appearing male in no acute distress HEENT:  Sclerae anicteric.  Oropharynx clear and moist. No ulcerations or evidence of oropharyngeal candidiasis. Neck is supple.  NODES:  No cervical,   supraclavicular, or axillary lymphadenopathy palpated.  BREAST EXAM:  Deferred. LUNGS:  Clear to auscultation bilaterally.  No wheezes or rhonchi. HEART:  Regular rate and rhythm. + systolic murmur ABDOMEN:  Soft, nontender.  Positive, normoactive bowel sounds. No organomegaly palpated. MSK:  No focal spinal tenderness to palpation. Full range of motion bilaterally in the upper extremities. EXTREMITIES:  No peripheral edema.   SKIN:  Clear with no obvious rashes or skin changes. No nail dyscrasia. NEURO:  Nonfocal. Well oriented.  Appropriate affect.      LAB RESULTS:  CMP     Component Value Date/Time   NA 135 08/06/2017 0437   NA 142 06/15/2017 1603   K 3.5 08/06/2017 0437   CL 105 08/06/2017 0437   CO2 22 08/06/2017 0437   GLUCOSE 111 (H) 08/06/2017 0437   BUN 18 08/06/2017 0437   BUN 12 06/15/2017 1603   CREATININE 1.05 08/06/2017 0437   CALCIUM 8.2 (L) 08/06/2017 0437   PROT 7.0 07/09/2017 1312   ALBUMIN 3.9 07/09/2017 1312   AST 36 07/09/2017 1312   ALT 50 07/09/2017 1312   ALKPHOS 57 07/09/2017 1312   BILITOT 0.5 07/09/2017 1312   GFRNONAA >60 08/06/2017 0437   GFRAA >60 08/06/2017 0437    No results found for: TOTALPROTELP, ALBUMINELP, A1GS, A2GS, BETS, BETA2SER, GAMS, MSPIKE, SPEI  No results found for: Nils Pyle, [facility]  Lab Results  Component Value Date   WBC 13.0 (H) 08/10/2017   NEUTROABS 9.9 (H) 08/10/2017   HGB 8.1 (L) 08/10/2017   HCT 26.3 (L) 08/10/2017    MCV 93.3 08/10/2017   PLT 122 (L) 08/10/2017      Chemistry      Component Value Date/Time   NA 135 08/06/2017 0437   NA 142 06/15/2017 1603   K 3.5 08/06/2017 0437   CL 105 08/06/2017 0437   CO2 22 08/06/2017 0437   BUN 18 08/06/2017 0437   BUN 12 06/15/2017 1603   CREATININE 1.05 08/06/2017 0437      Component Value Date/Time   CALCIUM 8.2 (L) 08/06/2017 0437   ALKPHOS 57 07/09/2017 1312   AST 36 07/09/2017 1312   ALT 50 07/09/2017 1312   BILITOT 0.5 07/09/2017 1312       No results found for: LABCA2  No components found for: [id number]  Recent Labs  Lab 08/06/17 0436  INR 1.23    No results found for: LABCA2  No results found for: QHU765  No results found for: YYT035  No results found for: WSF681  No results found for: CA2729  No components found for: HGQUANT  No results found for: CEA1 / No results found for: CEA1   No results found for: AFPTUMOR  No results found for: CHROMOGRNA  No results found for: PSA1  Appointment on 08/10/2017  Component Date Value Ref Range Status  . WBC 08/10/2017 13.0* 4.0 - 10.3 K/uL Final  . RBC 08/10/2017 2.82* 4.20 - 5.82 MIL/uL Final  . Hemoglobin 08/10/2017 8.1* 13.0 - 17.1 g/dL Final  . HCT 08/10/2017 26.3* 38.4 - 49.9 % Final  . MCV 08/10/2017 93.3  79.3 - 98.0 fL Final  . MCH 08/10/2017 28.7  27.2 - 33.4 pg Final  . MCHC 08/10/2017 30.8* 32.0 - 36.0 g/dL Final  . RDW 08/10/2017 23.8* 11.0 - 15.6 % Final  . Platelets 08/10/2017 122* 140 - 400 K/uL Final  . Neutrophils Relative % 08/10/2017 77  % Final  . Neutro Abs 08/10/2017 9.9* 1.5 - 6.5 K/uL  Final  . Lymphocytes Relative 08/10/2017 16  % Final  . Lymphs Abs 08/10/2017 2.1  0.9 - 3.3 K/uL Final  . Monocytes Relative 08/10/2017 7  % Final  . Monocytes Absolute 08/10/2017 0.9  0.1 - 0.9 K/uL Final  . Eosinophils Relative 08/10/2017 0  % Final  . Eosinophils Absolute 08/10/2017 0.0  0.0 - 0.5 K/uL Final  . Basophils Relative 08/10/2017 0  % Final  .  Basophils Absolute 08/10/2017 0.0  0.0 - 0.1 K/uL Final   Performed at [facility], [place] [address], [place], [place] [address]  . Retic Ct Pct 08/10/2017 8.7* 0.8 - 1.8 % Final  . RBC. 08/10/2017 2.82* 4.20 - 5.82 MIL/uL Final  . Retic Count, Absolute 08/10/2017 245.3* 34.8 - 93.9 K/uL Final   Performed at [facility], [place] [address], [place], [place] [address]    (this displays the last labs from the last 3 days)  No results found for: TOTALPROTELP, ALBUMINELP, A1GS, A2GS, BETS, BETA2SER, GAMS, MSPIKE, SPEI (this displays SPEP labs)  No results found for: KPAFRELGTCHN, LAMBDASER, KAPLAMBRATIO (kappa/lambda light chains)  No results found for: HGBA, HGBA2QUANT, HGBFQUANT, HGBSQUAN (Hemoglobinopathy evaluation)   Lab Results  Component Value Date   LDH 1,647 (H) 08/05/2017    Lab Results  Component Value Date   IRON 53 08/06/2017   TIBC 190 (L) 08/06/2017   IRONPCTSAT 28 08/06/2017   (Iron and TIBC)  Lab Results  Component Value Date   FERRITIN 374 (H) 08/06/2017    Urinalysis    Component Value Date/Time   COLORURINE BROWN (A) 08/05/2017 [place] 08/05/2017 1435   LABSPEC 1.025 08/05/2017 1435   PHURINE 5.5 08/05/2017 1435   GLUCOSEU NEGATIVE 08/05/2017 1435   HGBUR LARGE (A) 08/05/2017 1435   BILIRUBINUR MODERATE (A) 08/05/2017 1435   KETONESUR NEGATIVE 08/05/2017 1435   PROTEINUR 100 (A) 08/05/2017 1435   NITRITE NEGATIVE 08/05/2017 1435   LEUKOCYTESUR NEGATIVE 08/05/2017 1435     STUDIES: Dg Chest 2 View  Result Date: 07/21/2017 CLINICAL DATA:  Bilateral pleural effusion. Status post aortic replacement. Weakness, cough. EXAM: CHEST  2 VIEW COMPARISON:  07/16/2017 FINDINGS: Improving lung volumes with decreasing bibasilar atelectasis. Small effusions remain. Changes of prior aortic valve replacement. Mild cardiomegaly. IMPRESSION: Improving aeration with decreasing bibasilar atelectasis.  Continued small effusions. No pneumothorax. Electronically Signed   By: Rolm Baptise M.D.   On: 07/21/2017 11:19   Dg Chest 2 View  Result Date: 07/16/2017 CLINICAL DATA:  Patient status post aortic valve replacement 07/12/2017. EXAM: CHEST  2 VIEW COMPARISON:  Single-view of the chest 07/14/2017 and 07/13/2017. FINDINGS: Right IJ sheath has been removed. No pneumothorax. There small bilateral pleural effusions and basilar atelectasis. Prosthetic aortic valve is noted. Plate and screws for costal cartilage in rib fixation noted. Heart size is normal. No acute bone abnormality. IMPRESSION: Small bilateral pleural effusions and basilar atelectasis. Electronically Signed   By: Inge Rise M.D.   On: 07/16/2017 10:33   Dg Chest Port 1 View  Result Date: 08/05/2017 CLINICAL DATA:  Anemia and recent aortic valve replacement EXAM: PORTABLE CHEST 1 VIEW COMPARISON:  07/21/2017 FINDINGS: Stable appearance of mini thoracotomy fixation plate to the right of midline and prosthetic aortic valve. Lungs show mild bibasilar atelectasis. There is no evidence of pulmonary edema, consolidation, pneumothorax, nodule or pleural fluid. IMPRESSION: Mild bibasilar atelectasis.  No acute findings. Electronically Signed   By: Aletta Edouard M.D.   On:  08/05/2017 21:02   Dg Chest Port 1 View  Result Date: 07/14/2017 CLINICAL DATA:  57 year old male postoperative day 2 status post minimally invasive aortic valve replacement. EXAM: PORTABLE CHEST 1 VIEW COMPARISON:  07/13/2017 and earlier. FINDINGS: Portable AP upright view at 0652 hours. Right IJ approach Swan-Ganz catheter has been removed. The right IJ introducer sheath remains. 1 of the 2 right chest tubes has been removed. Lower lung volumes. No pneumothorax or pulmonary edema. Possible small pleural effusions. Increased left lung base opacity, favor atelectasis. Stable cardiac size and mediastinal contours. Aortic valve and right anterior rib malleable plate with  screws re- demonstrated. IMPRESSION: 1. Swan-Ganz catheter and 1 of 2 chest tubes removed. 2. No pneumothorax or pulmonary edema. Lower lung volumes with increased atelectasis and probable small pleural effusions. Electronically Signed   By: Genevie Ann M.D.   On: 07/14/2017 08:39   Dg Chest Port 1 View  Result Date: 07/13/2017 CLINICAL DATA:  Status post aortic valve replacement. EXAM: PORTABLE CHEST 1 VIEW COMPARISON:  Radiograph July 12, 2017. FINDINGS: Stable cardiomediastinal silhouette. Status post aortic valve replacement. Right-sided chest tube is unchanged in position without pneumothorax. Endotracheal and nasogastric tubes have been removed. Right internal jugular Swan-Ganz catheter is noted with tip in right pulmonary artery. Mild bibasilar subsegmental atelectasis is noted. Bony thorax is unremarkable. Degenerative changes seen involving both glenohumeral joints. IMPRESSION: Right-sided chest tube is unchanged in position without pneumothorax. Mild bibasilar subsegmental atelectasis. Electronically Signed   By: Marijo Conception, M.D.   On: 07/13/2017 09:36   Dg Chest Port 1 View  Result Date: 07/12/2017 CLINICAL DATA:  Aortic stenosis. Status post aortic valve replacement. EXAM: PORTABLE CHEST 1 VIEW COMPARISON:  07/09/2017 FINDINGS: Endotracheal tube, Swan-Ganz catheter, NG tube, and chest tubes appear in good position. No pneumothorax. Heart size and vascularity are normal. Aortic atherosclerosis. Prosthetic aortic valve in place. Minimal atelectasis at the lung bases medially. No acute bone abnormality. Severe arthritic changes of both shoulders, right greater than left. IMPRESSION: Minimal atelectasis at the lung bases medially. Electronically Signed   By: Lorriane Shire M.D.   On: 07/12/2017 14:45    ELIGIBLE FOR AVAILABLE RESEARCH PROTOCOL: no  ASSESSMENT: 57 y.o.  man with a Hb of 6.2 noted 07/30/2017, with an LDH of 1647, absolute reticulocyte of 247.3, with haptoglobin  10,  but normal B-12, folate, ferritin and creatinine, PT and APTT and negative DAT for IgG and complement             (a) review of blood film show rouleaux, schistocytes (4-6/HPF)  (1) history of lupus, on plaquenil  (2) s/p aortic valve replacement 07/12/2017, tissue valve     (a) echo 08/06/2017 showed mild perivalvular regurgitation (noted at time of surgery)  (3) s/p RBC transfusion 07/19/2017 and prior  (4) prednisone 60 mg/day started 08/06/2017  PLAN: I reviewed this patient with Dr. Jana Hakim who also came and met with the patient today.  Alger's got an autoimmune hemolytic anemia.  His retic remains elevated and hemoglobin is slightly lower.  For now, he will remain on the Prednisone 46m per day.  He will return for weekly lab checks, and he will see Dr. MJana Hakimin 3 weeks for follow up.  If his hemoglobin hasn't started improving at that point, he will need to change treatment.  This was reviewed with RJori Mollin detail.  RInakiwas also recommended to take Protonix first thing in the morning to help neutralize his stomach acids prior to take the  Prednisone.  Naji will call if he continues to have issues with indigestion.    He will return weekly for labs, and in 3 weeks for f/u with Dr. Magrinat.  He knows to call for any questions prior to his next appointment.     Lindsey Causey, NP   08/10/2017 12:59 PM Medical Oncology and Hematology [facility] [address] [place], [place] [address] Tel. [phone]    Fax. [phone]  ADDENDUM: Luis Ferguson is tolerating the prednisone moderately well.  He is having some gastritis likely related to it.  We are changing his PPI to morning instead of evening and see if that helps.  If not we will go to twice daily.  Despite the terrific reticulocyte count he is still slowly drifting down on the hemoglobin.  The LDH is actually higher now than 5 days ago.  I am treating for immune hemolysis on the basis of the of lupus,  and in the absence of another obvious cause.  We did see some schistocytes in the blood film and he does have mild regurgitation around his AV.  On my reading tissue valves can cause hemolysis up to 5% of the time, but it is usually mild.  Dr. Hendrickson is trying to review the echo and if we cannot located or is is not good enough quality we can always get a TEE.  The fly in the ointment is the fact that the DAT was negative.  DAT however is negative for IgA antibodies and sometimes for other technical reasons.  If we do not begin to see a response within 2 weeks we will have to reconsider, and perhaps add rituximab.  I note that Plaquenil can cause hemolysis in the setting of G6PD deficiency.  However given the reticulocyte count now we cannot test for that.  It issafer to simply stop that medication since he is being covered with a high dose of steroids at present. We did that today  We're going to have to follow him very closely and will check labs weekly.  He will call us if he starts feeling more short of breath or weaker as he did before.  He will see me again for a visit in addition to labs on 08/31/2017  I personally saw this patient and performed a substantive portion of this encounter with the listed APP documented above.   Gastav C Magrinat, MD Medical Oncology and Hematology [facility] [address] [place], [place] [address] Tel. [phone]    Fax. [phone]   

## 2017-08-10 NOTE — Addendum Note (Signed)
Addended by: Lowella Dell on: 08/10/2017 06:52 PM   Modules accepted: Orders

## 2017-08-10 NOTE — Telephone Encounter (Signed)
Gave patient avs and calendar with appts per 1/15 los.  °

## 2017-08-11 ENCOUNTER — Other Ambulatory Visit: Payer: Self-pay | Admitting: *Deleted

## 2017-08-11 ENCOUNTER — Other Ambulatory Visit: Payer: Self-pay | Admitting: Oncology

## 2017-08-11 DIAGNOSIS — D649 Anemia, unspecified: Secondary | ICD-10-CM

## 2017-08-11 DIAGNOSIS — K2100 Gastro-esophageal reflux disease with esophagitis, without bleeding: Secondary | ICD-10-CM

## 2017-08-11 DIAGNOSIS — D500 Iron deficiency anemia secondary to blood loss (chronic): Secondary | ICD-10-CM

## 2017-08-11 DIAGNOSIS — D590 Drug-induced autoimmune hemolytic anemia: Secondary | ICD-10-CM

## 2017-08-11 DIAGNOSIS — D591 Other autoimmune hemolytic anemias: Secondary | ICD-10-CM | POA: Diagnosis not present

## 2017-08-11 DIAGNOSIS — Q231 Congenital insufficiency of aortic valve: Secondary | ICD-10-CM

## 2017-08-11 DIAGNOSIS — E785 Hyperlipidemia, unspecified: Secondary | ICD-10-CM

## 2017-08-11 DIAGNOSIS — Z952 Presence of prosthetic heart valve: Secondary | ICD-10-CM

## 2017-08-11 DIAGNOSIS — L930 Discoid lupus erythematosus: Secondary | ICD-10-CM

## 2017-08-11 DIAGNOSIS — R05 Cough: Secondary | ICD-10-CM

## 2017-08-11 DIAGNOSIS — I350 Nonrheumatic aortic (valve) stenosis: Secondary | ICD-10-CM

## 2017-08-11 DIAGNOSIS — R059 Cough, unspecified: Secondary | ICD-10-CM

## 2017-08-11 LAB — COMPREHENSIVE METABOLIC PANEL
ALBUMIN: 3.6 g/dL (ref 3.5–5.0)
ALK PHOS: 71 U/L (ref 40–150)
ALT: 59 U/L — AB (ref 0–55)
ANION GAP: 11 (ref 3–11)
AST: 110 U/L — ABNORMAL HIGH (ref 5–34)
BILIRUBIN TOTAL: 2.4 mg/dL — AB (ref 0.2–1.2)
BUN: 24 mg/dL (ref 7–26)
CALCIUM: 8.7 mg/dL (ref 8.4–10.4)
CO2: 21 mmol/L — ABNORMAL LOW (ref 22–29)
CREATININE: 0.85 mg/dL (ref 0.70–1.30)
Chloride: 108 mmol/L (ref 98–109)
GFR calc Af Amer: >60 mL/min (ref >60)
GFR calc non Af Amer: >60 mL/min (ref >60)
GLUCOSE: 99 mg/dL (ref 70–140)
Potassium: 3.8 mmol/L (ref 3.5–5.1)
Sodium: 140 mmol/L (ref 136–145)
TOTAL PROTEIN: 6.8 g/dL (ref 6.4–8.3)

## 2017-08-11 LAB — HAPTOGLOBIN: Haptoglobin: <10 mg/dL — ABNORMAL LOW (ref 34–200)

## 2017-08-12 ENCOUNTER — Telehealth: Payer: Self-pay | Admitting: *Deleted

## 2017-08-12 NOTE — Telephone Encounter (Signed)
This RN spoke with pt per call to him per need for lab appointment tomorrow - discussed MD concern to check for possible need for transfusion - which could be done over the weekend.    Luis Ferguson verbalized date, time and location as well as read back what he wrote down.

## 2017-08-12 NOTE — Addendum Note (Signed)
Addended by: Lowella Dell on: 08/12/2017 08:36 AM   Modules accepted: Orders

## 2017-08-13 ENCOUNTER — Other Ambulatory Visit: Payer: Self-pay | Admitting: *Deleted

## 2017-08-13 ENCOUNTER — Inpatient Hospital Stay: Payer: Commercial Managed Care - PPO

## 2017-08-13 ENCOUNTER — Other Ambulatory Visit: Payer: Self-pay | Admitting: Oncology

## 2017-08-13 DIAGNOSIS — D649 Anemia, unspecified: Secondary | ICD-10-CM

## 2017-08-13 DIAGNOSIS — D598 Other acquired hemolytic anemias: Secondary | ICD-10-CM

## 2017-08-13 DIAGNOSIS — D591 Other autoimmune hemolytic anemias: Secondary | ICD-10-CM | POA: Diagnosis not present

## 2017-08-13 DIAGNOSIS — D500 Iron deficiency anemia secondary to blood loss (chronic): Secondary | ICD-10-CM

## 2017-08-13 DIAGNOSIS — D696 Thrombocytopenia, unspecified: Secondary | ICD-10-CM

## 2017-08-13 DIAGNOSIS — R059 Cough, unspecified: Secondary | ICD-10-CM

## 2017-08-13 DIAGNOSIS — E785 Hyperlipidemia, unspecified: Secondary | ICD-10-CM

## 2017-08-13 DIAGNOSIS — K2100 Gastro-esophageal reflux disease with esophagitis, without bleeding: Secondary | ICD-10-CM

## 2017-08-13 DIAGNOSIS — D582 Other hemoglobinopathies: Secondary | ICD-10-CM

## 2017-08-13 DIAGNOSIS — Q231 Congenital insufficiency of aortic valve: Secondary | ICD-10-CM

## 2017-08-13 DIAGNOSIS — L930 Discoid lupus erythematosus: Secondary | ICD-10-CM

## 2017-08-13 DIAGNOSIS — R05 Cough: Secondary | ICD-10-CM

## 2017-08-13 DIAGNOSIS — I350 Nonrheumatic aortic (valve) stenosis: Secondary | ICD-10-CM

## 2017-08-13 DIAGNOSIS — D590 Drug-induced autoimmune hemolytic anemia: Secondary | ICD-10-CM

## 2017-08-13 DIAGNOSIS — D594 Other nonautoimmune hemolytic anemias: Secondary | ICD-10-CM

## 2017-08-13 DIAGNOSIS — Z952 Presence of prosthetic heart valve: Secondary | ICD-10-CM

## 2017-08-13 DIAGNOSIS — Q2381 Bicuspid aortic valve: Secondary | ICD-10-CM

## 2017-08-13 LAB — CBC WITH DIFFERENTIAL/PLATELET
BASOS PCT: 0 %
Basophils Absolute: 0 10*3/uL (ref 0.0–0.1)
Eosinophils Absolute: 0 10*3/uL (ref 0.0–0.5)
Eosinophils Relative: 0 %
HCT: 24.3 % — ABNORMAL LOW (ref 38.4–49.9)
HEMOGLOBIN: 7.5 g/dL — AB (ref 13.0–17.1)
LYMPHS PCT: 11 %
Lymphs Abs: 1.4 10*3/uL (ref 0.9–3.3)
MCH: 29 pg (ref 27.2–33.4)
MCHC: 30.9 g/dL — AB (ref 32.0–36.0)
MCV: 93.8 fL (ref 79.3–98.0)
MONOS PCT: 11 %
Monocytes Absolute: 1.4 10*3/uL — ABNORMAL HIGH (ref 0.1–0.9)
NEUTROS PCT: 78 %
Neutro Abs: 9.5 10*3/uL — ABNORMAL HIGH (ref 1.5–6.5)
Platelets: 137 10*3/uL — ABNORMAL LOW (ref 140–400)
RBC: 2.59 MIL/uL — AB (ref 4.20–5.82)
RDW: 25.4 % — ABNORMAL HIGH (ref 11.0–15.6)
WBC: 12.3 10*3/uL — ABNORMAL HIGH (ref 4.0–10.3)

## 2017-08-13 LAB — ABO/RH: ABO/RH(D): O POS

## 2017-08-13 LAB — PREPARE RBC (CROSSMATCH)

## 2017-08-13 LAB — COMPREHENSIVE METABOLIC PANEL
ALT: 57 U/L — ABNORMAL HIGH (ref 0–55)
AST: 92 U/L — AB (ref 5–34)
Albumin: 3.5 g/dL (ref 3.5–5.0)
Alkaline Phosphatase: 60 U/L (ref 40–150)
Anion gap: 8 (ref 3–11)
BUN: 24 mg/dL (ref 7–26)
CHLORIDE: 106 mmol/L (ref 98–109)
CO2: 25 mmol/L (ref 22–29)
Calcium: 8.7 mg/dL (ref 8.4–10.4)
Creatinine, Ser: 0.83 mg/dL (ref 0.70–1.30)
GFR calc Af Amer: >60 mL/min (ref >60)
GFR calc non Af Amer: >60 mL/min (ref >60)
GLUCOSE: 88 mg/dL (ref 70–140)
POTASSIUM: 3.8 mmol/L (ref 3.5–5.1)
Sodium: 139 mmol/L (ref 136–145)
Total Bilirubin: 2.6 mg/dL — ABNORMAL HIGH (ref 0.2–1.2)
Total Protein: 6.6 g/dL (ref 6.4–8.3)

## 2017-08-13 LAB — RETICULOCYTES
RBC.: 2.59 MIL/uL — AB (ref 4.20–5.82)
RETIC COUNT ABSOLUTE: 295.3 10*3/uL — AB (ref 34.8–93.9)
Retic Ct Pct: 11.4 % — ABNORMAL HIGH (ref 0.8–1.8)

## 2017-08-13 LAB — SAVE SMEAR

## 2017-08-13 LAB — LACTATE DEHYDROGENASE: LDH: 2597 U/L — ABNORMAL HIGH (ref 125–245)

## 2017-08-13 MED ORDER — DIPHENHYDRAMINE HCL 25 MG PO CAPS
25.0000 mg | ORAL_CAPSULE | Freq: Once | ORAL | Status: AC
  Administered 2017-08-13: 25 mg via ORAL

## 2017-08-13 MED ORDER — SODIUM CHLORIDE 0.9 % IV SOLN
250.0000 mL | Freq: Once | INTRAVENOUS | Status: AC
  Administered 2017-08-13: 250 mL via INTRAVENOUS

## 2017-08-13 MED ORDER — ACETAMINOPHEN 325 MG PO TABS
650.0000 mg | ORAL_TABLET | Freq: Once | ORAL | Status: AC
  Administered 2017-08-13: 650 mg via ORAL

## 2017-08-13 NOTE — Progress Notes (Signed)
We asked Luis Ferguson to come in today just to recheck his counts to make sure if he needed a transfusion this could be done before the weekend so he would not need to be in the emergency room.  In fact his hemoglobin is further down today.  The platelets however are little bit higher up.  Review of the blood film shows occasional schistocytes but I really think these are going to be secondary to valve issues and I do not think that is the primary cause of the patient's hemolysis.  We are sending an Adamts13 which should be ready early next week.  However I would expect the platelet count to be low or not higher given the degree of hemolysis here.  I do not think we are dealing with TTP.  I am sending flow cytometry for PNH.  We should have some results by the time of his next lab draw which is 08/17/2017.

## 2017-08-13 NOTE — Patient Instructions (Signed)

## 2017-08-14 LAB — BPAM RBC
Blood Product Expiration Date: 201902142359
Blood Product Expiration Date: 201902142359
ISSUE DATE / TIME: 201901181133
ISSUE DATE / TIME: 201901181133
UNIT TYPE AND RH: 5100
Unit Type and Rh: 5100

## 2017-08-14 LAB — TYPE AND SCREEN
ABO/RH(D): O POS
ANTIBODY SCREEN: NEGATIVE
UNIT DIVISION: 0
Unit division: 0

## 2017-08-14 LAB — HAPTOGLOBIN

## 2017-08-14 LAB — ADAMTS13 ACTIVITY: Adamts 13 Activity: 73 % (ref >66.8)

## 2017-08-14 LAB — ADAMTS13 ACTIVITY REFLEX

## 2017-08-16 ENCOUNTER — Other Ambulatory Visit: Payer: Self-pay | Admitting: Oncology

## 2017-08-16 DIAGNOSIS — D599 Acquired hemolytic anemia, unspecified: Secondary | ICD-10-CM

## 2017-08-17 ENCOUNTER — Inpatient Hospital Stay: Payer: Commercial Managed Care - PPO

## 2017-08-17 ENCOUNTER — Other Ambulatory Visit: Payer: Self-pay | Admitting: Oncology

## 2017-08-17 ENCOUNTER — Ambulatory Visit: Payer: Self-pay | Admitting: Thoracic Surgery (Cardiothoracic Vascular Surgery)

## 2017-08-17 DIAGNOSIS — D590 Drug-induced autoimmune hemolytic anemia: Secondary | ICD-10-CM

## 2017-08-17 DIAGNOSIS — K2100 Gastro-esophageal reflux disease with esophagitis, without bleeding: Secondary | ICD-10-CM

## 2017-08-17 DIAGNOSIS — D500 Iron deficiency anemia secondary to blood loss (chronic): Secondary | ICD-10-CM

## 2017-08-17 DIAGNOSIS — R059 Cough, unspecified: Secondary | ICD-10-CM

## 2017-08-17 DIAGNOSIS — Z952 Presence of prosthetic heart valve: Secondary | ICD-10-CM

## 2017-08-17 DIAGNOSIS — I350 Nonrheumatic aortic (valve) stenosis: Secondary | ICD-10-CM

## 2017-08-17 DIAGNOSIS — Q231 Congenital insufficiency of aortic valve: Secondary | ICD-10-CM

## 2017-08-17 DIAGNOSIS — R05 Cough: Secondary | ICD-10-CM

## 2017-08-17 DIAGNOSIS — D599 Acquired hemolytic anemia, unspecified: Secondary | ICD-10-CM

## 2017-08-17 DIAGNOSIS — E785 Hyperlipidemia, unspecified: Secondary | ICD-10-CM

## 2017-08-17 DIAGNOSIS — M329 Systemic lupus erythematosus, unspecified: Secondary | ICD-10-CM | POA: Diagnosis not present

## 2017-08-17 DIAGNOSIS — D594 Other nonautoimmune hemolytic anemias: Secondary | ICD-10-CM

## 2017-08-17 DIAGNOSIS — D649 Anemia, unspecified: Secondary | ICD-10-CM

## 2017-08-17 DIAGNOSIS — I7300 Raynaud's syndrome without gangrene: Secondary | ICD-10-CM | POA: Diagnosis not present

## 2017-08-17 DIAGNOSIS — L930 Discoid lupus erythematosus: Secondary | ICD-10-CM

## 2017-08-17 DIAGNOSIS — D591 Other autoimmune hemolytic anemias: Secondary | ICD-10-CM | POA: Diagnosis not present

## 2017-08-17 LAB — CBC WITH DIFFERENTIAL/PLATELET
BASOS ABS: 0 10*3/uL (ref 0.0–0.1)
Basophils Relative: 0 %
EOS ABS: 0 10*3/uL (ref 0.0–0.5)
Eosinophils Relative: 0 %
HEMATOCRIT: 29.7 % — AB (ref 38.4–49.9)
HEMOGLOBIN: 9.2 g/dL — AB (ref 13.0–17.1)
LYMPHS PCT: 13 %
Lymphs Abs: 1.9 10*3/uL (ref 0.9–3.3)
MCH: 28.9 pg (ref 27.2–33.4)
MCHC: 31 g/dL — ABNORMAL LOW (ref 32.0–36.0)
MCV: 93.4 fL (ref 79.3–98.0)
Monocytes Absolute: 1 10*3/uL — ABNORMAL HIGH (ref 0.1–0.9)
Monocytes Relative: 7 %
NEUTROS ABS: 11.2 10*3/uL — AB (ref 1.5–6.5)
NEUTROS PCT: 80 %
Platelets: 113 10*3/uL — ABNORMAL LOW (ref 140–400)
RBC: 3.18 MIL/uL — ABNORMAL LOW (ref 4.20–5.82)
RDW: 26.1 % — ABNORMAL HIGH (ref 11.0–15.6)
WBC: 14.2 10*3/uL — ABNORMAL HIGH (ref 4.0–10.3)

## 2017-08-17 LAB — COMPREHENSIVE METABOLIC PANEL
ALBUMIN: 3.5 g/dL (ref 3.5–5.0)
ALT: 49 U/L (ref 0–55)
ANION GAP: 9 (ref 3–11)
AST: 89 U/L — ABNORMAL HIGH (ref 5–34)
Alkaline Phosphatase: 51 U/L (ref 40–150)
BUN: 22 mg/dL (ref 7–26)
CALCIUM: 8.8 mg/dL (ref 8.4–10.4)
CHLORIDE: 106 mmol/L (ref 98–109)
CO2: 25 mmol/L (ref 22–29)
Creatinine, Ser: 0.86 mg/dL (ref 0.70–1.30)
GFR calc non Af Amer: >60 mL/min (ref >60)
Glucose, Bld: 83 mg/dL (ref 70–140)
POTASSIUM: 3.8 mmol/L (ref 3.5–5.1)
SODIUM: 140 mmol/L (ref 136–145)
Total Bilirubin: 2.5 mg/dL — ABNORMAL HIGH (ref 0.2–1.2)
Total Protein: 6.5 g/dL (ref 6.4–8.3)

## 2017-08-17 LAB — RETICULOCYTES
RBC.: 3.18 MIL/uL — ABNORMAL LOW (ref 4.20–5.82)
RETIC COUNT ABSOLUTE: 279.8 10*3/uL — AB (ref 34.8–93.9)
Retic Ct Pct: 8.8 % — ABNORMAL HIGH (ref 0.8–1.8)

## 2017-08-17 LAB — LACTATE DEHYDROGENASE: LDH: 2946 U/L — AB (ref 125–245)

## 2017-08-17 MED ORDER — METOPROLOL TARTRATE 50 MG PO TABS: 50.0000 mg | ORAL_TABLET | Freq: Two times a day (BID) | ORAL | 6 refills | Status: DC

## 2017-08-17 NOTE — Addendum Note (Signed)
Addended by: Lowella Dell on: 08/17/2017 08:44 AM   Modules accepted: Orders

## 2017-08-17 NOTE — Progress Notes (Unsigned)
Runs hemoglobin today is 9.2.  Even though he was transfused 5 days ago, that is still encouraging.  I listen to his heart murmur which appears to me a little bit lower than before.  We discussed getting a transesophageal echo.  We are going to hold off until after I get the results of his Friday and next Tuesdays labs.  His PNH studies still pending.

## 2017-08-18 LAB — HAPTOGLOBIN: Haptoglobin: <10 mg/dL — ABNORMAL LOW (ref 34–200)

## 2017-08-19 LAB — PNH PROFILE (-HIGH SENSITIVITY): Viability:: 82

## 2017-08-20 ENCOUNTER — Other Ambulatory Visit: Payer: Self-pay | Admitting: Oncology

## 2017-08-20 ENCOUNTER — Other Ambulatory Visit: Payer: Self-pay | Admitting: *Deleted

## 2017-08-20 ENCOUNTER — Inpatient Hospital Stay: Payer: Commercial Managed Care - PPO

## 2017-08-20 DIAGNOSIS — D591 Other autoimmune hemolytic anemias: Principal | ICD-10-CM

## 2017-08-20 DIAGNOSIS — K2100 Gastro-esophageal reflux disease with esophagitis, without bleeding: Secondary | ICD-10-CM

## 2017-08-20 DIAGNOSIS — M329 Systemic lupus erythematosus, unspecified: Secondary | ICD-10-CM | POA: Diagnosis not present

## 2017-08-20 DIAGNOSIS — D649 Anemia, unspecified: Secondary | ICD-10-CM

## 2017-08-20 DIAGNOSIS — D500 Iron deficiency anemia secondary to blood loss (chronic): Secondary | ICD-10-CM

## 2017-08-20 DIAGNOSIS — R059 Cough, unspecified: Secondary | ICD-10-CM

## 2017-08-20 DIAGNOSIS — D5919 Other autoimmune hemolytic anemia: Secondary | ICD-10-CM

## 2017-08-20 DIAGNOSIS — R05 Cough: Secondary | ICD-10-CM

## 2017-08-20 DIAGNOSIS — I350 Nonrheumatic aortic (valve) stenosis: Secondary | ICD-10-CM

## 2017-08-20 DIAGNOSIS — E785 Hyperlipidemia, unspecified: Secondary | ICD-10-CM

## 2017-08-20 DIAGNOSIS — D590 Drug-induced autoimmune hemolytic anemia: Secondary | ICD-10-CM

## 2017-08-20 DIAGNOSIS — Q231 Congenital insufficiency of aortic valve: Secondary | ICD-10-CM

## 2017-08-20 DIAGNOSIS — D594 Other nonautoimmune hemolytic anemias: Secondary | ICD-10-CM

## 2017-08-20 DIAGNOSIS — D582 Other hemoglobinopathies: Secondary | ICD-10-CM

## 2017-08-20 DIAGNOSIS — Z952 Presence of prosthetic heart valve: Secondary | ICD-10-CM

## 2017-08-20 DIAGNOSIS — L930 Discoid lupus erythematosus: Secondary | ICD-10-CM

## 2017-08-20 LAB — COMPREHENSIVE METABOLIC PANEL
ALK PHOS: 46 U/L (ref 40–150)
ALT: 44 U/L (ref 0–55)
AST: 72 U/L — AB (ref 5–34)
Albumin: 3.4 g/dL — ABNORMAL LOW (ref 3.5–5.0)
Anion gap: 8 (ref 3–11)
BILIRUBIN TOTAL: 1.9 mg/dL — AB (ref 0.2–1.2)
BUN: 22 mg/dL (ref 7–26)
CALCIUM: 8.8 mg/dL (ref 8.4–10.4)
CO2: 28 mmol/L (ref 22–29)
CREATININE: 0.79 mg/dL (ref 0.70–1.30)
Chloride: 104 mmol/L (ref 98–109)
Glucose, Bld: 118 mg/dL (ref 70–140)
Potassium: 3.4 mmol/L — ABNORMAL LOW (ref 3.5–5.1)
Sodium: 140 mmol/L (ref 136–145)
TOTAL PROTEIN: 6.2 g/dL — AB (ref 6.4–8.3)

## 2017-08-20 LAB — TYPE AND SCREEN
ABO/RH(D): O POS
ANTIBODY SCREEN: NEGATIVE

## 2017-08-20 LAB — CBC WITH DIFFERENTIAL/PLATELET
BASOS PCT: 0 %
Basophils Absolute: 0 10*3/uL (ref 0.0–0.1)
EOS ABS: 0.1 10*3/uL (ref 0.0–0.5)
Eosinophils Relative: 1 %
HCT: 28.2 % — ABNORMAL LOW (ref 38.4–49.9)
HEMOGLOBIN: 8.6 g/dL — AB (ref 13.0–17.1)
Lymphocytes Relative: 20 %
Lymphs Abs: 3 10*3/uL (ref 0.9–3.3)
MCH: 29.4 pg (ref 27.2–33.4)
MCHC: 30.5 g/dL — AB (ref 32.0–36.0)
MCV: 96.2 fL (ref 79.3–98.0)
MONOS PCT: 5 %
Monocytes Absolute: 0.8 10*3/uL (ref 0.1–0.9)
NEUTROS PCT: 74 %
Neutro Abs: 10.9 10*3/uL — ABNORMAL HIGH (ref 1.5–6.5)
PLATELETS: 109 10*3/uL — AB (ref 140–400)
RBC: 2.93 MIL/uL — ABNORMAL LOW (ref 4.20–5.82)
RDW: 27.9 % — ABNORMAL HIGH (ref 11.0–15.6)
WBC: 14.8 10*3/uL — ABNORMAL HIGH (ref 4.0–10.3)

## 2017-08-20 LAB — RETICULOCYTES
RBC.: 2.93 MIL/uL — AB (ref 4.20–5.82)
Retic Count, Absolute: 331.1 10*3/uL — ABNORMAL HIGH (ref 34.8–93.9)
Retic Ct Pct: 11.3 % — ABNORMAL HIGH (ref 0.8–1.8)

## 2017-08-20 LAB — LACTATE DEHYDROGENASE: LDH: 2581 U/L — ABNORMAL HIGH (ref 125–245)

## 2017-08-20 NOTE — Progress Notes (Unsigned)
Runs labs today show a hemoglobin of 8.6.  He has a reticulocyte of 331, which is remarkable.  Clearly he is continuing to hemolyze as before.  The PNH test has come back but I am having trouble interpreting it as it looks positive to me but it is read as negative.  I have a call out to the interpreting physician at the outside lab.  If the PNH is truly negative I am setting him up for a TEE next week.  Luis Ferguson is coming back on Tuesday.  I will review all this with him at that time.

## 2017-08-23 ENCOUNTER — Other Ambulatory Visit: Payer: Self-pay | Admitting: Oncology

## 2017-08-24 ENCOUNTER — Inpatient Hospital Stay: Payer: Commercial Managed Care - PPO

## 2017-08-24 ENCOUNTER — Other Ambulatory Visit: Payer: Self-pay | Admitting: Oncology

## 2017-08-24 DIAGNOSIS — Z952 Presence of prosthetic heart valve: Secondary | ICD-10-CM

## 2017-08-24 DIAGNOSIS — D594 Other nonautoimmune hemolytic anemias: Secondary | ICD-10-CM

## 2017-08-24 DIAGNOSIS — R059 Cough, unspecified: Secondary | ICD-10-CM

## 2017-08-24 DIAGNOSIS — D591 Other autoimmune hemolytic anemias: Secondary | ICD-10-CM | POA: Diagnosis not present

## 2017-08-24 DIAGNOSIS — Q231 Congenital insufficiency of aortic valve: Secondary | ICD-10-CM

## 2017-08-24 DIAGNOSIS — I350 Nonrheumatic aortic (valve) stenosis: Secondary | ICD-10-CM

## 2017-08-24 DIAGNOSIS — R05 Cough: Secondary | ICD-10-CM

## 2017-08-24 DIAGNOSIS — D649 Anemia, unspecified: Secondary | ICD-10-CM

## 2017-08-24 DIAGNOSIS — K2100 Gastro-esophageal reflux disease with esophagitis, without bleeding: Secondary | ICD-10-CM

## 2017-08-24 DIAGNOSIS — L930 Discoid lupus erythematosus: Secondary | ICD-10-CM

## 2017-08-24 DIAGNOSIS — D590 Drug-induced autoimmune hemolytic anemia: Secondary | ICD-10-CM

## 2017-08-24 DIAGNOSIS — E785 Hyperlipidemia, unspecified: Secondary | ICD-10-CM

## 2017-08-24 DIAGNOSIS — D500 Iron deficiency anemia secondary to blood loss (chronic): Secondary | ICD-10-CM

## 2017-08-24 LAB — RETICULOCYTES
RBC.: 2.84 MIL/uL — ABNORMAL LOW (ref 4.20–5.82)
Retic Count, Absolute: 340.8 10*3/uL — ABNORMAL HIGH (ref 34.8–93.9)
Retic Ct Pct: 12 % — ABNORMAL HIGH (ref 0.8–1.8)

## 2017-08-24 LAB — COMPREHENSIVE METABOLIC PANEL
ALBUMIN: 3.6 g/dL (ref 3.5–5.0)
ALT: 44 U/L (ref 0–55)
AST: 75 U/L — AB (ref 5–34)
Alkaline Phosphatase: 46 U/L (ref 40–150)
Anion gap: 9 (ref 3–11)
BUN: 20 mg/dL (ref 7–26)
CO2: 28 mmol/L (ref 22–29)
CREATININE: 0.81 mg/dL (ref 0.70–1.30)
Calcium: 8.6 mg/dL (ref 8.4–10.4)
Chloride: 103 mmol/L (ref 98–109)
GFR calc Af Amer: >60 mL/min (ref >60)
GFR calc non Af Amer: >60 mL/min (ref >60)
GLUCOSE: 98 mg/dL (ref 70–140)
POTASSIUM: 3.7 mmol/L (ref 3.5–5.1)
Sodium: 140 mmol/L (ref 136–145)
Total Bilirubin: 2.1 mg/dL — ABNORMAL HIGH (ref 0.2–1.2)
Total Protein: 6.4 g/dL (ref 6.4–8.3)

## 2017-08-24 LAB — CBC WITH DIFFERENTIAL (CANCER CENTER ONLY)
Basophils Absolute: 0 10*3/uL (ref 0.0–0.1)
Basophils Relative: 0 %
EOS PCT: 1 %
Eosinophils Absolute: 0.1 10*3/uL (ref 0.0–0.5)
HEMATOCRIT: 27.8 % — AB (ref 38.4–49.9)
Hemoglobin: 8.4 g/dL — ABNORMAL LOW (ref 13.0–17.1)
LYMPHS PCT: 24 %
Lymphs Abs: 2.7 10*3/uL (ref 0.9–3.3)
MCH: 29.6 pg (ref 27.2–33.4)
MCHC: 30.2 g/dL — AB (ref 32.0–36.0)
MCV: 97.9 fL (ref 79.3–98.0)
MONO ABS: 0.6 10*3/uL (ref 0.1–0.9)
MONOS PCT: 6 %
NEUTROS ABS: 8 10*3/uL — AB (ref 1.5–6.5)
Neutrophils Relative %: 69 %
PLATELETS: 110 10*3/uL — AB (ref 140–400)
RBC: 2.84 MIL/uL — ABNORMAL LOW (ref 4.20–5.82)
RDW: 28.4 % — AB (ref 11.0–15.6)
WBC Count: 11.5 10*3/uL — ABNORMAL HIGH (ref 4.0–10.3)

## 2017-08-24 LAB — LACTATE DEHYDROGENASE: LDH: 2412 U/L — ABNORMAL HIGH (ref 125–245)

## 2017-08-25 LAB — HAPTOGLOBIN

## 2017-08-26 ENCOUNTER — Other Ambulatory Visit: Payer: Self-pay | Admitting: Oncology

## 2017-08-27 ENCOUNTER — Other Ambulatory Visit: Payer: Self-pay | Admitting: Thoracic Surgery (Cardiothoracic Vascular Surgery)

## 2017-08-27 DIAGNOSIS — Z760 Encounter for issue of repeat prescription: Secondary | ICD-10-CM | POA: Diagnosis not present

## 2017-08-27 DIAGNOSIS — Z952 Presence of prosthetic heart valve: Secondary | ICD-10-CM

## 2017-08-31 ENCOUNTER — Inpatient Hospital Stay: Payer: Commercial Managed Care - PPO | Attending: Adult Health

## 2017-08-31 ENCOUNTER — Telehealth: Payer: Self-pay | Admitting: Oncology

## 2017-08-31 ENCOUNTER — Inpatient Hospital Stay: Payer: Commercial Managed Care - PPO | Admitting: Oncology

## 2017-08-31 ENCOUNTER — Ambulatory Visit: Payer: Commercial Managed Care - PPO | Admitting: Thoracic Surgery (Cardiothoracic Vascular Surgery)

## 2017-08-31 ENCOUNTER — Other Ambulatory Visit: Payer: Self-pay

## 2017-08-31 ENCOUNTER — Encounter: Payer: Self-pay | Admitting: Physician Assistant

## 2017-08-31 ENCOUNTER — Ambulatory Visit (INDEPENDENT_AMBULATORY_CARE_PROVIDER_SITE_OTHER): Payer: Self-pay | Admitting: Physician Assistant

## 2017-08-31 ENCOUNTER — Ambulatory Visit
Admission: RE | Admit: 2017-08-31 | Discharge: 2017-08-31 | Disposition: A | Discharge status: Home / Self Care | Payer: Commercial Managed Care - PPO | Source: Ambulatory Visit | Attending: Thoracic Surgery (Cardiothoracic Vascular Surgery) | Admitting: Thoracic Surgery (Cardiothoracic Vascular Surgery)

## 2017-08-31 VITALS — BP 157/84 | HR 87 | Temp 97.7°F | Resp 18 | Ht 66.0 in | Wt 167.9 lb

## 2017-08-31 VITALS — BP 137/76 | HR 81 | Resp 18 | Ht 66.0 in | Wt 165.8 lb

## 2017-08-31 DIAGNOSIS — R059 Cough, unspecified: Secondary | ICD-10-CM

## 2017-08-31 DIAGNOSIS — L930 Discoid lupus erythematosus: Secondary | ICD-10-CM

## 2017-08-31 DIAGNOSIS — D591 Other autoimmune hemolytic anemias: Secondary | ICD-10-CM | POA: Diagnosis not present

## 2017-08-31 DIAGNOSIS — Z952 Presence of prosthetic heart valve: Secondary | ICD-10-CM | POA: Diagnosis not present

## 2017-08-31 DIAGNOSIS — E785 Hyperlipidemia, unspecified: Secondary | ICD-10-CM

## 2017-08-31 DIAGNOSIS — K2100 Gastro-esophageal reflux disease with esophagitis, without bleeding: Secondary | ICD-10-CM

## 2017-08-31 DIAGNOSIS — M25473 Effusion, unspecified ankle: Secondary | ICD-10-CM | POA: Diagnosis not present

## 2017-08-31 DIAGNOSIS — R05 Cough: Secondary | ICD-10-CM | POA: Diagnosis not present

## 2017-08-31 DIAGNOSIS — D649 Anemia, unspecified: Secondary | ICD-10-CM | POA: Diagnosis not present

## 2017-08-31 DIAGNOSIS — Q231 Congenital insufficiency of aortic valve: Secondary | ICD-10-CM

## 2017-08-31 DIAGNOSIS — D500 Iron deficiency anemia secondary to blood loss (chronic): Secondary | ICD-10-CM

## 2017-08-31 DIAGNOSIS — Z954 Presence of other heart-valve replacement: Secondary | ICD-10-CM | POA: Diagnosis not present

## 2017-08-31 DIAGNOSIS — Q2381 Bicuspid aortic valve: Secondary | ICD-10-CM

## 2017-08-31 DIAGNOSIS — D5919 Other autoimmune hemolytic anemia: Secondary | ICD-10-CM

## 2017-08-31 DIAGNOSIS — I350 Nonrheumatic aortic (valve) stenosis: Secondary | ICD-10-CM

## 2017-08-31 DIAGNOSIS — D590 Drug-induced autoimmune hemolytic anemia: Secondary | ICD-10-CM

## 2017-08-31 DIAGNOSIS — D594 Other nonautoimmune hemolytic anemias: Secondary | ICD-10-CM

## 2017-08-31 DIAGNOSIS — I351 Nonrheumatic aortic (valve) insufficiency: Secondary | ICD-10-CM

## 2017-08-31 LAB — COMPREHENSIVE METABOLIC PANEL
ALT: 48 U/L (ref 0–55)
AST: 83 U/L — ABNORMAL HIGH (ref 5–34)
Albumin: 3.5 g/dL (ref 3.5–5.0)
Alkaline Phosphatase: 41 U/L (ref 40–150)
Anion gap: 9 (ref 3–11)
BILIRUBIN TOTAL: 2 mg/dL — AB (ref 0.2–1.2)
BUN: 17 mg/dL (ref 7–26)
CHLORIDE: 105 mmol/L (ref 98–109)
CO2: 25 mmol/L (ref 22–29)
CREATININE: 0.86 mg/dL (ref 0.70–1.30)
Calcium: 8.6 mg/dL (ref 8.4–10.4)
Glucose, Bld: 164 mg/dL — ABNORMAL HIGH (ref 70–140)
Potassium: 3.8 mmol/L (ref 3.5–5.1)
Sodium: 139 mmol/L (ref 136–145)
Total Protein: 6.1 g/dL — ABNORMAL LOW (ref 6.4–8.3)

## 2017-08-31 LAB — CBC WITH DIFFERENTIAL/PLATELET
Basophils Absolute: 0 10*3/uL (ref 0.0–0.1)
Basophils Relative: 0 %
EOS ABS: 0 10*3/uL (ref 0.0–0.5)
EOS PCT: 0 %
HCT: 27 % — ABNORMAL LOW (ref 38.4–49.9)
HEMOGLOBIN: 8 g/dL — AB (ref 13.0–17.1)
LYMPHS ABS: 0.9 10*3/uL (ref 0.9–3.3)
Lymphocytes Relative: 8 %
MCH: 30.2 pg (ref 27.2–33.4)
MCHC: 29.6 g/dL — ABNORMAL LOW (ref 32.0–36.0)
MCV: 101.9 fL — ABNORMAL HIGH (ref 79.3–98.0)
Monocytes Absolute: 0.5 10*3/uL (ref 0.1–0.9)
Monocytes Relative: 4 %
NEUTROS PCT: 88 %
Neutro Abs: 9.5 10*3/uL — ABNORMAL HIGH (ref 1.5–6.5)
PLATELETS: 112 10*3/uL — AB (ref 140–400)
RBC: 2.65 MIL/uL — AB (ref 4.20–5.82)
RDW: 28.2 % — ABNORMAL HIGH (ref 11.0–14.6)
WBC: 10.9 10*3/uL — AB (ref 4.0–10.3)

## 2017-08-31 LAB — RETICULOCYTES
RBC.: 2.65 MIL/uL — ABNORMAL LOW (ref 4.20–5.82)
RETIC COUNT ABSOLUTE: 304.8 10*3/uL — AB (ref 34.8–93.9)
RETIC CT PCT: 11.5 % — AB (ref 0.8–1.8)

## 2017-08-31 LAB — LACTATE DEHYDROGENASE: LDH: 2339 U/L — AB (ref 125–245)

## 2017-08-31 MED ORDER — PREDNISONE 20 MG PO TABS: 50.0000 mg | ORAL_TABLET | Freq: Every day | ORAL | 1 refills | Status: DC

## 2017-08-31 NOTE — Progress Notes (Signed)
[facility]  [phone] [phone]     ID: Luis Ferguson DOB: Mar 31, 1961  MR#: [id number]  JYN#:[id number]  Patient Care Team: Cleda Mccreedy as PCP - General (Nurse Practitioner) Pixie Casino, MD as PCP - Cardiology (Cardiology) Valinda Party, MD (Rheumatology) Melrose Nakayama, MD as Consulting Physician (Cardiothoracic Surgery) Soijett A Blue OTHER MD:  CHIEF COMPLAINT: Hemolytic anemia  CURRENT TREATMENT: Prednisone, plaquenil  INTERVAL HISTORY: Luis Ferguson is here today for follow up of his anemia. He reports that he is doing well overall.  He has not needed a blood transfusion now for about 3 weeks although his hemoglobin continues to drift down slowly.  He denies any chest pressure, fatigue, or shortness of breath.      REVIEW OF SYSTEMS: Luis Ferguson reports that he is back at his baseline weight following surgery. BLE swelling, which he contributes to prednisone use. He reports increased appetite due to prednisone use. He notes that his mouth with "funny" sensation in the morning. He reports that his rheumatologist, wanted him to start on plaquinil, but he wanted a consult first to determine this. He determines how his lupus is doing based on joint pain, which is alleviated. He reports increased energy and notes that he no longer lifts heavy items. He has an appointment with his cardiologist today. He denies unusual headaches, visual changes, nausea, vomiting, or dizziness. There has been no unusual cough, phlegm production, or pleurisy. This been no change in bowel or bladder habits. He denies unexplained fatigue or unexplained weight loss, bleeding, rash, or fever. A detailed review of systems was otherwise stable.       HISTORY OF CURRENT ILLNESS: Luis Ferguson has a history of lupus which is managed I believe by Dr. Estanislado Pandy (the patient was not certain of the name today).  He has been on Plaquenil and tells me his lupus has been very  stable.  On 07/12/2017 he underwent valve replacement for severe aortic stenosis.  This was an EIE valve, which is a tissue valve.  He did generally well with the surgery and note that his hemoglobin preop was 12.5 (on 07/09/2017).  At the time of discharge from that procedure his hemoglobin remained low and on 07/19/2017 he was transfused 2 units of packed red cells.  He then followed up with cardiology on 07/30/2017 and at that time his hemoglobin was down to 6.2.  White cell count was 5.2 and platelets 267,000.  The MCV was essentially unchanged at 86.  The patient was eventually alerted to these results and asked to present for admission.  This happened yesterday, 08/05/2017, and admission workup included, in addition to the anemia findings, an LDH of 1647, a reticulocyte count of greater than 200, and normal creatinine, INR, and a PTT.  B12, folate, and ferritin were normal. The DIC panel did show schistocytes.  DAT was negative for IgG and complement.  Haptoglobin was <10  [name] subsequent history is as detailed below.    PAST MEDICAL HISTORY: Past Medical History:  Diagnosis Date  . Aortic stenosis   . Arthritis    achiness in shoulder , elbows  . Connective tissue disease ([place])    followed by Dr. Dossie Der, GMA, pt. off Prednisone since mid 2017  . Fatigue   . Psoriasis    below the knee on both legs     PAST SURGICAL HISTORY: Past Surgical History:  Procedure Laterality Date  . CARDIAC CATHETERIZATION    . HERNIA REPAIR  2016   left and right  . RIGHT/LEFT HEART CATH AND CORONARY ANGIOGRAPHY N/A 06/16/2017   Procedure: RIGHT/LEFT HEART CATH AND CORONARY ANGIOGRAPHY;  Surgeon: Belva Crome, MD;  Location: [facility];  Service: Cardiovascular;  Laterality: N/A;  . TEE WITHOUT CARDIOVERSION N/A 07/12/2017   Procedure: TRANSESOPHAGEAL ECHOCARDIOGRAM (TEE);  Surgeon: Melrose Nakayama, MD;  Location: [facility];  Service: Open Heart Surgery;  Laterality: N/A;     FAMILY HISTORY Family History  Problem Relation Age of Onset  . Diabetes Mother        also HTN  . CAD Father 57       CABGx3  . Liver cancer Brother   . Diabetes Brother    SOCIAL HISTORY:  He works for Product/process development scientist, cutting metal.  He denies any significant exposure at work.  His wife [name] works as an Optometrist.  They have no children.  They do have 2 dogs.  They belong to a local [employer] in church          ADVANCED DIRECTIVES:    HEALTH MAINTENANCE: Social History   Tobacco Use  . Smoking status: Never Smoker  . Smokeless tobacco: Never Used  Substance Use Topics  . Alcohol use: No  . Drug use: No     Colonoscopy:  PSA:  Bone density:   Allergies  Allergen Reactions  . Ace Inhibitors Cough  . Codeine Itching and Rash    Current Outpatient Medications  Medication Sig Dispense Refill  . aspirin 81 MG tablet Take 81 mg by mouth daily.    [name] atorvastatin (LIPITOR) 20 MG tablet Take 20 mg by mouth daily.     . calcipotriene (DOVONOX) 0.005 % cream Apply 1 application topically 2 (two) times daily as needed.     . calcium carbonate (TUMS EX) 750 MG chewable tablet Chew 1 tablet by mouth daily as needed for heartburn.    . cholecalciferol (VITAMIN D) 1000 UNITS tablet Take 1,000 Units by mouth daily.    . clobetasol ointment (TEMOVATE) 5.64 % Apply 1 application topically 2 (two) times daily as needed.     . ferrous sulfate 325 (65 FE) MG EC tablet Take 1 tablet (325 mg total) by mouth 2 (two) times daily with a meal. 332 tablet 0  . folic acid (FOLVITE) 1 MG tablet Take 1 tablet (1 mg total) by mouth daily. 30 tablet 0  . hydroxychloroquine (PLAQUENIL) 200 MG tablet Take 400 mg by mouth daily.     . metoprolol tartrate (LOPRESSOR) 50 MG tablet Take 1 tablet (50 mg total) by mouth 2 (two) times daily. 60 tablet 6  . pantoprazole (PROTONIX) 40 MG tablet Take 1 tablet (40 mg total) by mouth daily. 90 tablet 0  . predniSONE (DELTASONE) 20 MG tablet Take  3 tablets (60 mg total) by mouth daily with breakfast. 90 tablet 1   No current facility-administered medications for this visit.     OBJECTIVE: Middle-aged white man who appears stated age  51:   08/31/17 0850  BP: (!) 157/84  Pulse: 87  Resp: 18  Temp: 97.7 F (36.5 C)  SpO2: 98%     Body mass index is 27.1 kg/m.   Wt Readings from Last 3 Encounters:  08/31/17 167 lb 14.4 oz (76.2 kg)  08/10/17 152 lb 3.2 oz (69 kg)  08/06/17 159 lb 9.8 oz (72.4 kg)      ECOG FS:1 - Symptomatic but completely ambulatory  Sclerae unicteric, pupils round  and equal Oropharynx clear and moist No cervical or supraclavicular adenopathy Lungs no rales or rhonchi Heart regular rate and rhythm, 3/6 systolic murmur Abd soft, nontender, positive bowel sounds MSK no focal spinal tenderness, no upper extremity lymphedema Neuro: nonfocal, well oriented, appropriate affect   LAB RESULTS:  CMP     Component Value Date/Time   NA 140 08/24/2017 0818   NA 142 06/15/2017 1603   K 3.7 08/24/2017 0818   CL 103 08/24/2017 0818   CO2 28 08/24/2017 0818   GLUCOSE 98 08/24/2017 0818   BUN 20 08/24/2017 0818   BUN 12 06/15/2017 1603   CREATININE 0.81 08/24/2017 0818   CALCIUM 8.6 08/24/2017 0818   PROT 6.4 08/24/2017 0818   ALBUMIN 3.6 08/24/2017 0818   AST 75 (H) 08/24/2017 0818   ALT 44 08/24/2017 0818   ALKPHOS 46 08/24/2017 0818   BILITOT 2.1 (H) 08/24/2017 0818   GFRNONAA >60 08/24/2017 0818   GFRAA >60 08/24/2017 0818    No results found for: TOTALPROTELP, ALBUMINELP, A1GS, A2GS, BETS, BETA2SER, GAMS, MSPIKE, SPEI  No results found for: KPAFRELGTCHN, LAMBDASER, KAPLAMBRATIO  Lab Results  Component Value Date   WBC 11.5 (H) 08/24/2017   NEUTROABS 8.0 (H) 08/24/2017   HGB 8.6 (L) 08/20/2017   HCT 27.8 (L) 08/24/2017   MCV 97.9 08/24/2017   PLT 110 (L) 08/24/2017      Chemistry      Component Value Date/Time   NA 140 08/24/2017 0818   NA 142 06/15/2017 1603   K 3.7  08/24/2017 0818   CL 103 08/24/2017 0818   CO2 28 08/24/2017 0818   BUN 20 08/24/2017 0818   BUN 12 06/15/2017 1603   CREATININE 0.81 08/24/2017 0818      Component Value Date/Time   CALCIUM 8.6 08/24/2017 0818   ALKPHOS 46 08/24/2017 0818   AST 75 (H) 08/24/2017 0818   ALT 44 08/24/2017 0818   BILITOT 2.1 (H) 08/24/2017 0818       No results found for: LABCA2  No components found for: [id number]  No results for input(s): INR in the last 168 hours.  No results found for: LABCA2  No results found for: OVZ858  No results found for: IFO277  No results found for: AJO878  No results found for: CA2729  No components found for: HGQUANT  No results found for: CEA1 / No results found for: CEA1   No results found for: AFPTUMOR  No results found for: CHROMOGRNA  No results found for: PSA1  No visits with results within 3 Day(s) from this visit.  Latest known visit with results is:  Appointment on 08/24/2017  Component Date Value Ref Range Status  . Sodium 08/24/2017 140  136 - 145 mmol/L Final  . Potassium 08/24/2017 3.7  3.5 - 5.1 mmol/L Final  . Chloride 08/24/2017 103  98 - 109 mmol/L Final  . CO2 08/24/2017 28  22 - 29 mmol/L Final  . Glucose, Bld 08/24/2017 98  70 - 140 mg/dL Final  . BUN 08/24/2017 20  7 - 26 mg/dL Final  . Creatinine, Ser 08/24/2017 0.81  0.70 - 1.30 mg/dL Final  . Calcium 08/24/2017 8.6  8.4 - 10.4 mg/dL Final  . Total Protein 08/24/2017 6.4  6.4 - 8.3 g/dL Final  . Albumin 08/24/2017 3.6  3.5 - 5.0 g/dL Final  . AST 08/24/2017 75* 5 - 34 U/L Final  . ALT 08/24/2017 44  0 - 55 U/L Final  . Alkaline Phosphatase 08/24/2017  46  40 - 150 U/L Final  . Total Bilirubin 08/24/2017 2.1* 0.2 - 1.2 mg/dL Final  . GFR calc non Af Amer 08/24/2017 >60  >60 mL/min Final  . GFR calc Af Amer 08/24/2017 >60  >60 mL/min Final   Comment: (NOTE) The eGFR has been calculated using the CKD EPI equation. This calculation has not been validated in all clinical  situations. eGFR's persistently <60 mL/min signify possible Chronic Kidney Disease.   Georgiann Hahn gap 08/24/2017 9  3 - 11 Final   Performed at [facility], [place] [address] [place], [place] [address]  . Haptoglobin 08/24/2017 <10* 34 - 200 mg/dL Final   Comment: (NOTE) Performed At: [facility] [place], [place] [id number] Rush Farmer MD IA:[id number] Performed at [facility], [place] [address], [place], [place] [address]   . LDH 08/24/2017 2,412* 125 - 245 U/L Final   Performed at [facility], [place] [address], [place], [place] [address]  . WBC Count 08/24/2017 11.5* 4.0 - 10.3 K/uL Final  . RBC 08/24/2017 2.84* 4.20 - 5.82 MIL/uL Final  . Hemoglobin 08/24/2017 8.4* 13.0 - 17.1 g/dL Final  . HCT 08/24/2017 27.8* 38.4 - 49.9 % Final  . MCV 08/24/2017 97.9  79.3 - 98.0 fL Final  . MCH 08/24/2017 29.6  27.2 - 33.4 pg Final  . MCHC 08/24/2017 30.2* 32.0 - 36.0 g/dL Final  . RDW 08/24/2017 28.4* 11.0 - 15.6 % Final  . Platelet Count 08/24/2017 110* 140 - 400 K/uL Final  . Neutrophils Relative % 08/24/2017 69  % Final  . Neutro Abs 08/24/2017 8.0* 1.5 - 6.5 K/uL Final  . Lymphocytes Relative 08/24/2017 24  % Final  . Lymphs Abs 08/24/2017 2.7  0.9 - 3.3 K/uL Final  . Monocytes Relative 08/24/2017 6  % Final  . Monocytes Absolute 08/24/2017 0.6  0.1 - 0.9 K/uL Final  . Eosinophils Relative 08/24/2017 1  % Final  . Eosinophils Absolute 08/24/2017 0.1  0.0 - 0.5 K/uL Final  . Basophils Relative 08/24/2017 0  % Final  . Basophils Absolute 08/24/2017 0.0  0.0 - 0.1 K/uL Final   Performed at [facility], [place] [address], [place], [place] [address]  . Retic Ct Pct 08/24/2017 12.0* 0.8 - 1.8 % Final  . RBC. 08/24/2017 2.84* 4.20 - 5.82 MIL/uL Final  . Retic Count, Absolute 08/24/2017 340.8* 34.8 - 93.9 K/uL Final   Performed at [facility], [place]  [address], [place], [place] [address]    (this displays the last labs from the last 3 days)  No results found for: TOTALPROTELP, ALBUMINELP, A1GS, A2GS, BETS, BETA2SER, GAMS, MSPIKE, SPEI (this displays SPEP labs)  No results found for: KPAFRELGTCHN, LAMBDASER, KAPLAMBRATIO (kappa/lambda light chains)  No results found for: HGBA, HGBA2QUANT, HGBFQUANT, HGBSQUAN (Hemoglobinopathy evaluation)   Lab Results  Component Value Date   LDH 2,412 (H) 08/24/2017    Lab Results  Component Value Date   IRON 53 08/06/2017   TIBC 190 (L) 08/06/2017   IRONPCTSAT 28 08/06/2017   (Iron and TIBC)  Lab Results  Component Value Date   FERRITIN 374 (H) 08/06/2017    Urinalysis    Component Value Date/Time   COLORURINE BROWN (A) 08/05/2017 [place] 08/05/2017 1435   LABSPEC 1.025 08/05/2017 1435   PHURINE 5.5 08/05/2017 1435   GLUCOSEU NEGATIVE 08/05/2017 1435   HGBUR LARGE (A) 08/05/2017 1435  BILIRUBINUR MODERATE (A) 08/05/2017 1435   KETONESUR NEGATIVE 08/05/2017 1435   PROTEINUR 100 (A) 08/05/2017 1435   NITRITE NEGATIVE 08/05/2017 1435   LEUKOCYTESUR NEGATIVE 08/05/2017 1435     STUDIES: Dg Chest Port 1 View  Result Date: 08/05/2017 CLINICAL DATA:  Anemia and recent aortic valve replacement EXAM: PORTABLE CHEST 1 VIEW COMPARISON:  07/21/2017 FINDINGS: Stable appearance of mini thoracotomy fixation plate to the right of midline and prosthetic aortic valve. Lungs show mild bibasilar atelectasis. There is no evidence of pulmonary edema, consolidation, pneumothorax, nodule or pleural fluid. IMPRESSION: Mild bibasilar atelectasis.  No acute findings. Electronically Signed   By: Aletta Edouard M.D.   On: 08/05/2017 21:02    ELIGIBLE FOR AVAILABLE RESEARCH PROTOCOL: no  ASSESSMENT: 57 y.o. 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  There has been no obvious response.  He continues to have a very low haptoglobin, a very high reticulocyte count, and his hemoglobin continues to drift down.  We have done a very extensive workup for his hemolysis and I have come up empty handed.  I really think we may be dealing with a valvular problem and he is scheduled to see Dr. Roxan Hockey today and Dr. Debara Pickett next week to evaluate that further.  At this point I am going to start decreasing his prednisone.  He will start 50 mg tomorrow and then hopefully will go to 40 mg next week after we recheck his labs.  At the same time we are resuming his Plaquenil.  He will see me again towards the end of March.  I hope to be able to release him from follow-up at that point assuming, as I expect, we may be dealing with a correctable valvular issue.  The mild ankle swelling is going to be due to the prednisone.  That should resolve as we start to decrease doses.  Luis Ferguson knows to call for any problems that may develop before the next visit.   Lurline Del MD  08/31/2017 9:05 AM Medical Oncology and Hematology [facility] [address] [place], [place] [address] Tel. [phone]    Fax.  [phone]   This document serves as a record of services personally performed by Lurline Del, MD. It was created on his behalf by Steva Colder, a trained medical scribe. The creation of this record is based on the scribe's personal observations and the provider's statements to them.   I have reviewed the above documentation for accuracy and completeness, and I agree with the above.

## 2017-08-31 NOTE — Telephone Encounter (Signed)
Gave patient avs and calendar with appts per 2/5 los °

## 2017-08-31 NOTE — Progress Notes (Signed)
  HPI:  Patient returns for routine postoperative follow-up having undergone a right mini thoracotomy for aortic valve replacement on 07/12/2018 by Dr. Dorris Fetch. He was recently admitted on 01/10 thru 01/12 for anemia. FOB was negative. He was given 2 units of PRBC. He was put on Prednisone which is being tapered. He was also put back on Plaquenil. He has had 2 follow up appointments with Hematology since then. A thorough work up has been to find the etiology of his hemolytic anemia. Dr. Darnelle Catalan thinks this might be a valvular problem. Dr. Darnelle Catalan also thinks his ankle, lower leg swelling is secondary to Prednisone. Per patient, he is feeling fairly well. He denies chest pain, orthopnea, or PND. He only has shortness of breath with very strenuous activity.  Current Outpatient Medications  Medication Sig Dispense Refill  . aspirin 81 MG tablet Take 81 mg by mouth daily.    [name] atorvastatin (LIPITOR) 20 MG tablet Take 20 mg by mouth daily.     . calcipotriene (DOVONOX) 0.005 % cream Apply 1 application topically 2 (two) times daily as needed.     . cholecalciferol (VITAMIN D) 1000 UNITS tablet Take 1,000 Units by mouth daily.    . clobetasol ointment (TEMOVATE) 0.05 % Apply 1 application topically 2 (two) times daily as needed.     . folic acid (FOLVITE) 1 MG tablet Take 1 tablet (1 mg total) by mouth daily. 30 tablet 0  . hydroxychloroquine (PLAQUENIL) 200 MG tablet Take 400 mg by mouth daily.     . metoprolol tartrate (LOPRESSOR) 50 MG tablet Take 1 tablet (50 mg total) by mouth 2 (two) times daily. 60 tablet 6  . pantoprazole (PROTONIX) 40 MG tablet Take 1 tablet (40 mg total) by mouth daily. 90 tablet 0  . predniSONE (DELTASONE) 20 MG tablet Take 2.5 tablets (50 mg total) by mouth daily with breakfast. 90 tablet 1  Vital Signs: BP 137/76, HR 81, RR 18, Oxygenation 98% on room air   Physical Exam: CV-RRR,  Grade II-III/VI systolic murmur Pulmonary-Clear to auscultation  bilaterally Extremities-+ LE edema Wound-Clean and dry. Eschar removed from chest tube site.  Diagnostic Tests: EXAM: CHEST  2 VIEW  COMPARISON:  Radiograph August 05, 2017.  FINDINGS: The heart size and mediastinal contours are within normal limits. Both lungs are clear. No pneumothorax pleural effusion is noted. Aortic valve prosthesis is noted. Stable postsurgical changes are seen involving the sternum.  IMPRESSION: No active cardiopulmonary disease.   Electronically Signed   By: Lupita Raider, M.D.   On: 08/31/2017 14:15  Impression and Plan: Overall, Luis Ferguson is continuing to recover from mini aortic valve replacement. Of concern, is the etiology of the hemolytic anemia. His last echo was done on 08/06/2017. LVEF 60-65%, mild perivalvular regurgitation but no stenosis and trivial MR. I will discuss with Dr. Dorris Fetch as to when he will be able to evaluate the patient to answer all of their questions. I told patient and his wife I will call them tomorrow after I speak with Dr. Dorris Fetch.  Luis Balls, PA-C [facility] [phone]

## 2017-09-01 ENCOUNTER — Other Ambulatory Visit: Payer: Self-pay | Admitting: Oncology

## 2017-09-01 LAB — HAPTOGLOBIN

## 2017-09-02 ENCOUNTER — Other Ambulatory Visit: Payer: Self-pay | Admitting: Oncology

## 2017-09-03 ENCOUNTER — Ambulatory Visit (INDEPENDENT_AMBULATORY_CARE_PROVIDER_SITE_OTHER): Payer: Self-pay | Admitting: Thoracic Surgery (Cardiothoracic Vascular Surgery)

## 2017-09-03 VITALS — BP 150/80 | HR 73 | Resp 20 | Ht 66.0 in | Wt 167.0 lb

## 2017-09-03 DIAGNOSIS — Z952 Presence of prosthetic heart valve: Secondary | ICD-10-CM

## 2017-09-03 DIAGNOSIS — I351 Nonrheumatic aortic (valve) insufficiency: Secondary | ICD-10-CM

## 2017-09-03 DIAGNOSIS — I350 Nonrheumatic aortic (valve) stenosis: Secondary | ICD-10-CM

## 2017-09-03 MED ORDER — POTASSIUM CHLORIDE CRYS ER 20 MEQ PO TBCR: EXTENDED_RELEASE_TABLET | ORAL | 1 refills | Status: DC

## 2017-09-03 MED ORDER — FUROSEMIDE 20 MG PO TABS: 20.0000 mg | ORAL_TABLET | Freq: Every day | ORAL | 1 refills | Status: DC

## 2017-09-04 ENCOUNTER — Other Ambulatory Visit: Payer: Self-pay | Admitting: Oncology

## 2017-09-04 NOTE — Progress Notes (Signed)
[address]       [name] [address]             [phone]       HPI: Mr. Walsh returns for a scheduled postoperative follow-up visit.  Mr. Dills is a 57 year old man with a history of lupus and dyslipidemia.  He has been followed for aortic stenosis and insufficiency.  He developed decreased energy and fatigue along with swelling in his legs of breath with exertion and dizzy spells.  Cardiography showed severe aortic stenosis with a mean gradient of 47 mmHg.  He was referred for aortic valve replacement.  I did an aortic valve replacement with an [employer] bioprosthetic valve on 07/12/2017.  We did this through her right mini thoracotomy approach.  Initially after weaning from bypass and transesophageal echocardiography revealed good function of the prosthetic valve.  After closure a small paravalvular leak was noted on echo.  His postoperative course was uncomplicated went home on day 6.  He has had minimal postoperative discomfort and is not requiring narcotics.  Unfortunately presented back to the emergency room about a month ago with general fatigue and weakness.  He was found to be severely anemic and ultimately required blood transfusion.  He was diagnosed with hemolytic anemia.  He was started on high-dose prednisone at 60 mg daily.  He noticed swelling in his feet and ankles with that medication.  He had an extensive workup which showed no immunologic cause for the anemia.  An echocardiogram showed a persistent small perivalvular leak.  Despite that he says today that he feels well.  He does still have swelling in his legs.  He does not have any shortness of breath orthopnea.  He has not returned to his previous size tolerance.  Past Medical History:  Diagnosis Date  . Aortic stenosis   . Arthritis    achiness in shoulder , elbows  . Connective tissue disease (HCC)    followed by Dr. Kathi Ludwig, GMA, pt. off Prednisone since mid 2017  . Fatigue   .  Psoriasis    below the knee on both legs     Current Outpatient Medications  Medication Sig Dispense Refill  . aspirin 81 MG tablet Take 81 mg by mouth daily.    [name] atorvastatin (LIPITOR) 20 MG tablet Take 20 mg by mouth daily.     . calcipotriene (DOVONOX) 0.005 % cream Apply 1 application topically 2 (two) times daily as needed.     . cholecalciferol (VITAMIN D) 1000 UNITS tablet Take 1,000 Units by mouth daily.    . clobetasol ointment (TEMOVATE) 0.05 % Apply 1 application topically 2 (two) times daily as needed.     . folic acid (FOLVITE) 1 MG tablet Take 1 tablet (1 mg total) by mouth daily. 30 tablet 0  . hydroxychloroquine (PLAQUENIL) 200 MG tablet Take 400 mg by mouth daily.     . metoprolol tartrate (LOPRESSOR) 50 MG tablet Take 1 tablet (50 mg total) by mouth 2 (two) times daily. 60 tablet 6  . pantoprazole (PROTONIX) 40 MG tablet Take 1 tablet (40 mg total) by mouth daily. 90 tablet 0  . predniSONE (DELTASONE) 20 MG tablet Take 2.5 tablets (50 mg total) by mouth daily with breakfast. 90 tablet 1  . tamsulosin (FLOMAX) 0.4 MG CAPS capsule Take 0.4 mg by mouth.    . furosemide (LASIX) 20 MG tablet Take 1 tablet (20 mg total) by mouth daily. 14 tablet 1  .  potassium chloride (K-DUR,KLOR-CON) 20 MEQ tablet One tablet by  Mouth daily 14 tablet 1   No current facility-administered medications for this visit.     Physical Exam BP (!) 150/80   Pulse 73   Resp 20   Ht 5\' 6"  (1.676 m)   Wt 167 lb (75.8 kg)   SpO2 98% Comment: RA  BMI 26.58 kg/m  57 year old man in no acute distress Alert and oriented x3 with no focal deficits Cardiac regular rate and rhythm with a systolic murmur Lungs clear with equal breath sounds bilaterally Incision well-healed Extremities with 3+ pitting edema in both lower extremities from mid calf down  Diagnostic Tests: CHEST  2 VIEW  COMPARISON:  Radiograph August 05, 2017.  FINDINGS: The heart size and mediastinal contours are within normal  limits. Both lungs are clear. No pneumothorax pleural effusion is noted. Aortic valve prosthesis is noted. Stable postsurgical changes are seen involving the sternum.  IMPRESSION: No active cardiopulmonary disease.   Electronically Signed   By: Lupita Raider, M.D.   On: 08/31/2017 14:15 I personally reviewed the chest x-ray images and concur with the findings noted above  Impression: Mr. Kenyon is a 57 year old gentleman who underwent a right mini thoracotomy for aortic valve replacement about 6 weeks ago.  His initial postoperative course was uncomplicated but he presented back with hemolytic anemia.  He had a small paravalvular leak at the time of surgery and that has persisted on repeat echocardiogram.  He required transfusions initially but his hemoglobin is been relatively stable although his LDH is persistently elevated on laboratory testing.  He has significant peripheral edema.  He attributed this to prednisone.  I am concerned there could be a heart failure component although he does not show any other signs of heart failure.  He currently is weaning down on the prednisone dose.  I had a long discussion with Mr. and Mrs. Decelles.  They understand the paravalvular leaks occurring about 2-5% of aortic valve replacements.  The majority of these will heal on their own without any adverse consequences but 1 of the potential consequences is hemolytic anemia.  He sees Dr. Rennis Golden next week.  I discussed the case with Dr. Rennis Golden and we both think a transesophageal echocardiogram will be helpful in evaluating the perivalvular leak in helping decide whether and how to intervene.  Options include continued observation, redo surgery, or possible percutaneous occlusion.  There is been multiple reports in the literature successful percutaneous occlusion of these defects but the TEE should help [place] decide if that is a possibility in his case  Plan: Follow-up as scheduled with Dr. Rennis Golden next week.   We will continue to follow and assess need for intervention  [name], MD [facility] [phone]

## 2017-09-07 ENCOUNTER — Encounter: Payer: Self-pay | Admitting: Internal Medicine

## 2017-09-07 ENCOUNTER — Ambulatory Visit: Payer: Commercial Managed Care - PPO | Admitting: Internal Medicine

## 2017-09-07 VITALS — BP 136/72 | HR 83 | Ht 66.0 in | Wt 164.4 lb

## 2017-09-07 DIAGNOSIS — Z952 Presence of prosthetic heart valve: Secondary | ICD-10-CM

## 2017-09-07 DIAGNOSIS — D598 Other acquired hemolytic anemias: Secondary | ICD-10-CM

## 2017-09-07 DIAGNOSIS — T8203XA Leakage of heart valve prosthesis, initial encounter: Secondary | ICD-10-CM | POA: Diagnosis not present

## 2017-09-07 NOTE — Patient Instructions (Addendum)
Dr. Rennis Golden has recommended a TEE. You will be contacted to schedule this at [facility].   Please call our office tomorrow if you have not been contacted by 10am.   You will have lab work done at the hospital on arrival.   Plan to be NPO after midnight tonight 2/12 in the event you can have your procedure tomorrow 2/13.

## 2017-09-08 ENCOUNTER — Other Ambulatory Visit: Payer: Self-pay | Admitting: Oncology

## 2017-09-08 ENCOUNTER — Encounter: Payer: Self-pay | Admitting: Internal Medicine

## 2017-09-08 ENCOUNTER — Inpatient Hospital Stay: Payer: Commercial Managed Care - PPO

## 2017-09-08 DIAGNOSIS — D591 Other autoimmune hemolytic anemias: Secondary | ICD-10-CM | POA: Diagnosis not present

## 2017-09-08 DIAGNOSIS — I350 Nonrheumatic aortic (valve) stenosis: Secondary | ICD-10-CM

## 2017-09-08 DIAGNOSIS — R059 Cough, unspecified: Secondary | ICD-10-CM

## 2017-09-08 DIAGNOSIS — R05 Cough: Secondary | ICD-10-CM

## 2017-09-08 DIAGNOSIS — D594 Other nonautoimmune hemolytic anemias: Secondary | ICD-10-CM

## 2017-09-08 DIAGNOSIS — L930 Discoid lupus erythematosus: Secondary | ICD-10-CM | POA: Diagnosis not present

## 2017-09-08 DIAGNOSIS — D649 Anemia, unspecified: Secondary | ICD-10-CM

## 2017-09-08 DIAGNOSIS — K2100 Gastro-esophageal reflux disease with esophagitis, without bleeding: Secondary | ICD-10-CM

## 2017-09-08 DIAGNOSIS — E785 Hyperlipidemia, unspecified: Secondary | ICD-10-CM

## 2017-09-08 DIAGNOSIS — Z952 Presence of prosthetic heart valve: Secondary | ICD-10-CM

## 2017-09-08 DIAGNOSIS — Q231 Congenital insufficiency of aortic valve: Secondary | ICD-10-CM

## 2017-09-08 DIAGNOSIS — D590 Drug-induced autoimmune hemolytic anemia: Secondary | ICD-10-CM

## 2017-09-08 DIAGNOSIS — D500 Iron deficiency anemia secondary to blood loss (chronic): Secondary | ICD-10-CM

## 2017-09-08 LAB — CBC WITH DIFFERENTIAL/PLATELET
BASOS ABS: 0 10*3/uL (ref 0.0–0.1)
BASOS PCT: 0 %
Eosinophils Absolute: 0 10*3/uL (ref 0.0–0.5)
Eosinophils Relative: 0 %
HEMATOCRIT: 29.2 % — AB (ref 38.4–49.9)
HEMOGLOBIN: 8.9 g/dL — AB (ref 13.0–17.1)
LYMPHS PCT: 10 %
Lymphs Abs: 1.2 10*3/uL (ref 0.9–3.3)
MCH: 30.8 pg (ref 27.2–33.4)
MCHC: 30.5 g/dL — ABNORMAL LOW (ref 32.0–36.0)
MCV: 101 fL — ABNORMAL HIGH (ref 79.3–98.0)
Monocytes Absolute: 0.6 10*3/uL (ref 0.1–0.9)
Monocytes Relative: 5 %
NEUTROS ABS: 10.1 10*3/uL — AB (ref 1.5–6.5)
NEUTROS PCT: 85 %
NRBC: 0 /100{WBCs}
Platelets: 118 10*3/uL — ABNORMAL LOW (ref 140–400)
RBC: 2.89 MIL/uL — AB (ref 4.20–5.82)
RDW: 25 % — ABNORMAL HIGH (ref 11.0–14.6)
WBC: 11.9 10*3/uL — AB (ref 4.0–10.3)

## 2017-09-08 LAB — COMPREHENSIVE METABOLIC PANEL
ALT: 42 U/L (ref 0–55)
ANION GAP: 10 (ref 3–11)
AST: 74 U/L — AB (ref 5–34)
Albumin: 3.8 g/dL (ref 3.5–5.0)
Alkaline Phosphatase: 43 U/L (ref 40–150)
BUN: 16 mg/dL (ref 7–26)
CHLORIDE: 104 mmol/L (ref 98–109)
CO2: 26 mmol/L (ref 22–29)
Calcium: 9.2 mg/dL (ref 8.4–10.4)
Creatinine, Ser: 0.86 mg/dL (ref 0.70–1.30)
Glucose, Bld: 100 mg/dL (ref 70–140)
POTASSIUM: 4.3 mmol/L (ref 3.5–5.1)
Sodium: 140 mmol/L (ref 136–145)
Total Bilirubin: 2.1 mg/dL — ABNORMAL HIGH (ref 0.2–1.2)
Total Protein: 6.4 g/dL (ref 6.4–8.3)

## 2017-09-08 LAB — LACTATE DEHYDROGENASE: LDH: 2319 U/L — ABNORMAL HIGH (ref 125–245)

## 2017-09-08 LAB — RETICULOCYTES
RBC.: 2.89 MIL/uL — AB (ref 4.20–5.82)
RETIC CT PCT: 12.1 % — AB (ref 0.8–1.8)
Retic Count, Absolute: 349.7 10*3/uL — ABNORMAL HIGH (ref 34.8–93.9)

## 2017-09-08 LAB — SAMPLE TO BLOOD BANK

## 2017-09-08 NOTE — Progress Notes (Unsigned)
Runs hemoglobin, LDH, reticulocyte count, are unchanged.  We are dropping the prednisone further to 40 mg daily and we will continue to taper him slowly until he is back off.  He will have his TEE tomorrow he tells me.  Anticipate he will have surgery within the next 2 weeks.  Hopefully that will solve the problem.

## 2017-09-08 NOTE — Progress Notes (Signed)
OFFICE NOTE  Chief Complaint:  Follow-up aortic valve surgery, anemia  Primary Care Physician: Milus Height, PA-C  HPI:  Luis Ferguson is a pleasant 57 year old male who is coming referred to me for an aortic murmur. He underwent an echocardiogram recently which demonstrated an EF of 55-60%, moderate calcific aortic valve stenosis without mention of a bicuspid valve or not, and moderate regurgitation. He apparently was told he had a heart murmur when he was child but this was not followed up on. He denies any chest pain or worsening shortness of breath. He does have a history of dyslipidemia is well and was diagnosed and is on treatment for lupus. There is no family history of congenital heart disease to his knowledge.  01/08/2016  Luis Ferguson returns today for follow-up. Overall he remains asymptomatic. He denies any chest pain or worsening shortness of breath. A repeat echocardiogram was performed which shows stable aortic stenosis and insufficiency. EF remains preserved at 55-60%. The left atrium was moderately dilated.  01/15/2017  Luis Ferguson returns today for follow-up. He recently had a repeat echo which now shows severe aortic stenosis. The aortic valve severely calcified with restricted leaflet motion. There was moderate regurgitation. The mean gradient was 47 mmHg. Left atrium was mildly dilated and LVEF was normal. I discussed the findings with him today and he reports he is for the most part asymptomatic. He works about 10 hours a day and when he comes home and goes and mows the lawn. 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 He reports lifting and being very active without any chest pain, shortness of breath, presyncope, syncope or other associated symptoms. Although he meets criteria by the echo for valve replacement, since he is asymptomatic we could consider watchful waiting at this time.  06/15/2017  Luis Ferguson was seen today in  follow-up.  He had a repeat echocardiogram to follow-up on aortic stenosis.  This was noted to be severe with a mean gradient of 47 mmHg and moderate aortic insufficiency.  6 months of now past and I asked him to watch his symptoms very closely.  He now notes that he has become more short of breath and his wife particularly notes that he fatigues much easier.  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  We did repeat his echo which showed preserved LV function however he is aortic valve gradient is slightly worse, but overall similar to the findings 6 months ago.  Based on his new symptomatology, I am recommending aortic valve replacement.  09/08/2017  Luis Ferguson returns today for follow-up of aortic valve surgery.  He underwent minimally invasive aortic valve replacement by Dr. Dorris Fetch on 07/12/2017, through a right mini thoracotomy.  He had an Counselling psychologist aortic valve placed, size 21 mm. Unfortunately, there was a small to moderate perivalvular leak noted after chest closure.  Subsequently he has struggled with symptoms of heart failure as well as symptomatic anemia.  He was noted to have severe hemolytic anemia requiring transfusion and has undergone extensive workup by Dr. Darnelle Catalan, including treatment with steroids, however his hemolysis persists.  It is felt that possibly the hemolysis is related to his perivalvular leak.  He is seen Dr. Dorris Fetch and we have spoken about pursuing a transesophageal echocardiogram to better understand the severity of his perivalvular leak as well as to characterize the orifice for repair.  In speaking with Dr. Dorris Fetch, we discussed possible options which might include an attempted device closure with an Amplatzer type device if this is feasible, otherwise he may need a redo surgery which would likely be through median sternotomy.  PMHx:  Past Medical History:  Diagnosis Date  . Aortic  stenosis   . Arthritis    achiness in shoulder , elbows  . Connective tissue disease (HCC)    followed by Dr. Kathi Ludwig, GMA, pt. off Prednisone since mid 2017  . Fatigue   . Psoriasis    below the knee on both legs     Past Surgical History:  Procedure Laterality Date  . CARDIAC CATHETERIZATION    . HERNIA REPAIR  2016   left and right  . RIGHT/LEFT HEART CATH AND CORONARY ANGIOGRAPHY N/A 06/16/2017   Procedure: RIGHT/LEFT HEART CATH AND CORONARY ANGIOGRAPHY;  Surgeon: Lyn Records, MD;  Location: [facility]AS[facility];  Service: Cardiovascular;  Laterality: N/A;  . TEE WITHOUT CARDIOVERSION N/A 07/12/2017   Procedure: TRANSESOPHAGEAL ECHOCARDIOGRAM (TEE);  Surgeon: [name], MD;  Location: [facility] OR;  Service: Open Heart Surgery;  Laterality: N/A;    FAMHx:  Family History  Problem Relation Age of Onset  . Diabetes Mother        also HTN  . CAD Father 12       CABGx3  . Liver cancer Brother   . Diabetes Brother     SOCHx:   reports that  has never smoked. he has never used smokeless tobacco. He reports that he does not drink alcohol or use drugs.  ALLERGIES:  Allergies  Allergen Reactions  . Ace Inhibitors Cough  . Codeine Itching and Rash    ROS: Pertinent items noted in HPI and remainder of comprehensive ROS otherwise negative.  HOME MEDS: Current Outpatient Medications  Medication Sig Dispense Refill  . aspirin 81 MG tablet Take 81 mg by mouth daily.    [name] atorvastatin (LIPITOR) 20 MG tablet Take 20 mg by mouth daily.     . calcipotriene (DOVONOX) 0.005 % cream Apply 1 application topically 2 (two) times daily as needed.     . cholecalciferol (VITAMIN D) 1000 UNITS tablet Take 1,000 Units by mouth daily.    . clobetasol ointment (TEMOVATE) 0.05 % Apply 1 application topically 2 (two) times daily as needed.     . furosemide (LASIX) 20 MG tablet Take 1 tablet (20 mg total) by mouth daily. 14 tablet 1  . hydroxychloroquine (PLAQUENIL) 200 MG tablet Take 400  mg by mouth daily.     . metoprolol tartrate (LOPRESSOR) 50 MG tablet Take 1 tablet (50 mg total) by mouth 2 (two) times daily. 60 tablet 6  . potassium chloride (K-DUR,KLOR-CON) 20 MEQ tablet One tablet by  Mouth daily 14 tablet 1  . predniSONE (DELTASONE) 20 MG tablet Take 2.5 tablets (50 mg total) by mouth daily with breakfast. 90 tablet 1  . tamsulosin (FLOMAX) 0.4 MG CAPS capsule Take 0.4 mg by mouth.     No current facility-administered medications for this visit.     LABS/IMAGING: No results found for this or any previous visit (from the past 48 hour(s)). No results found.  WEIGHTS: Wt Readings from Last 3 Encounters:  09/07/17 164 lb 6.4 oz (74.6 kg)  09/03/17 167 lb (75.8 kg)  08/31/17 165 lb 12.8 oz (75.2 kg)    VITALS: BP 136/72   Pulse 83   Ht 5\' 6"  (1.676 m)   Wt 164  lb 6.4 oz (74.6 kg)   SpO2 99%   BMI 26.53 kg/m   EXAM: General appearance: alert and no distress Neck: no carotid bruit, no JVD and thyroid not enlarged, symmetric, no tenderness/mass/nodules Lungs: clear to auscultation bilaterally Heart: regular rate and rhythm, S1, S2 normal and diastolic murmur: mid diastolic 3/6, blowing at apex Abdomen: soft, non-tender; bowel sounds normal; no masses,  no organomegaly Extremities: edema 1+ edema, acrocyanosis Pulses: 2+ and symmetric Skin: pale, mild jaundice, mild scleral icterus Neurologic: Grossly normal Psych: Pleasant  EKG: Deferred  ASSESSMENT: 1. Severe symptomatic calcific aortic valve stenosis - s/p AVR with mild to moderate peri-valvular leak 2. Moderate aortic insufficiency 3. Dyslipidemia 4. Lupus/MCTD  PLAN: 1.   Luis Ferguson unfortunately had surgical complication and was found to have small to moderate paravalvular leak noted after taken off bypass and chest closure.  Subsequently he struggled with a significant hemolytic anemia which is not responded to any medical therapy.  This is thought to be mechanical.  He will need urgent  transesophageal echocardiography to further define his perivalvular leak in order to plan either operative or percutaneous closure.  Will follow-up with him after the procedure and further discuss options with the structural heart team and Dr. Dorris Fetch.  Chrystie Nose, MD, [facility], FACP  [place]  [facility]  Medical Director of the Advanced Lipid Disorders &  Cardiovascular Risk Reduction Clinic Attending Cardiologist  Direct Dial: [phone]  Fax: [phone]  Website:  [email or web address] 09/08/2017, 10:23 AM

## 2017-09-08 NOTE — H&P (View-Only) (Signed)
OFFICE NOTE  Chief Complaint:  Follow-up aortic valve surgery, anemia  Primary Care Physician: Milus Height, PA-C  HPI:  Luis Ferguson is a pleasant 57 year old male who is coming referred to me for an aortic murmur. He underwent an echocardiogram recently which demonstrated an EF of 55-60%, moderate calcific aortic valve stenosis without mention of a bicuspid valve or not, and moderate regurgitation. He apparently was told he had a heart murmur when he was child but this was not followed up on. He denies any chest pain or worsening shortness of breath. He does have a history of dyslipidemia is well and was diagnosed and is on treatment for lupus. There is no family history of congenital heart disease to his knowledge.  01/08/2016  Luis Ferguson returns today for follow-up. Overall he remains asymptomatic. He denies any chest pain or worsening shortness of breath. A repeat echocardiogram was performed which shows stable aortic stenosis and insufficiency. EF remains preserved at 55-60%. The left atrium was moderately dilated.  01/15/2017  Luis Ferguson returns today for follow-up. He recently had a repeat echo which now shows severe aortic stenosis. The aortic valve severely calcified with restricted leaflet motion. There was moderate regurgitation. The mean gradient was 47 mmHg. Left atrium was mildly dilated and LVEF was normal. I discussed the findings with him today and he reports he is for the most part asymptomatic. He works about 10 hours a day and when he comes home and goes and mows the lawn. 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 He reports lifting and being very active without any chest pain, shortness of breath, presyncope, syncope or other associated symptoms. Although he meets criteria by the echo for valve replacement, since he is asymptomatic we could consider watchful waiting at this time.  06/15/2017  Luis Ferguson was seen today in  follow-up.  He had a repeat echocardiogram to follow-up on aortic stenosis.  This was noted to be severe with a mean gradient of 47 mmHg and moderate aortic insufficiency.  6 months of now past and I asked him to watch his symptoms very closely.  He now notes that he has become more short of breath and his wife particularly notes that he fatigues much easier.  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  We did repeat his echo which showed preserved LV function however he is aortic valve gradient is slightly worse, but overall similar to the findings 6 months ago.  Based on his new symptomatology, I am recommending aortic valve replacement.  09/08/2017  Luis Ferguson returns today for follow-up of aortic valve surgery.  He underwent minimally invasive aortic valve replacement by Dr. Dorris Fetch on 07/12/2017, through a right mini thoracotomy.  He had an Counselling psychologist aortic valve placed, size 21 mm. Unfortunately, there was a small to moderate perivalvular leak noted after chest closure.  Subsequently he has struggled with symptoms of heart failure as well as symptomatic anemia.  He was noted to have severe hemolytic anemia requiring transfusion and has undergone extensive workup by Dr. Darnelle Catalan, including treatment with steroids, however his hemolysis persists.  It is felt that possibly the hemolysis is related to his perivalvular leak.  He is seen Dr. Dorris Fetch and we have spoken about pursuing a transesophageal echocardiogram to better understand the severity of his perivalvular leak as well as to characterize the orifice for repair.  In speaking with Dr. Dorris Fetch, we discussed possible options which might include an attempted device closure with an Amplatzer type device if this is feasible, otherwise he may need a redo surgery which would likely be through median sternotomy.  PMHx:  Past Medical History:  Diagnosis Date  . Aortic  stenosis   . Arthritis    achiness in shoulder , elbows  . Connective tissue disease (HCC)    followed by Dr. Kathi Ludwig, GMA, pt. off Prednisone since mid 2017  . Fatigue   . Psoriasis    below the knee on both legs     Past Surgical History:  Procedure Laterality Date  . CARDIAC CATHETERIZATION    . HERNIA REPAIR  2016   left and right  . RIGHT/LEFT HEART CATH AND CORONARY ANGIOGRAPHY N/A 06/16/2017   Procedure: RIGHT/LEFT HEART CATH AND CORONARY ANGIOGRAPHY;  Surgeon: Lyn Records, MD;  Location: [facility] INVASIVE [facility] LAB;  Service: Cardiovascular;  Laterality: N/A;  . TEE WITHOUT CARDIOVERSION N/A 07/12/2017   Procedure: TRANSESOPHAGEAL ECHOCARDIOGRAM (TEE);  Surgeon: [name], MD;  Location: [facility] OR;  Service: Open Heart Surgery;  Laterality: N/A;    FAMHx:  Family History  Problem Relation Age of Onset  . Diabetes Mother        also HTN  . CAD Father 12       CABGx3  . Liver cancer Brother   . Diabetes Brother     SOCHx:   reports that  has never smoked. he has never used smokeless tobacco. He reports that he does not drink alcohol or use drugs.  ALLERGIES:  Allergies  Allergen Reactions  . Ace Inhibitors Cough  . Codeine Itching and Rash    ROS: Pertinent items noted in HPI and remainder of comprehensive ROS otherwise negative.  HOME MEDS: Current Outpatient Medications  Medication Sig Dispense Refill  . aspirin 81 MG tablet Take 81 mg by mouth daily.    [name] atorvastatin (LIPITOR) 20 MG tablet Take 20 mg by mouth daily.     . calcipotriene (DOVONOX) 0.005 % cream Apply 1 application topically 2 (two) times daily as needed.     . cholecalciferol (VITAMIN D) 1000 UNITS tablet Take 1,000 Units by mouth daily.    . clobetasol ointment (TEMOVATE) 0.05 % Apply 1 application topically 2 (two) times daily as needed.     . furosemide (LASIX) 20 MG tablet Take 1 tablet (20 mg total) by mouth daily. 14 tablet 1  . hydroxychloroquine (PLAQUENIL) 200 MG tablet Take 400  mg by mouth daily.     . metoprolol tartrate (LOPRESSOR) 50 MG tablet Take 1 tablet (50 mg total) by mouth 2 (two) times daily. 60 tablet 6  . potassium chloride (K-DUR,KLOR-CON) 20 MEQ tablet One tablet by  Mouth daily 14 tablet 1  . predniSONE (DELTASONE) 20 MG tablet Take 2.5 tablets (50 mg total) by mouth daily with breakfast. 90 tablet 1  . tamsulosin (FLOMAX) 0.4 MG CAPS capsule Take 0.4 mg by mouth.     No current facility-administered medications for this visit.     LABS/IMAGING: No results found for this or any previous visit (from the past 48 hour(s)). No results found.  WEIGHTS: Wt Readings from Last 3 Encounters:  09/07/17 164 lb 6.4 oz (74.6 kg)  09/03/17 167 lb (75.8 kg)  08/31/17 165 lb 12.8 oz (75.2 kg)    VITALS: BP 136/72   Pulse 83   Ht 5\' 6"  (1.676 m)   Wt 164  lb 6.4 oz (74.6 kg)   SpO2 99%   BMI 26.53 kg/m   EXAM: General appearance: alert and no distress Neck: no carotid bruit, no JVD and thyroid not enlarged, symmetric, no tenderness/mass/nodules Lungs: clear to auscultation bilaterally Heart: regular rate and rhythm, S1, S2 normal and diastolic murmur: mid diastolic 3/6, blowing at apex Abdomen: soft, non-tender; bowel sounds normal; no masses,  no organomegaly Extremities: edema 1+ edema, acrocyanosis Pulses: 2+ and symmetric Skin: pale, mild jaundice, mild scleral icterus Neurologic: Grossly normal Psych: Pleasant  EKG: Deferred  ASSESSMENT: 1. Severe symptomatic calcific aortic valve stenosis - s/p AVR with mild to moderate peri-valvular leak 2. Moderate aortic insufficiency 3. Dyslipidemia 4. Lupus/MCTD  PLAN: 1.   Luis Ferguson unfortunately had surgical complication and was found to have small to moderate paravalvular leak noted after taken off bypass and chest closure.  Subsequently he struggled with a significant hemolytic anemia which is not responded to any medical therapy.  This is thought to be mechanical.  He will need urgent  transesophageal echocardiography to further define his perivalvular leak in order to plan either operative or percutaneous closure.  Will follow-up with him after the procedure and further discuss options with the structural heart team and Dr. Dorris Fetch.  Chrystie Nose, MD, [facility], FACP  [place]  [facility]  Medical Director of the Advanced Lipid Disorders &  Cardiovascular Risk Reduction Clinic Attending Cardiologist  Direct Dial: [phone]  Fax: [phone]  Website:  [email or web address] 09/08/2017, 10:23 AM

## 2017-09-09 ENCOUNTER — Encounter (HOSPITAL_COMMUNITY): Payer: Self-pay | Admitting: *Deleted

## 2017-09-09 ENCOUNTER — Other Ambulatory Visit: Payer: Self-pay | Admitting: *Deleted

## 2017-09-09 ENCOUNTER — Ambulatory Visit (HOSPITAL_COMMUNITY)
Admission: RE | Admit: 2017-09-09 | Discharge: 2017-09-09 | Disposition: A | Discharge status: Home / Self Care | Payer: Commercial Managed Care - PPO | Source: Ambulatory Visit | Attending: Cardiovascular Disease | Admitting: Cardiovascular Disease

## 2017-09-09 ENCOUNTER — Other Ambulatory Visit: Payer: Self-pay | Admitting: Oncology

## 2017-09-09 ENCOUNTER — Other Ambulatory Visit: Payer: Self-pay

## 2017-09-09 ENCOUNTER — Encounter (HOSPITAL_COMMUNITY)
Admission: RE | Disposition: A | Discharge status: Home / Self Care | Payer: Self-pay | Source: Ambulatory Visit | Attending: Cardiovascular Disease

## 2017-09-09 ENCOUNTER — Ambulatory Visit (HOSPITAL_BASED_OUTPATIENT_CLINIC_OR_DEPARTMENT_OTHER): Payer: Commercial Managed Care - PPO

## 2017-09-09 DIAGNOSIS — M351 Other overlap syndromes: Secondary | ICD-10-CM | POA: Insufficient documentation

## 2017-09-09 DIAGNOSIS — Y712 Prosthetic and other implants, materials and accessory cardiovascular devices associated with adverse incidents: Secondary | ICD-10-CM | POA: Insufficient documentation

## 2017-09-09 DIAGNOSIS — Z8249 Family history of ischemic heart disease and other diseases of the circulatory system: Secondary | ICD-10-CM | POA: Insufficient documentation

## 2017-09-09 DIAGNOSIS — I351 Nonrheumatic aortic (valve) insufficiency: Secondary | ICD-10-CM | POA: Insufficient documentation

## 2017-09-09 DIAGNOSIS — Z952 Presence of prosthetic heart valve: Secondary | ICD-10-CM | POA: Diagnosis not present

## 2017-09-09 DIAGNOSIS — E785 Hyperlipidemia, unspecified: Secondary | ICD-10-CM | POA: Insufficient documentation

## 2017-09-09 DIAGNOSIS — Z7982 Long term (current) use of aspirin: Secondary | ICD-10-CM | POA: Insufficient documentation

## 2017-09-09 DIAGNOSIS — Z885 Allergy status to narcotic agent status: Secondary | ICD-10-CM | POA: Insufficient documentation

## 2017-09-09 DIAGNOSIS — T8203XA Leakage of heart valve prosthesis, initial encounter: Secondary | ICD-10-CM

## 2017-09-09 DIAGNOSIS — M329 Systemic lupus erythematosus, unspecified: Secondary | ICD-10-CM | POA: Insufficient documentation

## 2017-09-09 DIAGNOSIS — Z79899 Other long term (current) drug therapy: Secondary | ICD-10-CM | POA: Diagnosis not present

## 2017-09-09 DIAGNOSIS — Z888 Allergy status to other drugs, medicaments and biological substances status: Secondary | ICD-10-CM | POA: Insufficient documentation

## 2017-09-09 HISTORY — PX: TEE WITHOUT CARDIOVERSION: SHX5443

## 2017-09-09 LAB — HAPTOGLOBIN

## 2017-09-09 SURGERY — ECHOCARDIOGRAM, TRANSESOPHAGEAL
Anesthesia: Moderate Sedation

## 2017-09-09 MED ORDER — BUTAMBEN-TETRACAINE-BENZOCAINE 2-2-14 % EX AERO
INHALATION_SPRAY | CUTANEOUS | Status: DC | PRN
  Administered 2017-09-09: 2 via TOPICAL

## 2017-09-09 MED ORDER — MIDAZOLAM HCL 10 MG/2ML IJ SOLN
INTRAMUSCULAR | Status: DC | PRN
  Administered 2017-09-09: 2 mg via INTRAVENOUS
  Administered 2017-09-09: 1 mg via INTRAVENOUS

## 2017-09-09 MED ORDER — SODIUM CHLORIDE 0.9 % IV SOLN
INTRAVENOUS | Status: DC
  Administered 2017-09-09: 500 mL via INTRAVENOUS

## 2017-09-09 MED ORDER — FENTANYL CITRATE (PF) 100 MCG/2ML IJ SOLN
INTRAMUSCULAR | Status: DC | PRN
  Administered 2017-09-09: 25 ug via INTRAVENOUS
  Administered 2017-09-09: 50 ug via INTRAVENOUS

## 2017-09-09 MED ORDER — MIDAZOLAM HCL 5 MG/ML IJ SOLN
INTRAMUSCULAR | Status: AC
  Filled 2017-09-09: qty 2

## 2017-09-09 MED ORDER — FENTANYL CITRATE (PF) 100 MCG/2ML IJ SOLN
INTRAMUSCULAR | Status: AC
  Filled 2017-09-09: qty 2

## 2017-09-09 NOTE — Interval H&P Note (Signed)
History and Physical Interval Note:  09/09/2017 8:30 AM  Luis Ferguson  has presented today for surgery, with the diagnosis of aortic valve insuffiency  The various methods of treatment have been discussed with the patient and family. After consideration of risks, benefits and other options for treatment, the patient has consented to  Procedure(s): TRANSESOPHAGEAL ECHOCARDIOGRAM (TEE) (N/A) as a surgical intervention .  [name] history has been reviewed, patient examined, no change in status, stable for surgery.  I have reviewed the patient's chart and labs.  Questions were answered to the patient's satisfaction.     [name]

## 2017-09-09 NOTE — Discharge Instructions (Signed)

## 2017-09-09 NOTE — Progress Notes (Signed)
  Echocardiogram Echocardiogram Transesophageal has been performed.  Leta Jungling M 09/09/2017, 9:51 AM

## 2017-09-09 NOTE — Op Note (Signed)
INDICATIONS: aortic valve prosthesis insufficiency  PROCEDURE:   Informed consent was obtained prior to the procedure. The risks, benefits and alternatives for the procedure were discussed and the patient comprehended these risks.  Risks include, but are not limited to, cough, sore throat, vomiting, nausea, somnolence, esophageal and stomach trauma or perforation, bleeding, low blood pressure, aspiration, pneumonia, infection, trauma to the teeth and death.    After a procedural time-out, the oropharynx was anesthetized with 20% benzocaine spray.   During this procedure the patient was administered a total of Versed 3 mg and Fentanyl 75 mcg to achieve and maintain moderate conscious sedation.  [name] heart rate, blood pressure, and oxygen saturationweare monitored continuously during the procedure. The period of conscious sedation was 24 minutes, of which I was present face-to-face 100% of this time.  The transesophageal probe was inserted in the esophagus and stomach without difficulty and multiple views were obtained.  The patient was kept under observation until the patient left the procedure room.  The patient left the procedure room in stable condition.   Agitated microbubble saline contrast was not administered.  COMPLICATIONS:    There were no immediate complications.  FINDINGS:  Severe aortic insufficiency. There is a severe perivalvular leak. This appears to be at the medial aspect of the prosthetic ring, towards the ventricular septum, There is also a mild intravalvular leak. The aortic prosthesis leaflets move normally. There transprosthetic gradients are increased due to high stroke volume. Normal LV function. No other abnormalities are seen  RECOMMENDATIONS:     Consider Amplatzer occluder versus surgical repair.  Time Spent Directly with the Patient:  45 minutes   Luis Ferguson 09/09/2017, 9:27 AM

## 2017-09-10 ENCOUNTER — Encounter (HOSPITAL_COMMUNITY): Payer: Self-pay | Admitting: Cardiovascular Disease

## 2017-09-13 ENCOUNTER — Other Ambulatory Visit: Payer: Self-pay | Admitting: Oncology

## 2017-09-14 ENCOUNTER — Ambulatory Visit (INDEPENDENT_AMBULATORY_CARE_PROVIDER_SITE_OTHER): Payer: Self-pay | Admitting: Thoracic Surgery (Cardiothoracic Vascular Surgery)

## 2017-09-14 ENCOUNTER — Inpatient Hospital Stay: Payer: Commercial Managed Care - PPO

## 2017-09-14 ENCOUNTER — Encounter: Payer: Self-pay | Admitting: Thoracic Surgery (Cardiothoracic Vascular Surgery)

## 2017-09-14 ENCOUNTER — Other Ambulatory Visit: Payer: Self-pay

## 2017-09-14 ENCOUNTER — Other Ambulatory Visit: Payer: Self-pay | Admitting: *Deleted

## 2017-09-14 VITALS — BP 134/68 | HR 88 | Ht 66.0 in | Wt 166.2 lb

## 2017-09-14 DIAGNOSIS — R05 Cough: Secondary | ICD-10-CM

## 2017-09-14 DIAGNOSIS — K2100 Gastro-esophageal reflux disease with esophagitis, without bleeding: Secondary | ICD-10-CM

## 2017-09-14 DIAGNOSIS — Z952 Presence of prosthetic heart valve: Secondary | ICD-10-CM

## 2017-09-14 DIAGNOSIS — I350 Nonrheumatic aortic (valve) stenosis: Secondary | ICD-10-CM | POA: Diagnosis not present

## 2017-09-14 DIAGNOSIS — D591 Other autoimmune hemolytic anemias: Secondary | ICD-10-CM | POA: Diagnosis not present

## 2017-09-14 DIAGNOSIS — L930 Discoid lupus erythematosus: Secondary | ICD-10-CM | POA: Diagnosis not present

## 2017-09-14 DIAGNOSIS — R059 Cough, unspecified: Secondary | ICD-10-CM

## 2017-09-14 DIAGNOSIS — D590 Drug-induced autoimmune hemolytic anemia: Secondary | ICD-10-CM

## 2017-09-14 DIAGNOSIS — Q231 Congenital insufficiency of aortic valve: Secondary | ICD-10-CM

## 2017-09-14 DIAGNOSIS — T8203XA Leakage of heart valve prosthesis, initial encounter: Secondary | ICD-10-CM

## 2017-09-14 DIAGNOSIS — D500 Iron deficiency anemia secondary to blood loss (chronic): Secondary | ICD-10-CM

## 2017-09-14 DIAGNOSIS — E785 Hyperlipidemia, unspecified: Secondary | ICD-10-CM

## 2017-09-14 DIAGNOSIS — D594 Other nonautoimmune hemolytic anemias: Secondary | ICD-10-CM

## 2017-09-14 DIAGNOSIS — D649 Anemia, unspecified: Secondary | ICD-10-CM

## 2017-09-14 DIAGNOSIS — M25473 Effusion, unspecified ankle: Secondary | ICD-10-CM | POA: Diagnosis not present

## 2017-09-14 LAB — CBC WITH DIFFERENTIAL/PLATELET
Basophils Absolute: 0 K/uL (ref 0.0–0.1)
Basophils Relative: 0 %
Eosinophils Absolute: 0 K/uL (ref 0.0–0.5)
Eosinophils Relative: 0 %
HCT: 29.7 % — ABNORMAL LOW (ref 38.4–49.9)
Hemoglobin: 8.8 g/dL — ABNORMAL LOW (ref 13.0–17.1)
Lymphocytes Relative: 15 %
Lymphs Abs: 1.4 K/uL (ref 0.9–3.3)
MCH: 30.2 pg (ref 27.2–33.4)
MCHC: 29.6 g/dL — ABNORMAL LOW (ref 32.0–36.0)
MCV: 102.1 fL — ABNORMAL HIGH (ref 79.3–98.0)
Monocytes Absolute: 0.5 K/uL (ref 0.1–0.9)
Monocytes Relative: 6 %
Neutro Abs: 7.3 K/uL — ABNORMAL HIGH (ref 1.5–6.5)
Neutrophils Relative %: 79 %
Platelets: 129 K/uL — ABNORMAL LOW (ref 140–400)
RBC: 2.91 MIL/uL — ABNORMAL LOW (ref 4.20–5.82)
RDW: 23 % — ABNORMAL HIGH (ref 11.0–14.6)
WBC: 9.3 K/uL (ref 4.0–10.3)

## 2017-09-14 LAB — RETICULOCYTES
RBC.: 2.91 MIL/uL — AB (ref 4.20–5.82)
RETIC COUNT ABSOLUTE: 288.1 10*3/uL — AB (ref 34.8–93.9)
Retic Ct Pct: 9.9 % — ABNORMAL HIGH (ref 0.8–1.8)

## 2017-09-14 LAB — COMPREHENSIVE METABOLIC PANEL WITH GFR
ALT: 42 U/L (ref 0–55)
AST: 72 U/L — ABNORMAL HIGH (ref 5–34)
Albumin: 3.8 g/dL (ref 3.5–5.0)
Alkaline Phosphatase: 39 U/L — ABNORMAL LOW (ref 40–150)
Anion gap: 11 (ref 3–11)
BUN: 20 mg/dL (ref 7–26)
CO2: 25 mmol/L (ref 22–29)
Calcium: 9.2 mg/dL (ref 8.4–10.4)
Chloride: 103 mmol/L (ref 98–109)
Creatinine, Ser: 0.84 mg/dL (ref 0.70–1.30)
GFR calc Af Amer: >60 mL/min
GFR calc non Af Amer: >60 mL/min
Glucose, Bld: 129 mg/dL (ref 70–140)
Potassium: 3.9 mmol/L (ref 3.5–5.1)
Sodium: 139 mmol/L (ref 136–145)
Total Bilirubin: 1.9 mg/dL — ABNORMAL HIGH (ref 0.2–1.2)
Total Protein: 6.2 g/dL — ABNORMAL LOW (ref 6.4–8.3)

## 2017-09-14 LAB — LACTATE DEHYDROGENASE: LDH: 2214 U/L — AB (ref 125–245)

## 2017-09-14 NOTE — Progress Notes (Signed)
[address]       [name] [address]             [phone]    HPI: Luis Ferguson returns for a scheduled follow-up visit  Luis Ferguson is a 57 year old man with a history of lupus, dyslipidemia, and aortic stenosis and insufficiency.  He developed decreased energy and fatigue, peripheral edema and dizziness with exertion.  An echocardiogram showed severe aortic stenosis with a mean gradient of 47 mmHg.  He was referred for aortic valve replacement.  I did an aortic valve replacement with an Edwards Intuity valve on 07/12/2017 via a right mini thoracotomy approach.  Initially no paravalvular leak was seen but just after chest closure he was noted to have a small paravalvular leak on echo.  We elected to follow that in hopes that it would resolve with time.  His initial postoperative course was uncomplicated and he went home on day 6.  He had minimal discomfort.  He did well initially but then developed fatigue and weakness.  He was found to be severely anemic and required blood transfusion.  He was diagnosed with hemolytic anemia.  Immunologic workup was negative.  An echocardiogram showed a persistent paravalvular leak.  He developed peripheral edema.  A transesophageal echocardiogram was done on 09/09/2017 which showed a severe perivalvular leak.  There was preserved left ventricular function.  He is anxious about having to consider redo surgery.  His swelling has improved with low-dose Lasix.  He is not having any shortness of breath.  He is concerned about finances with returning to work.  Past Medical History:  Diagnosis Date  . Aortic stenosis   . Arthritis    achiness in shoulder , elbows  . Connective tissue disease (HCC)    followed by Dr. Kathi Ludwig, [facility], pt. off Prednisone since mid 2017  . Fatigue   . Psoriasis    below the knee on both legs    Past Surgical History:  Procedure Laterality Date  . CARDIAC CATHETERIZATION    . HERNIA REPAIR  2016   left  and right  . RIGHT/LEFT HEART CATH AND CORONARY ANGIOGRAPHY N/A 06/16/2017   Procedure: RIGHT/LEFT HEART CATH AND CORONARY ANGIOGRAPHY;  Surgeon: Lyn Records, MD;  Location: [facility] INVASIVE CV LAB;  Service: Cardiovascular;  Laterality: N/A;  . TEE WITHOUT CARDIOVERSION N/A 07/12/2017   Procedure: TRANSESOPHAGEAL ECHOCARDIOGRAM (TEE);  Surgeon: [name], MD;  Location: [facility] OR;  Service: Open Heart Surgery;  Laterality: N/A;  . TEE WITHOUT CARDIOVERSION N/A 09/09/2017   Procedure: TRANSESOPHAGEAL ECHOCARDIOGRAM (TEE);  Surgeon: Thurmon Fair, MD;  Location: [facility] ENDOSCOPY;  Service: Cardiovascular;  Laterality: N/A;    Current Outpatient Medications  Medication Sig Dispense Refill  . aspirin 81 MG tablet Take 81 mg by mouth daily.    [name] atorvastatin (LIPITOR) 20 MG tablet Take 20 mg by mouth daily.     . calcipotriene (DOVONOX) 0.005 % cream Apply 1 application topically 2 (two) times daily as needed.     . cholecalciferol (VITAMIN D) 1000 UNITS tablet Take 1,000 Units by mouth daily.    . clobetasol ointment (TEMOVATE) 0.05 % Apply 1 application topically 2 (two) times daily as needed.     . furosemide (LASIX) 20 MG tablet Take 1 tablet (20 mg total) by mouth daily. 14 tablet 1  . hydroxychloroquine (PLAQUENIL) 200 MG tablet Take 400 mg by mouth daily.     . metoprolol tartrate (LOPRESSOR) 50 MG tablet  Take 1 tablet (50 mg total) by mouth 2 (two) times daily. 60 tablet 6  . potassium chloride (K-DUR,KLOR-CON) 20 MEQ tablet One tablet by  Mouth daily 14 tablet 1  . predniSONE (DELTASONE) 20 MG tablet Take 2.5 tablets (50 mg total) by mouth daily with breakfast. 90 tablet 1  . tamsulosin (FLOMAX) 0.4 MG CAPS capsule Take 0.4 mg by mouth.     No current facility-administered medications for this visit.     Physical Exam BP 134/68 (BP Location: Left Arm, Patient Position: Sitting, Cuff Size: Large)   Pulse 88   Ht 5\' 6"  (1.676 m)   Wt 166 lb 3.2 oz (75.4 kg)   SpO2 99% Comment: RA   BMI 26.97 kg/m  57 year old man in no acute distress Alert and oriented x3 with no focal deficits Lungs clear with equal breath sounds bilaterally Right chest incision well-healed Cardiac regular rate and rhythm with systolic and diastolic murmur Abdomen soft nontender Extremities with 1+ edema  Diagnostic Tests: Transesophageal echocardiogram 09/09/2017 Study Conclusions  - Left ventricle: There was mild concentric hypertrophy. Systolic   function was normal. The estimated ejection fraction was in the   range of 60% to 65%. Wall motion was normal; there were no   regional wall motion abnormalities. - Aortic valve: A bioprosthesis was present. There was mild   intravalvular regurgitation directed centrally in the LVOT. There   was severe perivalvular regurgitation. The perivalvuilar leak   occurs medially, in the general area of the noncoronary sinus.   The jet is highly eccentric, hugging the LVOT wall posteriorly,   towards the left atrium. Regurgitant fraction is approximately   50%. - Left atrium: No evidence of thrombus in the atrial cavity or   appendage. No evidence of thrombus in the atrial cavity or   appendage. No evidence of thrombus in the appendage. - Right atrium: No evidence of thrombus in the atrial cavity or   appendage. - Atrial septum: No defect or patent foramen ovale was identified.  I personally reviewed the images and also reviewed in conjunction with Dr. Rennis Golden previously.  Impression: Luis Ferguson is a 57 year old gentleman who had an aortic valve replacement via right minithoracotomy about 2 months ago.  His postoperative course was unremarkable initially but he developed hemolytic anemia.  He has been found to have a significant paravalvular leak which is almost certainly the source of his hemolytic anemia.  He also has some signs of heart failure with peripheral edema.  His peripheral edema has improved, but his hemolytic anemia persists.  Had a long  discussion with Mr. and Mrs. Sangha.  I previously had a long discussion with Dr. Rennis Golden regarding options.  They understand there would be essentially an option for an occluder device to be placed percutaneously.  Our concern is that that will not give him a long-term durable result.  Given he is young and otherwise in good health we want to achieve the most durable long-term result possible.  I recommended that we proceed with a median sternotomy for repair of the paravalvular leak and possible redo aortic valve replacement.  I discussed the general nature of the procedure with Mr. Mrs. Ferguson.  They are familiar with it.  It is essentially a repeat of his previous operation.  We will try to repair the leak if possible but if not we will do a redo valve replacement.  We reviewed the indications, risks, benefits, and alternatives.  They are familiar with the risks from his  previous surgery.  They do understand there is a high risk of needing blood transfusion at this time due to his anemia.  The other risks include, but not limited to death, MI, DVT, PE, bleeding, infection, heart block requiring pacemaker, cardiac arrhythmias, as well as other organ system dysfunction including respiratory, renal or GI complications.   He accepts the risks and wishes to proceed  Plan: Median sternotomy for repair of paravalvular leak or possible redo aortic valve replacement on Friday, 09/17/2017  [name], MD [facility] [phone]

## 2017-09-15 ENCOUNTER — Other Ambulatory Visit: Payer: Commercial Managed Care - PPO

## 2017-09-15 LAB — HAPTOGLOBIN: Haptoglobin: <10 mg/dL — ABNORMAL LOW (ref 34–200)

## 2017-09-15 NOTE — Pre-Procedure Instructions (Signed)
Luis Ferguson  09/15/2017      [id number] - [place], [place] - [address] [place] [address] Phone: [phone] Fax: [phone]  [facility] - [place], [place] - [address] [place] [address] Phone: [phone] Fax: [phone]    Your procedure is scheduled on Friday, September 17, 2017  Report to [facility] Admitting at 5:30 A.M.  Call this number if you have problems the morning of surgery:  [phone]   Remember:  Do not eat food or drink liquids after midnight.  Take these medicines the morning of surgery with A SIP OF WATER : metoprolol tartrate (LOPRESSOR),  hydroxychloroquine (PLAQUENIL), predniSONE (DELTASONE), tamsulosin (FLOMAX) Stop taking vitamins, Omega-3 Fatty Acids (FISH OIL PO)  and herbal medications. Do not take any NSAIDs ie: Ibuprofen, Advil, Naproxen (Aleve), Motrin, BC and Goody Powder; stop now.  Do not wear jewelry, make-up or nail polish.  Do not wear lotions, powders, or perfumes, or deodorant.  Do not shave 48 hours prior to surgery.  Men may shave face and neck.  Do not bring valuables to the hospital.  [facility] is not responsible for any belongings or valuables.  Contacts, dentures or bridgework may not be worn into surgery.  Leave your suitcase in the car.  After surgery it may be brought to your room.  For patients admitted to the hospital, discharge time will be determined by your treatment team.  Patients discharged the day of surgery will not be allowed to drive home.  Special instructions:  [facility] - Preparing for Surgery  Before surgery, you can play an important role.  Because skin is not sterile, your skin needs to be as free of germs as possible.  You can reduce the number of germs on you skin by washing with CHG (chlorahexidine gluconate) soap before surgery.  CHG is an antiseptic cleaner which kills  germs and bonds with the skin to continue killing germs even after washing.  Please DO NOT use if you have an allergy to CHG or antibacterial soaps.  If your skin becomes reddened/irritated stop using the CHG and inform your nurse when you arrive at Short Stay.  Do not shave (including legs and underarms) for at least 48 hours prior to the first CHG shower.  You may shave your face.  Please follow these instructions carefully:   1.  Shower with CHG Soap the night before surgery and the morning of Surgery.  2.  If you choose to wash your hair, wash your hair first as usual with your normal shampoo.  3.  After you shampoo, rinse your hair and body thoroughly to remove the Shampoo.  4.  Use CHG as you would any other liquid soap.  You can apply chg directly  to the skin and wash gently with scrungie or a clean washcloth.  5.  Apply the CHG Soap to your body ONLY FROM THE NECK DOWN.  Do not use on open wounds or open sores.  Avoid contact with your eyes, ears, mouth and genitals (private parts).  Wash genitals (private parts) with your normal soap.  6.  Wash thoroughly, paying special attention to the area where your surgery will be performed.  7.  Thoroughly rinse your body with warm water from the neck down.  8.  DO NOT shower/wash with your normal soap after using and rinsing off the CHG Soap.  9.  Pat yourself dry with a clean towel.            10.  Wear clean pajamas.            11.  Place clean sheets on your bed the night of your first shower and do not sleep with pets.  Day of Surgery  Do not apply any lotions/deodorants the morning of surgery.  Please wear clean clothes to the hospital/surgery center.  Please read over the following fact sheets that you were given. Pain Booklet, Coughing and Deep Breathing, Blood Transfusion Information, MRSA Information and Surgical Site Infection Prevention

## 2017-09-16 ENCOUNTER — Encounter (HOSPITAL_COMMUNITY)
Admission: RE | Admit: 2017-09-16 | Discharge: 2017-09-16 | Disposition: A | Discharge status: Home / Self Care | Payer: Commercial Managed Care - PPO | Source: Ambulatory Visit | Attending: Thoracic Surgery (Cardiothoracic Vascular Surgery) | Admitting: Thoracic Surgery (Cardiothoracic Vascular Surgery)

## 2017-09-16 ENCOUNTER — Other Ambulatory Visit: Payer: Self-pay

## 2017-09-16 ENCOUNTER — Encounter (HOSPITAL_COMMUNITY): Payer: Self-pay | Admitting: Certified Registered Nurse Anesthetist

## 2017-09-16 ENCOUNTER — Ambulatory Visit (HOSPITAL_COMMUNITY)
Admission: RE | Admit: 2017-09-16 | Discharge: 2017-09-16 | Disposition: A | Discharge status: Home / Self Care | Payer: Commercial Managed Care - PPO | Source: Ambulatory Visit | Attending: Thoracic Surgery (Cardiothoracic Vascular Surgery) | Admitting: Thoracic Surgery (Cardiothoracic Vascular Surgery)

## 2017-09-16 ENCOUNTER — Encounter (HOSPITAL_COMMUNITY): Payer: Self-pay

## 2017-09-16 DIAGNOSIS — T8203XA Leakage of heart valve prosthesis, initial encounter: Secondary | ICD-10-CM | POA: Diagnosis not present

## 2017-09-16 DIAGNOSIS — D649 Anemia, unspecified: Secondary | ICD-10-CM | POA: Diagnosis not present

## 2017-09-16 DIAGNOSIS — I2119 ST elevation (STEMI) myocardial infarction involving other coronary artery of inferior wall: Secondary | ICD-10-CM | POA: Diagnosis not present

## 2017-09-16 DIAGNOSIS — Y838 Other surgical procedures as the cause of abnormal reaction of the patient, or of later complication, without mention of misadventure at the time of the procedure: Secondary | ICD-10-CM

## 2017-09-16 DIAGNOSIS — I351 Nonrheumatic aortic (valve) insufficiency: Secondary | ICD-10-CM | POA: Diagnosis not present

## 2017-09-16 DIAGNOSIS — Z01818 Encounter for other preprocedural examination: Secondary | ICD-10-CM | POA: Insufficient documentation

## 2017-09-16 DIAGNOSIS — I350 Nonrheumatic aortic (valve) stenosis: Secondary | ICD-10-CM | POA: Diagnosis not present

## 2017-09-16 DIAGNOSIS — K219 Gastro-esophageal reflux disease without esophagitis: Secondary | ICD-10-CM | POA: Diagnosis not present

## 2017-09-16 DIAGNOSIS — I498 Other specified cardiac arrhythmias: Secondary | ICD-10-CM | POA: Insufficient documentation

## 2017-09-16 DIAGNOSIS — D696 Thrombocytopenia, unspecified: Secondary | ICD-10-CM | POA: Diagnosis not present

## 2017-09-16 DIAGNOSIS — I38 Endocarditis, valve unspecified: Secondary | ICD-10-CM | POA: Diagnosis not present

## 2017-09-16 DIAGNOSIS — J9811 Atelectasis: Secondary | ICD-10-CM | POA: Diagnosis not present

## 2017-09-16 DIAGNOSIS — I081 Rheumatic disorders of both mitral and tricuspid valves: Secondary | ICD-10-CM | POA: Diagnosis not present

## 2017-09-16 DIAGNOSIS — Z0183 Encounter for blood typing: Secondary | ICD-10-CM

## 2017-09-16 DIAGNOSIS — Z01812 Encounter for preprocedural laboratory examination: Secondary | ICD-10-CM | POA: Insufficient documentation

## 2017-09-16 DIAGNOSIS — M329 Systemic lupus erythematosus, unspecified: Secondary | ICD-10-CM | POA: Diagnosis not present

## 2017-09-16 DIAGNOSIS — Z79899 Other long term (current) drug therapy: Secondary | ICD-10-CM | POA: Diagnosis not present

## 2017-09-16 DIAGNOSIS — T380X5A Adverse effect of glucocorticoids and synthetic analogues, initial encounter: Secondary | ICD-10-CM | POA: Diagnosis not present

## 2017-09-16 DIAGNOSIS — Z4509 Encounter for adjustment and management of other cardiac device: Secondary | ICD-10-CM | POA: Diagnosis not present

## 2017-09-16 DIAGNOSIS — I319 Disease of pericardium, unspecified: Secondary | ICD-10-CM | POA: Diagnosis not present

## 2017-09-16 DIAGNOSIS — Z952 Presence of prosthetic heart valve: Secondary | ICD-10-CM | POA: Diagnosis not present

## 2017-09-16 DIAGNOSIS — E8770 Fluid overload, unspecified: Secondary | ICD-10-CM | POA: Diagnosis not present

## 2017-09-16 DIAGNOSIS — D62 Acute posthemorrhagic anemia: Secondary | ICD-10-CM | POA: Diagnosis not present

## 2017-09-16 DIAGNOSIS — D589 Hereditary hemolytic anemia, unspecified: Secondary | ICD-10-CM | POA: Diagnosis not present

## 2017-09-16 DIAGNOSIS — I10 Essential (primary) hypertension: Secondary | ICD-10-CM | POA: Diagnosis not present

## 2017-09-16 HISTORY — DX: Other specified postprocedural states: Z98.890

## 2017-09-16 HISTORY — DX: Headache, unspecified: R51.9

## 2017-09-16 HISTORY — DX: Headache: R51

## 2017-09-16 HISTORY — DX: Presence of spectacles and contact lenses: Z97.3

## 2017-09-16 HISTORY — DX: Anemia, unspecified: D64.9

## 2017-09-16 HISTORY — DX: Leakage of heart valve prosthesis, initial encounter: T82.03XA

## 2017-09-16 HISTORY — DX: Other specified postprocedural states: R11.2

## 2017-09-16 LAB — URINALYSIS, ROUTINE W REFLEX MICROSCOPIC
BILIRUBIN URINE: NEGATIVE
Glucose, UA: NEGATIVE mg/dL
Ketones, ur: NEGATIVE mg/dL
Leukocytes, UA: NEGATIVE
Nitrite: NEGATIVE
PH: 6 (ref 5.0–8.0)
Protein, ur: 30 mg/dL — AB
SPECIFIC GRAVITY, URINE: 1.012 (ref 1.005–1.030)

## 2017-09-16 LAB — BLOOD GAS, ARTERIAL
Acid-Base Excess: 2.2 mmol/L — ABNORMAL HIGH (ref 0.0–2.0)
Bicarbonate: 25.6 mmol/L (ref 20.0–28.0)
Drawn by: 449841
FIO2: 21
O2 Saturation: 99.7 %
PCO2 ART: 35.6 mmHg (ref 32.0–48.0)
PH ART: 7.471 — AB (ref 7.350–7.450)
Patient temperature: 98.6
pO2, Arterial: 119 mmHg — ABNORMAL HIGH (ref 83.0–108.0)

## 2017-09-16 LAB — PROTIME-INR
INR: 1.17
Prothrombin Time: 14.8 seconds (ref 11.4–15.2)

## 2017-09-16 LAB — COMPREHENSIVE METABOLIC PANEL
ALK PHOS: 37 U/L — AB (ref 38–126)
ALT: 45 U/L (ref 17–63)
AST: 70 U/L — ABNORMAL HIGH (ref 15–41)
Albumin: 3.8 g/dL (ref 3.5–5.0)
Anion gap: 12 (ref 5–15)
BUN: 19 mg/dL (ref 6–20)
CALCIUM: 9.3 mg/dL (ref 8.9–10.3)
CO2: 22 mmol/L (ref 22–32)
CREATININE: 0.78 mg/dL (ref 0.61–1.24)
Chloride: 106 mmol/L (ref 101–111)
Glucose, Bld: 117 mg/dL — ABNORMAL HIGH (ref 65–99)
Potassium: 4.1 mmol/L (ref 3.5–5.1)
Sodium: 140 mmol/L (ref 135–145)
Total Bilirubin: 1.8 mg/dL — ABNORMAL HIGH (ref 0.3–1.2)
Total Protein: 5.9 g/dL — ABNORMAL LOW (ref 6.5–8.1)

## 2017-09-16 LAB — CBC
HCT: 30.1 % — ABNORMAL LOW (ref 39.0–52.0)
HEMOGLOBIN: 9 g/dL — AB (ref 13.0–17.0)
MCH: 30 pg (ref 26.0–34.0)
MCHC: 29.9 g/dL — ABNORMAL LOW (ref 30.0–36.0)
MCV: 100.3 fL — ABNORMAL HIGH (ref 78.0–100.0)
Platelets: 115 10*3/uL — ABNORMAL LOW (ref 150–400)
RBC: 3 MIL/uL — AB (ref 4.22–5.81)
RDW: 22.7 % — ABNORMAL HIGH (ref 11.5–15.5)
WBC: 10.7 10*3/uL — ABNORMAL HIGH (ref 4.0–10.5)

## 2017-09-16 LAB — APTT: aPTT: 24 seconds (ref 24–36)

## 2017-09-16 LAB — SURGICAL PCR SCREEN
MRSA, PCR: NEGATIVE
STAPHYLOCOCCUS AUREUS: NEGATIVE

## 2017-09-16 LAB — HEMOGLOBIN A1C
Hgb A1c MFr Bld: 4.1 % — ABNORMAL LOW (ref 4.8–5.6)
MEAN PLASMA GLUCOSE: 70.97 mg/dL

## 2017-09-16 MED ORDER — POTASSIUM CHLORIDE 2 MEQ/ML IV SOLN
80.0000 meq | INTRAVENOUS | Status: DC
  Filled 2017-09-16: qty 40

## 2017-09-16 MED ORDER — SODIUM CHLORIDE 0.9 % IV SOLN
INTRAVENOUS | Status: AC
  Administered 2017-09-17: 1 [IU]/h via INTRAVENOUS
  Filled 2017-09-16: qty 1

## 2017-09-16 MED ORDER — EPINEPHRINE PF 1 MG/ML IJ SOLN
0.0000 ug/min | INTRAVENOUS | Status: DC
  Filled 2017-09-16: qty 4

## 2017-09-16 MED ORDER — SODIUM CHLORIDE 0.9 % IV SOLN
750.0000 mg | INTRAVENOUS | Status: DC
  Filled 2017-09-16: qty 750

## 2017-09-16 MED ORDER — SODIUM CHLORIDE 0.9 % IV SOLN
1.5000 g | INTRAVENOUS | Status: DC
  Filled 2017-09-16: qty 1.5

## 2017-09-16 MED ORDER — DOPAMINE-DEXTROSE 3.2-5 MG/ML-% IV SOLN
0.0000 ug/kg/min | INTRAVENOUS | Status: AC
  Administered 2017-09-17: 3 ug/kg/min via INTRAVENOUS
  Filled 2017-09-16: qty 250

## 2017-09-16 MED ORDER — MAGNESIUM SULFATE 50 % IJ SOLN
40.0000 meq | INTRAMUSCULAR | Status: DC
  Filled 2017-09-16: qty 9.85

## 2017-09-16 MED ORDER — KENNESTONE BLOOD CARDIOPLEGIA (KBC) MANNITOL SYRINGE (20%, 32ML)
32.0000 mL | Freq: Once | INTRAVENOUS | Status: DC
  Filled 2017-09-16: qty 32

## 2017-09-16 MED ORDER — TRANEXAMIC ACID (OHS) PUMP PRIME SOLUTION
2.0000 mg/kg | INTRAVENOUS | Status: DC
  Filled 2017-09-16: qty 1.49

## 2017-09-16 MED ORDER — PLASMA-LYTE 148 IV SOLN
INTRAVENOUS | Status: DC
  Filled 2017-09-16: qty 2.5

## 2017-09-16 MED ORDER — KENNESTONE BLOOD CARDIOPLEGIA VIAL
13.0000 mL | Freq: Once | Status: DC
  Filled 2017-09-16: qty 13

## 2017-09-16 MED ORDER — DEXMEDETOMIDINE HCL IN NACL 400 MCG/100ML IV SOLN
0.1000 ug/kg/h | INTRAVENOUS | Status: AC
  Administered 2017-09-17: .3 ug/kg/h via INTRAVENOUS
  Filled 2017-09-16: qty 100

## 2017-09-16 MED ORDER — NITROGLYCERIN IN D5W 200-5 MCG/ML-% IV SOLN
2.0000 ug/min | INTRAVENOUS | Status: DC
  Filled 2017-09-16: qty 250

## 2017-09-16 MED ORDER — TRANEXAMIC ACID (OHS) BOLUS VIA INFUSION
15.0000 mg/kg | INTRAVENOUS | Status: AC
  Administered 2017-09-17: 1117.5 mg via INTRAVENOUS
  Filled 2017-09-16: qty 1118

## 2017-09-16 MED ORDER — SODIUM CHLORIDE 0.9 % IV SOLN
INTRAVENOUS | Status: DC
  Filled 2017-09-16: qty 30

## 2017-09-16 MED ORDER — TRANEXAMIC ACID 1000 MG/10ML IV SOLN
1.5000 mg/kg/h | INTRAVENOUS | Status: AC
  Administered 2017-09-17: 1.5 mg/kg/h via INTRAVENOUS
  Filled 2017-09-16: qty 25

## 2017-09-16 MED ORDER — MILRINONE LACTATE IN DEXTROSE 20-5 MG/100ML-% IV SOLN
0.1250 ug/kg/min | INTRAVENOUS | Status: DC
  Filled 2017-09-16: qty 100

## 2017-09-16 MED ORDER — VANCOMYCIN HCL 10 G IV SOLR
1250.0000 mg | INTRAVENOUS | Status: DC
  Filled 2017-09-16: qty 1250

## 2017-09-16 MED ORDER — SODIUM CHLORIDE 0.9 % IV SOLN
30.0000 ug/min | INTRAVENOUS | Status: AC
  Administered 2017-09-17: 20 ug/min via INTRAVENOUS
  Filled 2017-09-16: qty 20

## 2017-09-16 NOTE — Progress Notes (Signed)
Pt denies any acute cardiopulmonary issues. Pt under the care of Dr. Rennis Golden, Cardiology. Pt denies having a stress test. Spoke with Alycia Rossetti, RN, to make MD aware that pt Hemoglobin was 9.0 and UA had rare bacteria with no nitrates or leukocytes.

## 2017-09-16 NOTE — Progress Notes (Signed)
Dr. Arby Barrette, Anesthesia, made aware of pt hemoglobin and that MD's office was notified; no new orders.

## 2017-09-17 ENCOUNTER — Encounter (HOSPITAL_COMMUNITY): Payer: Self-pay | Admitting: *Deleted

## 2017-09-17 ENCOUNTER — Inpatient Hospital Stay (HOSPITAL_COMMUNITY)
Admission: RE | Admit: 2017-09-17 | Discharge: 2017-09-21 | DRG: 220 | Disposition: A | Discharge status: Home / Self Care | Payer: Commercial Managed Care - PPO | Source: Ambulatory Visit | Attending: Thoracic Surgery (Cardiothoracic Vascular Surgery) | Admitting: Thoracic Surgery (Cardiothoracic Vascular Surgery)

## 2017-09-17 ENCOUNTER — Inpatient Hospital Stay (HOSPITAL_COMMUNITY): Payer: Commercial Managed Care - PPO

## 2017-09-17 ENCOUNTER — Inpatient Hospital Stay (HOSPITAL_COMMUNITY): Payer: Commercial Managed Care - PPO | Admitting: Certified Registered Nurse Anesthetist

## 2017-09-17 ENCOUNTER — Encounter (HOSPITAL_COMMUNITY)
Admission: RE | Disposition: A | Discharge status: Home / Self Care | Payer: Self-pay | Source: Ambulatory Visit | Attending: Thoracic Surgery (Cardiothoracic Vascular Surgery)

## 2017-09-17 DIAGNOSIS — D589 Hereditary hemolytic anemia, unspecified: Secondary | ICD-10-CM | POA: Diagnosis present

## 2017-09-17 DIAGNOSIS — I351 Nonrheumatic aortic (valve) insufficiency: Secondary | ICD-10-CM | POA: Diagnosis not present

## 2017-09-17 DIAGNOSIS — D62 Acute posthemorrhagic anemia: Secondary | ICD-10-CM | POA: Diagnosis not present

## 2017-09-17 DIAGNOSIS — R059 Cough, unspecified: Secondary | ICD-10-CM

## 2017-09-17 DIAGNOSIS — I10 Essential (primary) hypertension: Secondary | ICD-10-CM | POA: Diagnosis present

## 2017-09-17 DIAGNOSIS — Z79899 Other long term (current) drug therapy: Secondary | ICD-10-CM | POA: Diagnosis not present

## 2017-09-17 DIAGNOSIS — Z4509 Encounter for adjustment and management of other cardiac device: Secondary | ICD-10-CM | POA: Diagnosis not present

## 2017-09-17 DIAGNOSIS — M19021 Primary osteoarthritis, right elbow: Secondary | ICD-10-CM | POA: Diagnosis present

## 2017-09-17 DIAGNOSIS — J9811 Atelectasis: Secondary | ICD-10-CM | POA: Diagnosis not present

## 2017-09-17 DIAGNOSIS — E8770 Fluid overload, unspecified: Secondary | ICD-10-CM | POA: Diagnosis not present

## 2017-09-17 DIAGNOSIS — I2119 ST elevation (STEMI) myocardial infarction involving other coronary artery of inferior wall: Secondary | ICD-10-CM | POA: Diagnosis not present

## 2017-09-17 DIAGNOSIS — Z7952 Long term (current) use of systemic steroids: Secondary | ICD-10-CM | POA: Diagnosis not present

## 2017-09-17 DIAGNOSIS — D649 Anemia, unspecified: Secondary | ICD-10-CM

## 2017-09-17 DIAGNOSIS — E785 Hyperlipidemia, unspecified: Secondary | ICD-10-CM | POA: Diagnosis present

## 2017-09-17 DIAGNOSIS — R05 Cough: Secondary | ICD-10-CM

## 2017-09-17 DIAGNOSIS — K2100 Gastro-esophageal reflux disease with esophagitis, without bleeding: Secondary | ICD-10-CM

## 2017-09-17 DIAGNOSIS — Z952 Presence of prosthetic heart valve: Secondary | ICD-10-CM

## 2017-09-17 DIAGNOSIS — M19022 Primary osteoarthritis, left elbow: Secondary | ICD-10-CM | POA: Diagnosis present

## 2017-09-17 DIAGNOSIS — I38 Endocarditis, valve unspecified: Secondary | ICD-10-CM | POA: Diagnosis present

## 2017-09-17 DIAGNOSIS — M19011 Primary osteoarthritis, right shoulder: Secondary | ICD-10-CM | POA: Diagnosis present

## 2017-09-17 DIAGNOSIS — M19012 Primary osteoarthritis, left shoulder: Secondary | ICD-10-CM | POA: Diagnosis present

## 2017-09-17 DIAGNOSIS — T8203XA Leakage of heart valve prosthesis, initial encounter: Secondary | ICD-10-CM | POA: Diagnosis present

## 2017-09-17 DIAGNOSIS — I350 Nonrheumatic aortic (valve) stenosis: Secondary | ICD-10-CM

## 2017-09-17 DIAGNOSIS — Z7982 Long term (current) use of aspirin: Secondary | ICD-10-CM | POA: Diagnosis not present

## 2017-09-17 DIAGNOSIS — D696 Thrombocytopenia, unspecified: Secondary | ICD-10-CM | POA: Diagnosis present

## 2017-09-17 DIAGNOSIS — I081 Rheumatic disorders of both mitral and tricuspid valves: Secondary | ICD-10-CM | POA: Diagnosis not present

## 2017-09-17 DIAGNOSIS — I319 Disease of pericardium, unspecified: Secondary | ICD-10-CM | POA: Diagnosis not present

## 2017-09-17 DIAGNOSIS — K219 Gastro-esophageal reflux disease without esophagitis: Secondary | ICD-10-CM | POA: Diagnosis not present

## 2017-09-17 DIAGNOSIS — Q2381 Bicuspid aortic valve: Secondary | ICD-10-CM

## 2017-09-17 DIAGNOSIS — M329 Systemic lupus erythematosus, unspecified: Secondary | ICD-10-CM | POA: Diagnosis present

## 2017-09-17 DIAGNOSIS — Q231 Congenital insufficiency of aortic valve: Secondary | ICD-10-CM

## 2017-09-17 DIAGNOSIS — T380X5A Adverse effect of glucocorticoids and synthetic analogues, initial encounter: Secondary | ICD-10-CM | POA: Diagnosis not present

## 2017-09-17 DIAGNOSIS — L930 Discoid lupus erythematosus: Secondary | ICD-10-CM

## 2017-09-17 DIAGNOSIS — D500 Iron deficiency anemia secondary to blood loss (chronic): Secondary | ICD-10-CM

## 2017-09-17 HISTORY — DX: Leakage of heart valve prosthesis, initial encounter: T82.03XA

## 2017-09-17 HISTORY — PX: TEE WITHOUT CARDIOVERSION: SHX5443

## 2017-09-17 HISTORY — PX: AORTIC VALVE REPLACEMENT: SHX41

## 2017-09-17 LAB — POCT I-STAT 3, ART BLOOD GAS (G3+)
ACID-BASE EXCESS: 2 mmol/L (ref 0.0–2.0)
Acid-Base Excess: 1 mmol/L (ref 0.0–2.0)
Acid-base deficit: 1 mmol/L (ref 0.0–2.0)
Bicarbonate: 26.3 mmol/L (ref 20.0–28.0)
Bicarbonate: 25.2 mmol/L (ref 20.0–28.0)
Bicarbonate: 24.3 mmol/L (ref 20.0–28.0)
Bicarbonate: 26.8 mmol/L (ref 20.0–28.0)
O2 SAT: 100 %
O2 SAT: 98 %
O2 Saturation: 98 %
O2 Saturation: 100 %
PCO2 ART: 41.6 mmHg (ref 32.0–48.0)
PCO2 ART: 47.2 mmHg (ref 32.0–48.0)
PH ART: 7.409 (ref 7.350–7.450)
PH ART: 7.332 — AB (ref 7.350–7.450)
PH ART: 7.403 (ref 7.350–7.450)
PO2 ART: 174 mmHg — AB (ref 83.0–108.0)
Patient temperature: 37.1
TCO2: 28 mmol/L (ref 22–32)
TCO2: 27 mmol/L (ref 22–32)
TCO2: 26 mmol/L (ref 22–32)
TCO2: 28 mmol/L (ref 22–32)
pCO2 arterial: 39 mmHg (ref 32.0–48.0)
pCO2 arterial: 43.3 mmHg (ref 32.0–48.0)
pH, Arterial: 7.4 (ref 7.350–7.450)
pO2, Arterial: 407 mmHg — ABNORMAL HIGH (ref 83.0–108.0)
pO2, Arterial: 111 mmHg — ABNORMAL HIGH (ref 83.0–108.0)
pO2, Arterial: 112 mmHg — ABNORMAL HIGH (ref 83.0–108.0)

## 2017-09-17 LAB — POCT I-STAT, CHEM 8
BUN: 18 mg/dL (ref 6–20)
BUN: 15 mg/dL (ref 6–20)
BUN: 17 mg/dL (ref 6–20)
BUN: 17 mg/dL (ref 6–20)
BUN: 17 mg/dL (ref 6–20)
BUN: 18 mg/dL (ref 6–20)
CALCIUM ION: 1.25 mmol/L (ref 1.15–1.40)
CALCIUM ION: 0.97 mmol/L — AB (ref 1.15–1.40)
CALCIUM ION: 1.19 mmol/L (ref 1.15–1.40)
CALCIUM ION: 1.06 mmol/L — AB (ref 1.15–1.40)
CHLORIDE: 103 mmol/L (ref 101–111)
CREATININE: 0.7 mg/dL (ref 0.61–1.24)
Calcium, Ion: 1.04 mmol/L — ABNORMAL LOW (ref 1.15–1.40)
Calcium, Ion: 1.03 mmol/L — ABNORMAL LOW (ref 1.15–1.40)
Chloride: 102 mmol/L (ref 101–111)
Chloride: 104 mmol/L (ref 101–111)
Chloride: 103 mmol/L (ref 101–111)
Chloride: 103 mmol/L (ref 101–111)
Chloride: 105 mmol/L (ref 101–111)
Creatinine, Ser: 0.7 mg/dL (ref 0.61–1.24)
Creatinine, Ser: 0.5 mg/dL — ABNORMAL LOW (ref 0.61–1.24)
Creatinine, Ser: 0.6 mg/dL — ABNORMAL LOW (ref 0.61–1.24)
Creatinine, Ser: 0.6 mg/dL — ABNORMAL LOW (ref 0.61–1.24)
Creatinine, Ser: 0.7 mg/dL (ref 0.61–1.24)
GLUCOSE: 137 mg/dL — AB (ref 65–99)
Glucose, Bld: 128 mg/dL — ABNORMAL HIGH (ref 65–99)
Glucose, Bld: 153 mg/dL — ABNORMAL HIGH (ref 65–99)
Glucose, Bld: 165 mg/dL — ABNORMAL HIGH (ref 65–99)
Glucose, Bld: 168 mg/dL — ABNORMAL HIGH (ref 65–99)
Glucose, Bld: 122 mg/dL — ABNORMAL HIGH (ref 65–99)
HCT: 22 % — ABNORMAL LOW (ref 39.0–52.0)
HCT: 24 % — ABNORMAL LOW (ref 39.0–52.0)
HCT: 24 % — ABNORMAL LOW (ref 39.0–52.0)
HCT: 29 % — ABNORMAL LOW (ref 39.0–52.0)
HEMATOCRIT: 19 % — AB (ref 39.0–52.0)
HEMATOCRIT: 22 % — AB (ref 39.0–52.0)
HEMOGLOBIN: 7.5 g/dL — AB (ref 13.0–17.0)
HEMOGLOBIN: 8.2 g/dL — AB (ref 13.0–17.0)
HEMOGLOBIN: 8.2 g/dL — AB (ref 13.0–17.0)
Hemoglobin: 6.5 g/dL — CL (ref 13.0–17.0)
Hemoglobin: 7.5 g/dL — ABNORMAL LOW (ref 13.0–17.0)
Hemoglobin: 9.9 g/dL — ABNORMAL LOW (ref 13.0–17.0)
POTASSIUM: 4.9 mmol/L (ref 3.5–5.1)
POTASSIUM: 5.7 mmol/L — AB (ref 3.5–5.1)
POTASSIUM: 4.9 mmol/L (ref 3.5–5.1)
Potassium: 4.3 mmol/L (ref 3.5–5.1)
Potassium: 4.4 mmol/L (ref 3.5–5.1)
Potassium: 4.3 mmol/L (ref 3.5–5.1)
SODIUM: 140 mmol/L (ref 135–145)
SODIUM: 140 mmol/L (ref 135–145)
SODIUM: 140 mmol/L (ref 135–145)
SODIUM: 135 mmol/L (ref 135–145)
SODIUM: 141 mmol/L (ref 135–145)
Sodium: 139 mmol/L (ref 135–145)
TCO2: 29 mmol/L (ref 22–32)
TCO2: 28 mmol/L (ref 22–32)
TCO2: 28 mmol/L (ref 22–32)
TCO2: 27 mmol/L (ref 22–32)
TCO2: 26 mmol/L (ref 22–32)
TCO2: 25 mmol/L (ref 22–32)

## 2017-09-17 LAB — GLUCOSE, CAPILLARY
GLUCOSE-CAPILLARY: 62 mg/dL — AB (ref 65–99)
GLUCOSE-CAPILLARY: 90 mg/dL (ref 65–99)
Glucose-Capillary: 105 mg/dL — ABNORMAL HIGH (ref 65–99)
Glucose-Capillary: 113 mg/dL — ABNORMAL HIGH (ref 65–99)

## 2017-09-17 LAB — CBC
HCT: 34.1 % — ABNORMAL LOW (ref 39.0–52.0)
HCT: 30.5 % — ABNORMAL LOW (ref 39.0–52.0)
HEMOGLOBIN: 10.7 g/dL — AB (ref 13.0–17.0)
HEMOGLOBIN: 9.7 g/dL — AB (ref 13.0–17.0)
MCH: 30.1 pg (ref 26.0–34.0)
MCH: 30.5 pg (ref 26.0–34.0)
MCHC: 31.4 g/dL (ref 30.0–36.0)
MCHC: 31.8 g/dL (ref 30.0–36.0)
MCV: 96.1 fL (ref 78.0–100.0)
MCV: 95.9 fL (ref 78.0–100.0)
PLATELETS: 106 10*3/uL — AB (ref 150–400)
Platelets: 108 10*3/uL — ABNORMAL LOW (ref 150–400)
RBC: 3.55 MIL/uL — AB (ref 4.22–5.81)
RBC: 3.18 MIL/uL — ABNORMAL LOW (ref 4.22–5.81)
RDW: 20.8 % — ABNORMAL HIGH (ref 11.5–15.5)
RDW: 21.2 % — ABNORMAL HIGH (ref 11.5–15.5)
WBC: 23.3 10*3/uL — ABNORMAL HIGH (ref 4.0–10.5)
WBC: 17.5 10*3/uL — ABNORMAL HIGH (ref 4.0–10.5)

## 2017-09-17 LAB — CREATININE, SERUM
Creatinine, Ser: 0.78 mg/dL (ref 0.61–1.24)
GFR calc non Af Amer: >60 mL/min (ref >60)

## 2017-09-17 LAB — APTT: aPTT: 30 seconds (ref 24–36)

## 2017-09-17 LAB — PLATELET COUNT: PLATELETS: 77 10*3/uL — AB (ref 150–400)

## 2017-09-17 LAB — PREPARE RBC (CROSSMATCH)

## 2017-09-17 LAB — PROTIME-INR
INR: 1.58
PROTHROMBIN TIME: 18.7 s — AB (ref 11.4–15.2)

## 2017-09-17 LAB — HEMOGLOBIN AND HEMATOCRIT, BLOOD
HCT: 27.6 % — ABNORMAL LOW (ref 39.0–52.0)
Hemoglobin: 8.7 g/dL — ABNORMAL LOW (ref 13.0–17.0)

## 2017-09-17 LAB — MAGNESIUM: MAGNESIUM: 3.7 mg/dL — AB (ref 1.7–2.4)

## 2017-09-17 SURGERY — REDO AORTIC VALVE REPLACEMENT (AVR)
Anesthesia: General

## 2017-09-17 MED ORDER — ASPIRIN 81 MG PO CHEW: 324.0000 mg | CHEWABLE_TABLET | Freq: Every day | ORAL | Status: DC

## 2017-09-17 MED ORDER — ACETAMINOPHEN 650 MG RE SUPP
650.0000 mg | Freq: Once | RECTAL | Status: AC
  Administered 2017-09-17: 650 mg via RECTAL

## 2017-09-17 MED ORDER — TAMSULOSIN HCL 0.4 MG PO CAPS
0.4000 mg | ORAL_CAPSULE | Freq: Every day | ORAL | Status: DC
  Administered 2017-09-18 – 2017-09-21 (×4): 0.4 mg via ORAL
  Filled 2017-09-17 (×4): qty 1

## 2017-09-17 MED ORDER — PREDNISONE 20 MG PO TABS
20.0000 mg | ORAL_TABLET | Freq: Every day | ORAL | Status: DC
  Administered 2017-09-19 – 2017-09-21 (×3): 20 mg via ORAL
  Filled 2017-09-17 (×3): qty 1

## 2017-09-17 MED ORDER — SCOPOLAMINE 1 MG/3DAYS TD PT72
MEDICATED_PATCH | TRANSDERMAL | Status: DC | PRN
  Administered 2017-09-17: 1 via TRANSDERMAL

## 2017-09-17 MED ORDER — SODIUM CHLORIDE 0.9% FLUSH: 3.0000 mL | INTRAVENOUS | Status: DC | PRN

## 2017-09-17 MED ORDER — HEPARIN SODIUM (PORCINE) 1000 UNIT/ML IJ SOLN
INTRAMUSCULAR | Status: AC
  Filled 2017-09-17: qty 1

## 2017-09-17 MED ORDER — HEPARIN SODIUM (PORCINE) 1000 UNIT/ML IJ SOLN
INTRAMUSCULAR | Status: DC | PRN
  Administered 2017-09-17: 25000 [IU] via INTRAVENOUS

## 2017-09-17 MED ORDER — LACTATED RINGERS IV SOLN
INTRAVENOUS | Status: DC
  Administered 2017-09-17: 09:00:00 via INTRAVENOUS

## 2017-09-17 MED ORDER — SODIUM CHLORIDE 0.9 % IV SOLN: Freq: Once | INTRAVENOUS | Status: DC

## 2017-09-17 MED ORDER — ACETAMINOPHEN 500 MG PO TABS
1000.0000 mg | ORAL_TABLET | Freq: Four times a day (QID) | ORAL | Status: DC
  Administered 2017-09-17 – 2017-09-21 (×14): 1000 mg via ORAL
  Filled 2017-09-17 (×15): qty 2

## 2017-09-17 MED ORDER — MIDAZOLAM HCL 2 MG/2ML IJ SOLN
2.0000 mg | Freq: Once | INTRAMUSCULAR | Status: AC
  Administered 2017-09-17: 2 mg via INTRAVENOUS

## 2017-09-17 MED ORDER — LACTATED RINGERS IV SOLN
INTRAVENOUS | Status: DC | PRN
  Administered 2017-09-17: 09:00:00 via INTRAVENOUS

## 2017-09-17 MED ORDER — FENTANYL CITRATE (PF) 100 MCG/2ML IJ SOLN
INTRAMUSCULAR | Status: AC
  Administered 2017-09-17: 100 ug via INTRAVENOUS
  Filled 2017-09-17: qty 2

## 2017-09-17 MED ORDER — METHYLPREDNISOLONE SODIUM SUCC 125 MG IJ SOLR
60.0000 mg | Freq: Four times a day (QID) | INTRAMUSCULAR | Status: AC
  Administered 2017-09-17 – 2017-09-19 (×7): 60 mg via INTRAVENOUS
  Filled 2017-09-17 (×7): qty 2

## 2017-09-17 MED ORDER — ORAL CARE MOUTH RINSE: 15.0000 mL | Freq: Two times a day (BID) | OROMUCOSAL | Status: DC

## 2017-09-17 MED ORDER — METOPROLOL TARTRATE 12.5 MG HALF TABLET: 12.5000 mg | ORAL_TABLET | Freq: Once | ORAL | Status: DC

## 2017-09-17 MED ORDER — ASPIRIN EC 325 MG PO TBEC
325.0000 mg | DELAYED_RELEASE_TABLET | Freq: Every day | ORAL | Status: DC
  Administered 2017-09-18 – 2017-09-21 (×4): 325 mg via ORAL
  Filled 2017-09-17 (×4): qty 1

## 2017-09-17 MED ORDER — PROTAMINE SULFATE 10 MG/ML IV SOLN
INTRAVENOUS | Status: AC
  Filled 2017-09-17: qty 25

## 2017-09-17 MED ORDER — 0.9 % SODIUM CHLORIDE (POUR BTL) OPTIME
TOPICAL | Status: DC | PRN
  Administered 2017-09-17: 5000 mL

## 2017-09-17 MED ORDER — SODIUM CHLORIDE 0.9% FLUSH
3.0000 mL | Freq: Two times a day (BID) | INTRAVENOUS | Status: DC
  Administered 2017-09-18 – 2017-09-21 (×5): 3 mL via INTRAVENOUS

## 2017-09-17 MED ORDER — MIDAZOLAM HCL 10 MG/2ML IJ SOLN
INTRAMUSCULAR | Status: AC
  Filled 2017-09-17: qty 2

## 2017-09-17 MED ORDER — BISACODYL 5 MG PO TBEC
10.0000 mg | DELAYED_RELEASE_TABLET | Freq: Every day | ORAL | Status: DC
  Administered 2017-09-18 – 2017-09-20 (×3): 10 mg via ORAL
  Filled 2017-09-17 (×4): qty 2

## 2017-09-17 MED ORDER — METOPROLOL TARTRATE 25 MG/10 ML ORAL SUSPENSION: 12.5000 mg | Freq: Two times a day (BID) | ORAL | Status: DC

## 2017-09-17 MED ORDER — CHLORHEXIDINE GLUCONATE CLOTH 2 % EX PADS
6.0000 | MEDICATED_PAD | Freq: Every day | CUTANEOUS | Status: DC
  Administered 2017-09-18: 6 via TOPICAL

## 2017-09-17 MED ORDER — PHENYLEPHRINE 40 MCG/ML (10ML) SYRINGE FOR IV PUSH (FOR BLOOD PRESSURE SUPPORT)
PREFILLED_SYRINGE | INTRAVENOUS | Status: AC
  Filled 2017-09-17: qty 10

## 2017-09-17 MED ORDER — ARTIFICIAL TEARS OPHTHALMIC OINT
TOPICAL_OINTMENT | OPHTHALMIC | Status: DC | PRN
  Administered 2017-09-17: 1 via OPHTHALMIC

## 2017-09-17 MED ORDER — MIDAZOLAM HCL 2 MG/2ML IJ SOLN
INTRAMUSCULAR | Status: AC
  Administered 2017-09-17: 2 mg via INTRAVENOUS
  Filled 2017-09-17: qty 2

## 2017-09-17 MED ORDER — SODIUM CHLORIDE 0.9 % IJ SOLN
OROMUCOSAL | Status: DC | PRN
  Administered 2017-09-17 (×3): via TOPICAL

## 2017-09-17 MED ORDER — ACETAMINOPHEN 160 MG/5ML PO SOLN: 1000.0000 mg | Freq: Four times a day (QID) | ORAL | Status: DC

## 2017-09-17 MED ORDER — CHLORHEXIDINE GLUCONATE 0.12 % MT SOLN
OROMUCOSAL | Status: AC
  Administered 2017-09-17: 15 mL
  Filled 2017-09-17: qty 15

## 2017-09-17 MED ORDER — SODIUM CHLORIDE 0.9 % IV SOLN
INTRAVENOUS | Status: DC
  Administered 2017-09-17: 16:00:00 via INTRAVENOUS

## 2017-09-17 MED ORDER — SODIUM CHLORIDE 0.9 % IV SOLN
INTRAVENOUS | Status: DC
  Filled 2017-09-17: qty 1

## 2017-09-17 MED ORDER — DOPAMINE-DEXTROSE 3.2-5 MG/ML-% IV SOLN: 0.0000 ug/kg/min | INTRAVENOUS | Status: DC

## 2017-09-17 MED ORDER — METOPROLOL TARTRATE 12.5 MG HALF TABLET
12.5000 mg | ORAL_TABLET | Freq: Two times a day (BID) | ORAL | Status: DC
  Administered 2017-09-18 – 2017-09-19 (×4): 12.5 mg via ORAL
  Filled 2017-09-17 (×4): qty 1

## 2017-09-17 MED ORDER — METOPROLOL TARTRATE 5 MG/5ML IV SOLN: 2.5000 mg | INTRAVENOUS | Status: DC | PRN

## 2017-09-17 MED ORDER — DEXTROSE 50 % IV SOLN
INTRAVENOUS | Status: AC
  Filled 2017-09-17: qty 50

## 2017-09-17 MED ORDER — POTASSIUM CHLORIDE 10 MEQ/50ML IV SOLN: 10.0000 meq | INTRAVENOUS | Status: AC

## 2017-09-17 MED ORDER — PREDNISONE 20 MG PO TABS: 20.0000 mg | ORAL_TABLET | Freq: Every day | ORAL | Status: DC

## 2017-09-17 MED ORDER — HEMOSTATIC AGENTS (NO CHARGE) OPTIME
TOPICAL | Status: DC | PRN
  Administered 2017-09-17: 1 via TOPICAL

## 2017-09-17 MED ORDER — SODIUM CHLORIDE 0.9 % IV SOLN
INTRAVENOUS | Status: DC | PRN
  Administered 2017-09-17: 15:00:00 via INTRAVENOUS

## 2017-09-17 MED ORDER — NITROGLYCERIN IN D5W 200-5 MCG/ML-% IV SOLN: 0.0000 ug/min | INTRAVENOUS | Status: DC

## 2017-09-17 MED ORDER — ROCURONIUM BROMIDE 10 MG/ML (PF) SYRINGE
PREFILLED_SYRINGE | INTRAVENOUS | Status: AC
  Filled 2017-09-17: qty 10

## 2017-09-17 MED ORDER — MAGNESIUM SULFATE 4 GM/100ML IV SOLN
4.0000 g | Freq: Once | INTRAVENOUS | Status: AC
  Administered 2017-09-17: 4 g via INTRAVENOUS
  Filled 2017-09-17: qty 100

## 2017-09-17 MED ORDER — INSULIN REGULAR BOLUS VIA INFUSION
0.0000 [IU] | Freq: Three times a day (TID) | INTRAVENOUS | Status: DC
  Filled 2017-09-17: qty 10

## 2017-09-17 MED ORDER — SODIUM CHLORIDE 0.9 % IV SOLN
INTRAVENOUS | Status: DC | PRN
  Administered 2017-09-17: 1.5 g via INTRAVENOUS

## 2017-09-17 MED ORDER — SODIUM CHLORIDE 0.9 % IV SOLN
0.0000 ug/min | INTRAVENOUS | Status: DC
  Filled 2017-09-17: qty 2

## 2017-09-17 MED ORDER — ROCURONIUM BROMIDE 10 MG/ML (PF) SYRINGE
PREFILLED_SYRINGE | INTRAVENOUS | Status: AC
  Filled 2017-09-17: qty 5

## 2017-09-17 MED ORDER — LACTATED RINGERS IV SOLN: 500.0000 mL | Freq: Once | INTRAVENOUS | Status: DC | PRN

## 2017-09-17 MED ORDER — TRAMADOL HCL 50 MG PO TABS
50.0000 mg | ORAL_TABLET | ORAL | Status: DC | PRN
  Administered 2017-09-18 – 2017-09-19 (×3): 50 mg via ORAL
  Administered 2017-09-19 – 2017-09-21 (×4): 100 mg via ORAL
  Filled 2017-09-17: qty 2
  Filled 2017-09-17 (×2): qty 1
  Filled 2017-09-17 (×3): qty 2
  Filled 2017-09-17: qty 1

## 2017-09-17 MED ORDER — SCOPOLAMINE 1 MG/3DAYS TD PT72
1.0000 | MEDICATED_PATCH | TRANSDERMAL | Status: DC
  Administered 2017-09-17: 1.5 mg via TRANSDERMAL
  Filled 2017-09-17 (×2): qty 1

## 2017-09-17 MED ORDER — MIDAZOLAM HCL 2 MG/2ML IJ SOLN: 2.0000 mg | INTRAMUSCULAR | Status: DC | PRN

## 2017-09-17 MED ORDER — MIDAZOLAM HCL 5 MG/5ML IJ SOLN
INTRAMUSCULAR | Status: DC | PRN
  Administered 2017-09-17 (×5): 2 mg via INTRAVENOUS

## 2017-09-17 MED ORDER — SODIUM CHLORIDE 0.45 % IV SOLN
INTRAVENOUS | Status: DC | PRN
  Administered 2017-09-17: 16:00:00 via INTRAVENOUS

## 2017-09-17 MED ORDER — CHLORHEXIDINE GLUCONATE 0.12 % MT SOLN
15.0000 mL | OROMUCOSAL | Status: AC
  Administered 2017-09-17: 15 mL via OROMUCOSAL

## 2017-09-17 MED ORDER — SODIUM CHLORIDE 0.9 % IV SOLN
0.0000 ug/kg/h | INTRAVENOUS | Status: DC
  Filled 2017-09-17: qty 2

## 2017-09-17 MED ORDER — SODIUM CHLORIDE 0.9% FLUSH
10.0000 mL | INTRAVENOUS | Status: DC | PRN
  Administered 2017-09-19: 10 mL
  Filled 2017-09-17: qty 40

## 2017-09-17 MED ORDER — MORPHINE SULFATE (PF) 4 MG/ML IV SOLN: 1.0000 mg | INTRAVENOUS | Status: DC | PRN

## 2017-09-17 MED ORDER — INSULIN ASPART 100 UNIT/ML ~~LOC~~ SOLN
0.0000 [IU] | SUBCUTANEOUS | Status: DC
  Administered 2017-09-18: 2 [IU] via SUBCUTANEOUS
  Administered 2017-09-18: 4 [IU] via SUBCUTANEOUS
  Administered 2017-09-18: 2 [IU] via SUBCUTANEOUS
  Administered 2017-09-18: 8 [IU] via SUBCUTANEOUS
  Administered 2017-09-18: 2 [IU] via SUBCUTANEOUS
  Administered 2017-09-18 (×2): 4 [IU] via SUBCUTANEOUS
  Administered 2017-09-19: 8 [IU] via SUBCUTANEOUS
  Administered 2017-09-19: 4 [IU] via SUBCUTANEOUS

## 2017-09-17 MED ORDER — LACTATED RINGERS IV SOLN: INTRAVENOUS | Status: DC

## 2017-09-17 MED ORDER — ONDANSETRON HCL 4 MG/2ML IJ SOLN: 4.0000 mg | Freq: Four times a day (QID) | INTRAMUSCULAR | Status: DC | PRN

## 2017-09-17 MED ORDER — FAMOTIDINE 20 MG IN NS 100 ML IVPB
20.0000 mg | Freq: Two times a day (BID) | INTRAVENOUS | Status: DC
  Administered 2017-09-17: 20 mg via INTRAVENOUS
  Filled 2017-09-17 (×2): qty 100

## 2017-09-17 MED ORDER — ROCURONIUM BROMIDE 10 MG/ML (PF) SYRINGE
PREFILLED_SYRINGE | INTRAVENOUS | Status: DC | PRN
  Administered 2017-09-17: 50 mg via INTRAVENOUS
  Administered 2017-09-17: 100 mg via INTRAVENOUS
  Administered 2017-09-17: 50 mg via INTRAVENOUS
  Administered 2017-09-17: 30 mg via INTRAVENOUS

## 2017-09-17 MED ORDER — ALBUMIN HUMAN 5 % IV SOLN
250.0000 mL | INTRAVENOUS | Status: DC | PRN
  Administered 2017-09-17 (×2): 250 mL via INTRAVENOUS

## 2017-09-17 MED ORDER — SODIUM CHLORIDE 0.9 % IV SOLN
INTRAVENOUS | Status: DC | PRN
  Administered 2017-09-17: 750 mg via INTRAVENOUS

## 2017-09-17 MED ORDER — PANTOPRAZOLE SODIUM 40 MG PO TBEC
40.0000 mg | DELAYED_RELEASE_TABLET | Freq: Every day | ORAL | Status: DC
  Administered 2017-09-19 – 2017-09-21 (×3): 40 mg via ORAL
  Filled 2017-09-17 (×3): qty 1

## 2017-09-17 MED ORDER — VANCOMYCIN HCL 1000 MG IV SOLR
INTRAVENOUS | Status: DC | PRN
  Administered 2017-09-17: 1250 mg via INTRAVENOUS

## 2017-09-17 MED ORDER — SODIUM CHLORIDE 0.9% FLUSH
10.0000 mL | Freq: Two times a day (BID) | INTRAVENOUS | Status: DC
  Administered 2017-09-17 – 2017-09-19 (×3): 10 mL

## 2017-09-17 MED ORDER — LACTATED RINGERS IV SOLN
INTRAVENOUS | Status: DC
  Administered 2017-09-17 – 2017-09-18 (×2): via INTRAVENOUS

## 2017-09-17 MED ORDER — CHLORHEXIDINE GLUCONATE 4 % EX LIQD: 30.0000 mL | CUTANEOUS | Status: DC

## 2017-09-17 MED ORDER — DOCUSATE SODIUM 100 MG PO CAPS
200.0000 mg | ORAL_CAPSULE | Freq: Every day | ORAL | Status: DC
  Administered 2017-09-18 – 2017-09-21 (×4): 200 mg via ORAL
  Filled 2017-09-17 (×4): qty 2

## 2017-09-17 MED ORDER — OMEGA-3-ACID ETHYL ESTERS 1 G PO CAPS
1.0000 g | ORAL_CAPSULE | Freq: Every day | ORAL | Status: DC
  Administered 2017-09-19 – 2017-09-21 (×3): 1 g via ORAL
  Filled 2017-09-17 (×3): qty 1

## 2017-09-17 MED ORDER — MORPHINE SULFATE (PF) 4 MG/ML IV SOLN
2.0000 mg | INTRAVENOUS | Status: DC | PRN
  Administered 2017-09-18 (×3): 2 mg via INTRAVENOUS
  Filled 2017-09-17 (×3): qty 1

## 2017-09-17 MED ORDER — DEXTROSE 50 % IV SOLN
15.0000 mL | Freq: Once | INTRAVENOUS | Status: AC
  Administered 2017-09-17: 15 mL via INTRAVENOUS

## 2017-09-17 MED ORDER — PROPOFOL 10 MG/ML IV BOLUS
INTRAVENOUS | Status: DC | PRN
  Administered 2017-09-17: 100 mg via INTRAVENOUS

## 2017-09-17 MED ORDER — FENTANYL CITRATE (PF) 100 MCG/2ML IJ SOLN
100.0000 ug | Freq: Once | INTRAMUSCULAR | Status: AC
  Administered 2017-09-17: 100 ug via INTRAVENOUS

## 2017-09-17 MED ORDER — ACETAMINOPHEN 160 MG/5ML PO SOLN: 650.0000 mg | Freq: Once | ORAL | Status: AC

## 2017-09-17 MED ORDER — SODIUM CHLORIDE 0.9 % IV SOLN: 250.0000 mL | INTRAVENOUS | Status: DC

## 2017-09-17 MED ORDER — PROTAMINE SULFATE 10 MG/ML IV SOLN
INTRAVENOUS | Status: DC | PRN
  Administered 2017-09-17: 250 mg via INTRAVENOUS

## 2017-09-17 MED ORDER — CHLORHEXIDINE GLUCONATE 0.12 % MT SOLN: 15.0000 mL | Freq: Once | OROMUCOSAL | Status: DC

## 2017-09-17 MED ORDER — PROPOFOL 10 MG/ML IV BOLUS
INTRAVENOUS | Status: AC
  Filled 2017-09-17: qty 20

## 2017-09-17 MED ORDER — CHLORHEXIDINE GLUCONATE 0.12 % MT SOLN
15.0000 mL | Freq: Two times a day (BID) | OROMUCOSAL | Status: DC
  Administered 2017-09-17 – 2017-09-18 (×2): 15 mL via OROMUCOSAL
  Filled 2017-09-17 (×2): qty 15

## 2017-09-17 MED ORDER — VANCOMYCIN HCL IN DEXTROSE 1-5 GM/200ML-% IV SOLN
1000.0000 mg | Freq: Once | INTRAVENOUS | Status: AC
  Administered 2017-09-17: 1000 mg via INTRAVENOUS
  Filled 2017-09-17: qty 200

## 2017-09-17 MED ORDER — SODIUM CHLORIDE 0.9 % IV SOLN
1.5000 g | Freq: Two times a day (BID) | INTRAVENOUS | Status: AC
  Administered 2017-09-17 – 2017-09-19 (×4): 1.5 g via INTRAVENOUS
  Filled 2017-09-17 (×4): qty 1.5

## 2017-09-17 MED ORDER — MORPHINE SULFATE (PF) 2 MG/ML IV SOLN: 1.0000 mg | INTRAVENOUS | Status: DC | PRN

## 2017-09-17 MED ORDER — SCOPOLAMINE 1 MG/3DAYS TD PT72
MEDICATED_PATCH | TRANSDERMAL | Status: AC
  Filled 2017-09-17: qty 1

## 2017-09-17 MED ORDER — BISACODYL 10 MG RE SUPP: 10.0000 mg | Freq: Every day | RECTAL | Status: DC

## 2017-09-17 MED ORDER — FENTANYL CITRATE (PF) 250 MCG/5ML IJ SOLN
INTRAMUSCULAR | Status: DC | PRN
  Administered 2017-09-17 (×3): 100 ug via INTRAVENOUS
  Administered 2017-09-17: 300 ug via INTRAVENOUS
  Administered 2017-09-17: 100 ug via INTRAVENOUS
  Administered 2017-09-17: 150 ug via INTRAVENOUS
  Administered 2017-09-17: 50 ug via INTRAVENOUS
  Administered 2017-09-17: 150 ug via INTRAVENOUS
  Administered 2017-09-17 (×2): 100 ug via INTRAVENOUS

## 2017-09-17 MED ORDER — FENTANYL CITRATE (PF) 250 MCG/5ML IJ SOLN
INTRAMUSCULAR | Status: AC
Start: 2017-09-17 — End: 2017-09-17
  Filled 2017-09-17: qty 25

## 2017-09-17 MED ORDER — MORPHINE SULFATE (PF) 2 MG/ML IV SOLN: 2.0000 mg | INTRAVENOUS | Status: DC | PRN

## 2017-09-17 MED ORDER — ATORVASTATIN CALCIUM 20 MG PO TABS
20.0000 mg | ORAL_TABLET | Freq: Every day | ORAL | Status: DC
  Administered 2017-09-18 – 2017-09-21 (×4): 20 mg via ORAL
  Filled 2017-09-17 (×4): qty 1

## 2017-09-17 SURGICAL SUPPLY — 72 items
ADAPTER CARDIO PERF ANTE/RETRO (ADAPTER) ×2 IMPLANT
ADPR PRFSN 84XANTGRD RTRGD (ADAPTER) ×1
BLADE STERNUM SYSTEM 6 (BLADE) ×2 IMPLANT
BLADE SURG 15 STRL LF DISP TIS (BLADE) ×1 IMPLANT
BLADE SURG 15 STRL SS (BLADE) ×2
BLOOD HAEMOCONCENTR 700 MIDI (MISCELLANEOUS) ×1 IMPLANT
CANISTER SUCT 3000ML PPV (MISCELLANEOUS) ×2 IMPLANT
CANNULA AORTIC ROOT 9FR (CANNULA) ×1 IMPLANT
CANNULA EZ GLIDE AORTIC 21FR (CANNULA) ×3 IMPLANT
CANNULA FEM VENOUS REMOTE 22FR (CANNULA) ×1 IMPLANT
CANNULA GUNDRY RCSP 15FR (MISCELLANEOUS) ×2 IMPLANT
CANNULA MC2 2 STG 36/46 NON-V (CANNULA) IMPLANT
CANNULA VENOUS 2 STG 34/46 (CANNULA) ×1
CATH HEART VENT LEFT (CATHETERS) ×1 IMPLANT
CATH ROBINSON RED A/P 18FR (CATHETERS) ×4 IMPLANT
CATH THORACIC 36FR RT ANG (CATHETERS) ×3 IMPLANT
CRADLE DONUT ADULT HEAD (MISCELLANEOUS) ×2 IMPLANT
DEVICE SUT CK QUICK LOAD INDV (Prosthesis & Implant Heart) ×1 IMPLANT
DRAPE SLUSH/WARMER DISC (DRAPES) ×2 IMPLANT
DRSG AQUACEL AG ADV 3.5X14 (GAUZE/BANDAGES/DRESSINGS) ×1 IMPLANT
ELECT REM PT RETURN 9FT ADLT (ELECTROSURGICAL) ×4
ELECTRODE REM PT RTRN 9FT ADLT (ELECTROSURGICAL) ×2 IMPLANT
FELT TEFLON 1X6 (MISCELLANEOUS) ×3 IMPLANT
GAUZE SPONGE 4X4 12PLY STRL (GAUZE/BANDAGES/DRESSINGS) ×3 IMPLANT
GLOVE BIO SURGEON STRL SZ 6 (GLOVE) ×12 IMPLANT
GLOVE BIO SURGEON STRL SZ 6.5 (GLOVE) ×12 IMPLANT
GLOVE BIOGEL PI IND STRL 6 (GLOVE) IMPLANT
GLOVE BIOGEL PI IND STRL 6.5 (GLOVE) IMPLANT
GLOVE BIOGEL PI INDICATOR 6 (GLOVE) ×2
GLOVE BIOGEL PI INDICATOR 6.5 (GLOVE) ×2
GLOVE SURG SIGNA 7.5 PF LTX (GLOVE) ×6 IMPLANT
GOWN STRL REUS W/ TWL LRG LVL3 (GOWN DISPOSABLE) ×4 IMPLANT
GOWN STRL REUS W/ TWL XL LVL3 (GOWN DISPOSABLE) ×1 IMPLANT
GOWN STRL REUS W/TWL LRG LVL3 (GOWN DISPOSABLE) ×8
GOWN STRL REUS W/TWL XL LVL3 (GOWN DISPOSABLE) ×2
HEMOSTAT POWDER SURGIFOAM 1G (HEMOSTASIS) ×6 IMPLANT
HEMOSTAT SURGICEL 2X14 (HEMOSTASIS) ×2 IMPLANT
KIT BASIN OR (CUSTOM PROCEDURE TRAY) ×2 IMPLANT
KIT DILATOR VASC 18G NDL (KITS) ×1 IMPLANT
KIT DRAINAGE VACCUM ASSIST (KITS) ×1 IMPLANT
KIT ROOM TURNOVER OR (KITS) ×2 IMPLANT
KIT SUCTION CATH 14FR (SUCTIONS) ×4 IMPLANT
KIT SUT CK MINI COMBO 4X17 (Prosthesis & Implant Heart) ×1 IMPLANT
LINE VENT (MISCELLANEOUS) ×1 IMPLANT
NS IRRIG 1000ML POUR BTL (IV SOLUTION) ×10 IMPLANT
PACK OPEN HEART (CUSTOM PROCEDURE TRAY) ×2 IMPLANT
PAD ARMBOARD 7.5X6 YLW CONV (MISCELLANEOUS) ×4 IMPLANT
SET CARDIOPLEGIA MPS 5001102 (MISCELLANEOUS) ×1 IMPLANT
SUT BONE WAX W31G (SUTURE) ×2 IMPLANT
SUT ETHIBON 2 0 V 52N 30 (SUTURE) ×2 IMPLANT
SUT ETHIBOND 2 0 SH (SUTURE) ×2
SUT ETHIBOND 2 0 SH 36X2 (SUTURE) ×1 IMPLANT
SUT PROLENE 3 0 SH DA (SUTURE) ×2 IMPLANT
SUT PROLENE 4 0 RB 1 (SUTURE) ×14
SUT PROLENE 4 0 SH DA (SUTURE) ×1 IMPLANT
SUT PROLENE 4-0 RB1 .5 CRCL 36 (SUTURE) ×2 IMPLANT
SUT PROLENE 6 0 C 1 30 (SUTURE) ×2 IMPLANT
SUT SILK  1 MH (SUTURE) ×1
SUT SILK 1 MH (SUTURE) ×1 IMPLANT
SUT VIC AB 1 CTX 36 (SUTURE) ×4
SUT VIC AB 1 CTX36XBRD ANBCTR (SUTURE) ×2 IMPLANT
SYSTEM SAHARA CHEST DRAIN ATS (WOUND CARE) ×2 IMPLANT
TAPE CLOTH SURG 4X10 WHT LF (GAUZE/BANDAGES/DRESSINGS) ×1 IMPLANT
TAPE PAPER 2X10 WHT MICROPORE (GAUZE/BANDAGES/DRESSINGS) ×1 IMPLANT
TOWEL GREEN STERILE FF (TOWEL DISPOSABLE) ×2 IMPLANT
TRAY FOLEY SILVER 16FR TEMP (SET/KITS/TRAYS/PACK) ×2 IMPLANT
TUBE SUCT INTRACARD DLP 20F (MISCELLANEOUS) ×1 IMPLANT
UNDERPAD 30X30 (UNDERPADS AND DIAPERS) ×2 IMPLANT
VALVE AORTIC SZ23 INSP/RESIL (Prosthesis & Implant Heart) ×1 IMPLANT
VALVE MAGNA EASE AORTIC 23MM (Prosthesis & Implant Heart) IMPLANT
VENT LEFT HEART 12002 (CATHETERS) ×2
WATER STERILE IRR 1000ML POUR (IV SOLUTION) ×4 IMPLANT

## 2017-09-17 NOTE — Progress Notes (Signed)
MD Dorris Fetch to bedside rounding on patient, reviewed labs, vital signs and current chest tube output.  RN continue with current plan of care.

## 2017-09-17 NOTE — Anesthesia Postprocedure Evaluation (Signed)
Anesthesia Post Note  Patient: Luis Ferguson  Procedure(s) Performed: REDO AORTIC VALVE REPLACEMENT (AVR) (N/A ) TRANSESOPHAGEAL ECHOCARDIOGRAM (TEE) (N/A )     Patient location during evaluation: SICU Anesthesia Type: General Level of consciousness: sedated Pain management: pain level controlled Vital Signs Assessment: post-procedure vital signs reviewed and stable Respiratory status: patient remains intubated per anesthesia plan Cardiovascular status: stable Postop Assessment: no apparent nausea or vomiting Anesthetic complications: no    Last Vitals:  Vitals:   09/17/17 1530 09/17/17 1652  BP:    Pulse: 80   Resp: 12   Temp:    SpO2: 97% 100%    Last Pain:  Vitals:   09/17/17 0600  TempSrc: Oral                 Chase Arnall DANIEL

## 2017-09-17 NOTE — Anesthesia Procedure Notes (Signed)
Central Venous Catheter Insertion Performed by: Heather Roberts, MD, anesthesiologist Start/End2/22/2019 9:11 AM, 09/17/2017 9:21 AM Patient location: Pre-op. Preanesthetic checklist: patient identified, IV checked, site marked, risks and benefits discussed, surgical consent, monitors and equipment checked, pre-op evaluation, timeout performed and anesthesia consent Position: Trendelenburg Lidocaine 1% used for infiltration and patient sedated Hand hygiene performed , maximum sterile barriers used  and Seldinger technique used Catheter size: 8.5 Fr Total catheter length 8. PA cath was placed.Sheath introducer Swan type:thermodilution PA Cath depth:50 Procedure performed using ultrasound guided technique. Ultrasound Notes:anatomy identified, needle tip was noted to be adjacent to the nerve/plexus identified, no ultrasound evidence of intravascular and/or intraneural injection and image(s) printed for medical record Attempts: 1 Following insertion, line sutured and dressing applied. Post procedure assessment: free fluid flow, blood return through all ports and no air  Patient tolerated the procedure well with no immediate complications.

## 2017-09-17 NOTE — H&P (Signed)
[address]       [name] [address]             [phone]               HPI: Luis Ferguson returns for a scheduled follow-up visit  Luis Ferguson is a 57 year old man with a history of lupus, dyslipidemia, and aortic stenosis and insufficiency.  He developed decreased energy and fatigue, peripheral edema and dizziness with exertion.  An echocardiogram showed severe aortic stenosis with a mean gradient of 47 mmHg.  He was referred for aortic valve replacement.  I did an aortic valve replacement with an Edwards Intuity valve on 07/12/2017 via a right mini thoracotomy approach.  Initially no paravalvular leak was seen but just after chest closure he was noted to have a small paravalvular leak on echo.  We elected to follow that in hopes that it would resolve with time.  His initial postoperative course was uncomplicated and he went home on day 6.  He had minimal discomfort.  He did well initially but then developed fatigue and weakness.  He was found to be severely anemic and required blood transfusion.  He was diagnosed with hemolytic anemia.  Immunologic workup was negative.  An echocardiogram showed a persistent paravalvular leak.  He developed peripheral edema.  A transesophageal echocardiogram was done on 09/09/2017 which showed a severe perivalvular leak.  There was preserved left ventricular function.  He is anxious about having to consider redo surgery.  His swelling has improved with low-dose Lasix.  He is not having any shortness of breath.  He is concerned about finances with returning to work.      Past Medical History:  Diagnosis Date  . Aortic stenosis   . Arthritis    achiness in shoulder , elbows  . Connective tissue disease (HCC)    followed by Dr. Kathi Ferguson, [facility], pt. off Prednisone since mid 2017  . Fatigue   . Psoriasis    below the knee on both legs         Past Surgical History:  Procedure Laterality Date  . CARDIAC CATHETERIZATION     . HERNIA REPAIR  2016   left and right  . RIGHT/LEFT HEART CATH AND CORONARY ANGIOGRAPHY N/A 06/16/2017   Procedure: RIGHT/LEFT HEART CATH AND CORONARY ANGIOGRAPHY;  Surgeon: Lyn Records, MD;  Location: [facility] INVASIVE [facility];  Service: Cardiovascular;  Laterality: N/A;  . TEE WITHOUT CARDIOVERSION N/A 07/12/2017   Procedure: TRANSESOPHAGEAL ECHOCARDIOGRAM (TEE);  Surgeon: [name], MD;  Location: [facility] OR;  Service: Open Heart Surgery;  Laterality: N/A;  . TEE WITHOUT CARDIOVERSION N/A 09/09/2017   Procedure: TRANSESOPHAGEAL ECHOCARDIOGRAM (TEE);  Surgeon: Thurmon Fair, MD;  Location: [facility] ENDOSCOPY;  Service: Cardiovascular;  Laterality: N/A;          Current Outpatient Medications  Medication Sig Dispense Refill  . aspirin 81 MG tablet Take 81 mg by mouth daily.    [name] atorvastatin (LIPITOR) 20 MG tablet Take 20 mg by mouth daily.     . calcipotriene (DOVONOX) 0.005 % cream Apply 1 application topically 2 (two) times daily as needed.     . cholecalciferol (VITAMIN D) 1000 UNITS tablet Take 1,000 Units by mouth daily.    . clobetasol ointment (TEMOVATE) 0.05 % Apply 1 application topically 2 (two) times daily as needed.     . furosemide (LASIX) 20 MG tablet Take 1 tablet (20 mg total) by mouth daily. 14 tablet  1  . hydroxychloroquine (PLAQUENIL) 200 MG tablet Take 400 mg by mouth daily.     . metoprolol tartrate (LOPRESSOR) 50 MG tablet Take 1 tablet (50 mg total) by mouth 2 (two) times daily. 60 tablet 6  . potassium chloride (K-DUR,KLOR-CON) 20 MEQ tablet One tablet by  Mouth daily 14 tablet 1  . predniSONE (DELTASONE) 20 MG tablet Take 2.5 tablets (50 mg total) by mouth daily with breakfast. 90 tablet 1  . tamsulosin (FLOMAX) 0.4 MG CAPS capsule Take 0.4 mg by mouth.     No current facility-administered medications for this visit.     Physical Exam BP 134/68 (BP Location: Left Arm, Patient Position: Sitting, Cuff Size: Large)   Pulse 88   Ht 5'  6" (1.676 m)   Wt 166 lb 3.2 oz (75.4 kg)   SpO2 99% Comment: RA  BMI 26.30 kg/m  57 year old man in no acute distress Alert and oriented x3 with no focal deficits Lungs clear with equal breath sounds bilaterally Right chest incision well-healed Cardiac regular rate and rhythm with systolic and diastolic murmur Abdomen soft nontender Extremities with 1+ edema  Diagnostic Tests: Transesophageal echocardiogram 09/09/2017 Study Conclusions  - Left ventricle: There was mild concentric hypertrophy. Systolic function was normal. The estimated ejection fraction was in the range of 60% to 65%. Wall motion was normal; there were no regional wall motion abnormalities. - Aortic valve: A bioprosthesis was present. There was mild intravalvular regurgitation directed centrally in the LVOT. There was severe perivalvular regurgitation. The perivalvuilar leak occurs medially, in the general area of the noncoronary sinus. The jet is highly eccentric, hugging the LVOT wall posteriorly, towards the left atrium. Regurgitant fraction is approximately 50%. - Left atrium: No evidence of thrombus in the atrial cavity or appendage. No evidence of thrombus in the atrial cavity or appendage. No evidence of thrombus in the appendage. - Right atrium: No evidence of thrombus in the atrial cavity or appendage. - Atrial septum: No defect or patent foramen ovale was identified.  I personally reviewed the images and also reviewed in conjunction with Dr. Rennis Golden previously.  Impression: Luis Ferguson is a 57 year old gentleman who had an aortic valve replacement via right minithoracotomy about 2 months ago.  His postoperative course was unremarkable initially but he developed hemolytic anemia.  He has been found to have a significant paravalvular leak which is almost certainly the source of his hemolytic anemia.  He also has some signs of heart failure with peripheral edema.  His peripheral  edema has improved, but his hemolytic anemia persists.  Had a long discussion with Luis Ferguson.  I previously had a long discussion with Dr. Rennis Golden regarding options.  They understand there would be essentially an option for an occluder device to be placed percutaneously.  Our concern is that that will not give him a long-term durable result.  Given he is young and otherwise in good health we want to achieve the most durable long-term result possible.  I recommended that we proceed with a median sternotomy for repair of the paravalvular leak and possible redo aortic valve replacement.  I discussed the general nature of the procedure with Luis Ferguson.  They are familiar with it.  It is essentially a repeat of his previous operation.  We will try to repair the leak if possible but if not we will do a redo valve replacement.  We reviewed the indications, risks, benefits, and alternatives.  They are familiar with the risks from  his previous surgery.  They do understand there is a high risk of needing blood transfusion at this time due to his anemia.  The other risks include, but not limited to death, MI, DVT, PE, bleeding, infection, heart block requiring pacemaker, cardiac arrhythmias, as well as other organ system dysfunction including respiratory, renal or GI complications.   He accepts the risks and wishes to proceed  Plan: Median sternotomy for repair of paravalvular leak or possible redo aortic valve replacement on Friday, 09/17/2017  [name], MD [facility] [phone]    No interval change.  Luis Decent Dorris Fetch, MD [facility] [phone]

## 2017-09-17 NOTE — Anesthesia Procedure Notes (Signed)
Procedure Name: Intubation Date/Time: 09/17/2017 10:31 AM Performed by: Candis Shine, CRNA Pre-anesthesia Checklist: Patient identified, Emergency Drugs available, Suction available and Patient being monitored Patient Re-evaluated:Patient Re-evaluated prior to induction Oxygen Delivery Method: Circle System Utilized Preoxygenation: Pre-oxygenation with 100% oxygen Induction Type: IV induction Ventilation: Mask ventilation without difficulty Laryngoscope Size: Mac and 3 Grade View: Grade I Tube type: Oral Tube size: 8.0 mm Number of attempts: 1 Airway Equipment and Method: Stylet Placement Confirmation: ETT inserted through vocal cords under direct vision,  positive ETCO2 and breath sounds checked- equal and bilateral Secured at: 23 cm Tube secured with: Tape Dental Injury: Teeth and Oropharynx as per pre-operative assessment

## 2017-09-17 NOTE — Progress Notes (Signed)
TCTS BRIEF SICU PROGRESS NOTE  Day of Surgery  S/P Procedure(s) (LRB): REDO AORTIC VALVE REPLACEMENT (AVR) (N/A) TRANSESOPHAGEAL ECHOCARDIOGRAM (TEE) (N/A)   Sedated on vent NSR w/ stable hemodynamics O2 sats 100% Chest tube output trending down UOP > 200 mL/hr Labs okay  Plan: Continue routine early postop  Purcell Nails, MD 09/17/2017 6:42 PM

## 2017-09-17 NOTE — Brief Op Note (Signed)
09/17/2017  3:52 PM  PATIENT:  Luis Ferguson  57 y.o. male  PRE-OPERATIVE DIAGNOSIS:  Perivalvular Leak  POST-OPERATIVE DIAGNOSIS:  1. Perivalvular Leak 2. Leaflet perforation  PROCEDURE:TRANSESOPHAGEAL ECHOCARDIOGRAM (TEE),  REDO MEDIAN STERNOTOMY for REDO AORTIC VALVE REPLACEMENT (AVR)  (using an Inspiris Resilia pericardial aortic valve, model# 11500A, serial # [id number], size 23)  SURGEON:  Surgeon(s) and Role:    [name], MD - Primary  PHYSICIAN ASSISTANT: Doree Fudge PA-C  ASSISTANTS: Alyssa Hagerty RNFA  ANESTHESIA:   general  EBL:  800 mL   BLOOD ADMINISTERED:One PLTS and two PRBCs  DRAINS: Chest tubes placed in the mediastinal and pleural spaces   SPECIMEN:  Source of Specimen:  Intuit aortic valve  DISPOSITION OF SPECIMEN:  PATHOLOGY  COUNTS CORRECT:  YES  DICTATION: .Dragon Dictation  PLAN OF CARE: Admit to inpatient   PATIENT DISPOSITION:  ICU - intubated and hemodynamically stable.   Delay start of Pharmacological VTE agent (>24hrs) due to surgical blood loss or risk of bleeding: yes  BASELINE WEIGHT: 74.4 kg  Findings. Perivalvular leak at noncoronary side of [place]/R commissure. Perforation of midportion of right leaflet of valve. No AI or perivalvular leak after replacement.

## 2017-09-17 NOTE — Progress Notes (Signed)
Echocardiogram Echocardiogram Transesophageal has been performed.  Luis Ferguson 09/17/2017, 11:17 AM

## 2017-09-17 NOTE — Progress Notes (Signed)
NIF -40 VC 5 liters Patient had an audible cuff leak Extubated with RN with no complications. Patient was able to cough, say his name, and complete IS X5 at 1250.

## 2017-09-17 NOTE — Transfer of Care (Signed)
Immediate Anesthesia Transfer of Care Note  Patient: Luis Ferguson  Procedure(s) Performed: REDO AORTIC VALVE REPLACEMENT (AVR) (N/A ) TRANSESOPHAGEAL ECHOCARDIOGRAM (TEE) (N/A )  Patient Location: ICU  Anesthesia Type:General  Level of Consciousness: sedated and Patient remains intubated per anesthesia plan  Airway & Oxygen Therapy: Patient remains intubated per anesthesia plan and Patient placed on Ventilator (see vital sign flow sheet for setting)  Post-op Assessment: Report given to RN and Post -op Vital signs reviewed and stable  Post vital signs: Reviewed and stable  Last Vitals:  Vitals:   09/17/17 0920 09/17/17 0925  BP:    Pulse: 73 73  Resp: 17 19  Temp:    SpO2: 100% 100%    Last Pain:  Vitals:   09/17/17 0600  TempSrc: Oral      Patients Stated Pain Goal: 4 (09/17/17 0612)  Complications: No apparent anesthesia complications

## 2017-09-17 NOTE — Anesthesia Procedure Notes (Signed)
Arterial Line Insertion Start/End2/22/2019 9:00 AM Performed by: Quentin Ore, CRNA, CRNA  Patient location: Pre-op. Preanesthetic checklist: patient identified, IV checked, site marked, risks and benefits discussed, surgical consent, monitors and equipment checked, pre-op evaluation, timeout performed and anesthesia consent Lidocaine 1% used for infiltration Left, radial was placed Catheter size: 20 Fr Hand hygiene performed  and maximum sterile barriers used   Attempts: 1 Procedure performed without using ultrasound guided technique. Following insertion, dressing applied and Biopatch. Post procedure assessment: normal and unchanged  Patient tolerated the procedure well with no immediate complications.

## 2017-09-17 NOTE — Anesthesia Preprocedure Evaluation (Addendum)
Anesthesia Evaluation  Patient identified by MRN, date of birth, ID band Patient awake    Reviewed: Allergy & Precautions, NPO status , Patient's Chart, lab work & pertinent test results  History of Anesthesia Complications (+) PONV and history of anesthetic complications  Airway Mallampati: II  TM Distance: >3 FB Neck ROM: Full    Dental no notable dental hx. (+) Dental Advisory Given   Pulmonary neg pulmonary ROS,    Pulmonary exam normal        Cardiovascular Pt. on medications Normal cardiovascular exam+ Valvular Problems/Murmurs (Severe AS, mild AI. LVEF 55%) AS and AI  - Systolic murmurs Study Conclusions  - Left ventricle: There was mild concentric hypertrophy. Systolic   function was normal. The estimated ejection fraction was in the   range of 60% to 65%. Wall motion was normal; there were no   regional wall motion abnormalities. - Aortic valve: A bioprosthesis was present. There was mild   intravalvular regurgitation directed centrally in the LVOT. There   was severe perivalvular regurgitation. The perivalvuilar leak   occurs medially, in the general area of the noncoronary sinus.   The jet is highly eccentric, hugging the LVOT wall posteriorly,   towards the left atrium. Regurgitant fraction is approximately   50%.   Neuro/Psych negative neurological ROS     GI/Hepatic negative GI ROS, Neg liver ROS,   Endo/Other  negative endocrine ROS  Renal/GU negative Renal ROS     Musculoskeletal  (+) Arthritis ,   Abdominal   Peds  Hematology negative hematology ROS (+)   Anesthesia Other Findings   Reproductive/Obstetrics                            Lab Results  Component Value Date   WBC 10.7 (H) 09/16/2017   HGB 9.0 (L) 09/16/2017   HCT 30.1 (L) 09/16/2017   MCV 100.3 (H) 09/16/2017   PLT 115 (L) 09/16/2017   Lab Results  Component Value Date   CREATININE 0.78 09/16/2017   BUN 19 09/16/2017   NA 140 09/16/2017   K 4.1 09/16/2017   CL 106 09/16/2017   CO2 22 09/16/2017    Anesthesia Physical  Anesthesia Plan  ASA: IV  Anesthesia Plan: General   Post-op Pain Management:    Induction: Intravenous  PONV Risk Score and Plan: 2 and Ondansetron, Dexamethasone and Treatment may vary due to age or medical condition  Airway Management Planned: Oral ETT  Additional Equipment: Arterial line, CVP, PA Cath, TEE and Ultrasound Guidance Line Placement  Intra-op Plan:   Post-operative Plan: Post-operative intubation/ventilation  Informed Consent: I have reviewed the patients History and Physical, chart, labs and discussed the procedure including the risks, benefits and alternatives for the proposed anesthesia with the patient or authorized representative who has indicated his/her understanding and acceptance.   Dental advisory given  Plan Discussed with: CRNA, Anesthesiologist and Surgeon  Anesthesia Plan Comments:        Anesthesia Quick Evaluation

## 2017-09-18 ENCOUNTER — Other Ambulatory Visit: Payer: Self-pay

## 2017-09-18 ENCOUNTER — Inpatient Hospital Stay (HOSPITAL_COMMUNITY): Payer: Commercial Managed Care - PPO

## 2017-09-18 LAB — CBC
HCT: 29 % — ABNORMAL LOW (ref 39.0–52.0)
HEMATOCRIT: 29.1 % — AB (ref 39.0–52.0)
Hemoglobin: 9 g/dL — ABNORMAL LOW (ref 13.0–17.0)
Hemoglobin: 9.1 g/dL — ABNORMAL LOW (ref 13.0–17.0)
MCH: 29.9 pg (ref 26.0–34.0)
MCH: 30.6 pg (ref 26.0–34.0)
MCHC: 30.9 g/dL (ref 30.0–36.0)
MCHC: 31.4 g/dL (ref 30.0–36.0)
MCV: 96.7 fL (ref 78.0–100.0)
MCV: 97.6 fL (ref 78.0–100.0)
PLATELETS: 104 10*3/uL — AB (ref 150–400)
Platelets: 134 10*3/uL — ABNORMAL LOW (ref 150–400)
RBC: 3.01 MIL/uL — AB (ref 4.22–5.81)
RBC: 2.97 MIL/uL — AB (ref 4.22–5.81)
RDW: 22.2 % — ABNORMAL HIGH (ref 11.5–15.5)
RDW: 21.7 % — ABNORMAL HIGH (ref 11.5–15.5)
WBC: 12.8 10*3/uL — AB (ref 4.0–10.5)
WBC: 13.4 10*3/uL — ABNORMAL HIGH (ref 4.0–10.5)

## 2017-09-18 LAB — BASIC METABOLIC PANEL
Anion gap: 11 (ref 5–15)
BUN: 19 mg/dL (ref 6–20)
CHLORIDE: 107 mmol/L (ref 101–111)
CO2: 21 mmol/L — AB (ref 22–32)
Calcium: 7.4 mg/dL — ABNORMAL LOW (ref 8.9–10.3)
Creatinine, Ser: 0.83 mg/dL (ref 0.61–1.24)
GFR calc Af Amer: >60 mL/min (ref >60)
GFR calc non Af Amer: >60 mL/min (ref >60)
GLUCOSE: 172 mg/dL — AB (ref 65–99)
POTASSIUM: 4.2 mmol/L (ref 3.5–5.1)
Sodium: 139 mmol/L (ref 135–145)

## 2017-09-18 LAB — CREATININE, SERUM
Creatinine, Ser: 1.23 mg/dL (ref 0.61–1.24)
GFR calc Af Amer: >60 mL/min (ref >60)
GFR calc non Af Amer: >60 mL/min (ref >60)

## 2017-09-18 LAB — POCT I-STAT, CHEM 8
BUN: 24 mg/dL — ABNORMAL HIGH (ref 6–20)
CALCIUM ION: 1.02 mmol/L — AB (ref 1.15–1.40)
CREATININE: 1.1 mg/dL (ref 0.61–1.24)
Chloride: 97 mmol/L — ABNORMAL LOW (ref 101–111)
GLUCOSE: 235 mg/dL — AB (ref 65–99)
HCT: 27 % — ABNORMAL LOW (ref 39.0–52.0)
HEMOGLOBIN: 9.2 g/dL — AB (ref 13.0–17.0)
Potassium: 4.2 mmol/L (ref 3.5–5.1)
Sodium: 134 mmol/L — ABNORMAL LOW (ref 135–145)
TCO2: 21 mmol/L — AB (ref 22–32)

## 2017-09-18 LAB — BPAM PLATELET PHERESIS
Blood Product Expiration Date: 201902242359
ISSUE DATE / TIME: 201902221348
UNIT TYPE AND RH: 5100

## 2017-09-18 LAB — PREPARE PLATELET PHERESIS: Unit division: 0

## 2017-09-18 LAB — MAGNESIUM
Magnesium: 2.8 mg/dL — ABNORMAL HIGH (ref 1.7–2.4)
Magnesium: 2.7 mg/dL — ABNORMAL HIGH (ref 1.7–2.4)

## 2017-09-18 LAB — GLUCOSE, CAPILLARY
GLUCOSE-CAPILLARY: 151 mg/dL — AB (ref 65–99)
GLUCOSE-CAPILLARY: 152 mg/dL — AB (ref 65–99)
GLUCOSE-CAPILLARY: 160 mg/dL — AB (ref 65–99)
GLUCOSE-CAPILLARY: 212 mg/dL — AB (ref 65–99)
Glucose-Capillary: 184 mg/dL — ABNORMAL HIGH (ref 65–99)
Glucose-Capillary: 181 mg/dL — ABNORMAL HIGH (ref 65–99)

## 2017-09-18 LAB — TROPONIN I
TROPONIN I: 1.09 ng/mL — AB (ref <0.03)
Troponin I: 1.15 ng/mL (ref <0.03)

## 2017-09-18 NOTE — Progress Notes (Signed)
[address]       [name] [address]             [phone]        CARDIOTHORACIC SURGERY PROGRESS NOTE   R1 Day Post-Op Procedure(s) (LRB): REDO AORTIC VALVE REPLACEMENT (AVR) (N/A) TRANSESOPHAGEAL ECHOCARDIOGRAM (TEE) (N/A)  Subjective: Looks good and reports feeling well.  Mild soreness in chest w/ cough or movement.  Otherwise denies chest pain.  No SOB.  No nausea  Objective: Vital signs: BP Readings from Last 1 Encounters:  09/18/17 (!) 136/97   Pulse Readings from Last 1 Encounters:  09/18/17 78   Resp Readings from Last 1 Encounters:  09/18/17 (!) 21   Temp Readings from Last 1 Encounters:  09/18/17 98.2 F (36.8 C)    Hemodynamics: PAP: (8-36)/(1-18) 30/11 CO:  [3.8 L/min-6.6 L/min] 6.6 L/min CI:  [2.1 L/min/m2-3.6 L/min/m2] 3.6 L/min/m2  Physical Exam:  Rhythm:   sinus  Breath sounds: clear  Heart sounds:  RRR  Incisions:  Dressing dry, intact  Abdomen:  Soft, non-distended, non-tender  Extremities:  Warm, well-perfused  Chest tubes:  decreasing volume thin serosanguinous output, no air leak    Intake/Output from previous day: 02/22 0701 - 02/23 0700 In: 5166 [P.O.:300; I.V.:2752; Blood:1064; IV Piggyback:1050] Out: 4430 [Urine:2640; Blood:800; Chest Tube:990] Intake/Output this shift: Total I/O In: 420 [P.O.:240; I.V.:180] Out: 195 [Urine:165; Chest Tube:30]  Lab Results:  CBC: Recent Labs    09/17/17 2054 09/17/17 2113 09/18/17 0500  WBC 17.5*  --  12.8*  HGB 9.7* 9.9* 9.0*  HCT 30.5* 29.0* 29.1*  PLT 108*  --  104*    BMET:  Recent Labs    09/16/17 0900  09/17/17 2113 09/18/17 0500  NA 140   < > 141 139  K 4.1   < > 4.3 4.2  CL 106   < > 105 107  CO2 22  --   --  21*  GLUCOSE 117*   < > 122* 172*  BUN 19   < > 18 19  CREATININE 0.78   < > 0.70 0.83  CALCIUM 9.3  --   --  7.4*   < > = values in this interval not displayed.     PT/INR:   Recent Labs    09/17/17 1542  LABPROT 18.7*  INR  1.58    CBG (last 3)  Recent Labs    09/17/17 1952 09/18/17 0003 09/18/17 0443  GLUCAP 113* 152* 160*    ABG    Component Value Date/Time   PHART 7.400 09/17/2017 2102   PCO2ART 43.3 09/17/2017 2102   PO2ART 174.0 (H) 09/17/2017 2102   HCO3 26.8 09/17/2017 2102   TCO2 25 09/17/2017 2113   ACIDBASEDEF 1.0 09/17/2017 1603   O2SAT 100.0 09/17/2017 2102    CXR: PORTABLE CHEST 1 VIEW  COMPARISON:  September 17, 2017  FINDINGS: The ET and NG tubes have been removed. Other support apparatus is stable. The cardiomediastinal silhouette is stable. Mild atelectasis in the left base. No nodules, masses, other infiltrates, or pneumothorax.  IMPRESSION: 1. Support apparatus as above. 2. No other acute abnormalities.   Electronically Signed   By: Gerome Sam III M.D   On: 09/18/2017 07:21   EKG: NSR w/ some slight ST elevation inferior leads   Assessment/Plan: S/P Procedure(s) (LRB): REDO AORTIC VALVE REPLACEMENT (AVR) (N/A) TRANSESOPHAGEAL ECHOCARDIOGRAM (TEE) (N/A)  Overall doing well POD1 Maintaining NSR w/ stable hemodynamics, no drips Breathing comfortably w/  O2 sats 99% on 1 L/min Abnormal EKG w/ slight ST segment elevation inferior leads, clinically no signs or symptoms of ischemia - possibly pericardial changes Expected post op acute blood loss anemia with preop hemolytic anemia, Hgb 9.0 stable Expected post op atelectasis, mild   Mobilize  D/C lines D/C tubes later today or tomorrow, depending on output Check f/u EKG after chest tubes out  Continue Prednisone   Purcell Nails, MD 09/18/2017 12:19 PM

## 2017-09-18 NOTE — Progress Notes (Signed)
TCTS BRIEF SICU PROGRESS NOTE  1 Day Post-Op  S/P Procedure(s) (LRB): REDO AORTIC VALVE REPLACEMENT (AVR) (N/A) TRANSESOPHAGEAL ECHOCARDIOGRAM (TEE) (N/A)   Stable day NSR w/ stable BP Breathing comfortably on room air UOP adequate  Plan: Continue routine care  Purcell Nails, MD 09/18/2017 5:58 PM

## 2017-09-18 NOTE — Progress Notes (Signed)
Dr Cornelius Moras notified of new EKG changes. No new orders at this time.

## 2017-09-19 ENCOUNTER — Inpatient Hospital Stay (HOSPITAL_COMMUNITY): Payer: Commercial Managed Care - PPO

## 2017-09-19 DIAGNOSIS — I350 Nonrheumatic aortic (valve) stenosis: Secondary | ICD-10-CM

## 2017-09-19 DIAGNOSIS — I351 Nonrheumatic aortic (valve) insufficiency: Secondary | ICD-10-CM

## 2017-09-19 LAB — GLUCOSE, CAPILLARY
GLUCOSE-CAPILLARY: 244 mg/dL — AB (ref 65–99)
Glucose-Capillary: 164 mg/dL — ABNORMAL HIGH (ref 65–99)
Glucose-Capillary: 176 mg/dL — ABNORMAL HIGH (ref 65–99)
Glucose-Capillary: 175 mg/dL — ABNORMAL HIGH (ref 65–99)
Glucose-Capillary: 192 mg/dL — ABNORMAL HIGH (ref 65–99)
Glucose-Capillary: 179 mg/dL — ABNORMAL HIGH (ref 65–99)

## 2017-09-19 LAB — CBC
HCT: 25.2 % — ABNORMAL LOW (ref 39.0–52.0)
Hemoglobin: 8 g/dL — ABNORMAL LOW (ref 13.0–17.0)
MCH: 30.7 pg (ref 26.0–34.0)
MCHC: 31.7 g/dL (ref 30.0–36.0)
MCV: 96.6 fL (ref 78.0–100.0)
Platelets: 105 10*3/uL — ABNORMAL LOW (ref 150–400)
RBC: 2.61 MIL/uL — ABNORMAL LOW (ref 4.22–5.81)
RDW: 20.3 % — AB (ref 11.5–15.5)
WBC: 14.2 10*3/uL — ABNORMAL HIGH (ref 4.0–10.5)

## 2017-09-19 LAB — BASIC METABOLIC PANEL
Anion gap: 8 (ref 5–15)
BUN: 32 mg/dL — AB (ref 6–20)
CALCIUM: 7.5 mg/dL — AB (ref 8.9–10.3)
CO2: 23 mmol/L (ref 22–32)
CREATININE: 1.03 mg/dL (ref 0.61–1.24)
Chloride: 102 mmol/L (ref 101–111)
GFR calc Af Amer: >60 mL/min (ref >60)
Glucose, Bld: 169 mg/dL — ABNORMAL HIGH (ref 65–99)
Potassium: 4.4 mmol/L (ref 3.5–5.1)
Sodium: 133 mmol/L — ABNORMAL LOW (ref 135–145)

## 2017-09-19 LAB — TROPONIN I: Troponin I: 0.62 ng/mL (ref <0.03)

## 2017-09-19 MED ORDER — MOVING RIGHT ALONG BOOK
Freq: Once | Status: AC
  Administered 2017-09-19: 12:00:00
  Filled 2017-09-19: qty 1

## 2017-09-19 MED ORDER — POTASSIUM CHLORIDE CRYS ER 20 MEQ PO TBCR
20.0000 meq | EXTENDED_RELEASE_TABLET | Freq: Every day | ORAL | Status: DC
Start: 2017-09-19 — End: 2017-09-21
  Administered 2017-09-19 – 2017-09-21 (×3): 20 meq via ORAL
  Filled 2017-09-19 (×3): qty 1

## 2017-09-19 MED ORDER — FUROSEMIDE 40 MG PO TABS
40.0000 mg | ORAL_TABLET | Freq: Every day | ORAL | Status: DC
  Administered 2017-09-19 – 2017-09-21 (×3): 40 mg via ORAL
  Filled 2017-09-19 (×3): qty 1

## 2017-09-19 MED ORDER — INSULIN ASPART 100 UNIT/ML ~~LOC~~ SOLN
0.0000 [IU] | Freq: Three times a day (TID) | SUBCUTANEOUS | Status: DC
  Administered 2017-09-19 (×2): 2 [IU] via SUBCUTANEOUS

## 2017-09-19 NOTE — Progress Notes (Signed)
Luis Ferguson   DOB:[birth year]   WU#:[id number]   GNF#:[id number]  Subjective:  Luis Ferguson had his redo AVR on 09/17/2017. He tells me he did  "much better" this time, and already feels like he is making significant progress.  He is doing his incentive spirometry, taking a couple of walks a day, and has started passing gas.  Pain is well controlled.  Wife in room  Objective: 57-year-old white male examined in his recliner chair Vitals:   09/19/17 0900 09/19/17 0957  BP: (!) 140/91   Pulse: 85 98  Resp: (!) 21   Temp:    SpO2: 97%     Body mass index is 27.15 kg/m.  Intake/Output Summary (Last 24 hours) at 09/19/2017 1017 Last data filed at 09/19/2017 0949 Gross per 24 hour  Intake 1319.5 ml  Output 1780 ml  Net -460.5 ml     Sclerae unicteric  Lungs no rales or wheezes  Heart regular rate and rhythm--do not hear murmur  Abdomen soft, +BS  Neuro nonfocal    CBG (last 3)  Recent Labs    09/18/17 2308 09/19/17 0327 09/19/17 0947  GLUCAP 151* 176* 244*     Labs:  Lab Results  Component Value Date   WBC 14.2 (H) 09/19/2017   HGB 8.0 (L) 09/19/2017   HCT 25.2 (L) 09/19/2017   MCV 96.6 09/19/2017   PLT 105 (L) 09/19/2017   NEUTROABS 7.3 (H) 09/14/2017    @LASTCHEMISTRY @  Urine Studies No results for input(s): UHGB, CRYS in the last 72 hours.  Invalid input(s): UACOL, UAPR, USPG, UPH, UTP, UGL, UKET, UBIL, UNIT, UROB, ULEU, UEPI, UWBC, URBC, UBAC, CAST, [place], [place]  Basic Metabolic Panel: Recent Labs  Lab 09/14/17 0810 09/16/17 0900  09/17/17 1424 09/17/17 2054 09/17/17 2113 09/18/17 0500 09/18/17 1550 09/18/17 1603 09/19/17 0313  NA 139 140   < > 139  --  141 139  --  134* 133*  K 3.9 4.1   < > 4.9  --  4.3 4.2  --  4.2 4.4  CL 103 106   < > 103  --  105 107  --  97* 102  CO2 25 22  --   --   --   --  21*  --   --  23  GLUCOSE 129 117*   < > 168*  --  122* 172*  --  235* 169*  BUN 20 19   < > 17  --  18 19  --  24* 32*  CREATININE 0.84 0.78   < > 0.70 0.78  0.70 0.83 1.23 1.10 1.03  CALCIUM 9.2 9.3  --   --   --   --  7.4*  --   --  7.5*  MG  --   --   --   --  3.7*  --  2.8* 2.7*  --   --    < > = values in this interval not displayed.   GFR Estimated Creatinine Clearance: 70.5 mL/min (by C-G formula based on SCr of 1.03 mg/dL). Liver Function Tests: Recent Labs  Lab 09/14/17 0810 09/16/17 0900  AST 72* 70*  ALT 42 45  ALKPHOS 39* 37*  BILITOT 1.9* 1.8*  PROT 6.2* 5.9*  ALBUMIN 3.8 3.8   No results for input(s): LIPASE, AMYLASE in the last 168 hours. No results for input(s): AMMONIA in the last 168 hours. Coagulation profile Recent Labs  Lab 09/16/17 0900 09/17/17 1542  INR 1.17 1.58  CBC: Recent Labs  Lab 09/14/17 0810  09/17/17 1542 09/17/17 2054 09/17/17 2113 09/18/17 0500 09/18/17 1550 09/18/17 1603 09/19/17 0313  WBC 9.3   < > 23.3* 17.5*  --  12.8* 13.4*  --  14.2*  NEUTROABS 7.3*  --   --   --   --   --   --   --   --   HGB 8.8*   < > 10.7* 9.7* 9.9* 9.0* 9.1* 9.2* 8.0*  HCT 29.7*   < > 34.1* 30.5* 29.0* 29.1* 29.0* 27.0* 25.2*  MCV 102.1*   < > 96.1 95.9  --  96.7 97.6  --  96.6  PLT 129*   < > 106* 108*  --  104* 134*  --  105*   < > = values in this interval not displayed.   Cardiac Enzymes: Recent Labs  Lab 09/18/17 1308 09/18/17 1550 09/19/17 0313  TROPONINI 1.09* 1.15* 0.62*   BNP: Invalid input(s): POCBNP CBG: Recent Labs  Lab 09/18/17 1718 09/18/17 1945 09/18/17 2308 09/19/17 0327 09/19/17 0947  GLUCAP 212* 181* 151* 176* 244*   D-Dimer No results for input(s): DDIMER in the last 72 hours. Hgb A1c No results for input(s): HGBA1C in the last 72 hours. Lipid Profile No results for input(s): CHOL, HDL, LDLCALC, TRIG, CHOLHDL, LDLDIRECT in the last 72 hours. Thyroid function studies No results for input(s): TSH, T4TOTAL, T3FREE, THYROIDAB in the last 72 hours.  Invalid input(s): FREET3 Anemia work up No results for input(s): VITAMINB12, FOLATE, FERRITIN, TIBC, IRON, RETICCTPCT  in the last 72 hours. Microbiology Recent Results (from the past 240 hour(s))  Surgical pcr screen     Status: None   Collection Time: 09/16/17  8:59 AM  Result Value Ref Range Status   MRSA, PCR NEGATIVE NEGATIVE Final   Staphylococcus aureus NEGATIVE NEGATIVE Final    Comment: (NOTE) The Xpert SA Assay (FDA approved for NASAL specimens in patients 58 years of age and older), is one component of a comprehensive surveillance program. It is not intended to diagnose infection nor to guide or monitor treatment. Performed at [facility], [address], [place], [place] [address]       Studies:  Dg Chest Port 1 View  Result Date: 09/19/2017 CLINICAL DATA:  Follow-up atelectasis. EXAM: PORTABLE CHEST 1 VIEW COMPARISON:  09/18/2017 FINDINGS: Swan-Ganz catheter is been removed. Right internal jugular venous access sheath remains in place. Mediastinal drain remains in place. Previous aortic valve replacement. Epicardial leads remain visible. Minimal basilar atelectasis persists, left more than right, slightly improved since yesterday. IMPRESSION: Swan-Ganz catheter removed. Slight improvement in basilar atelectasis left more than right. Electronically Signed   By: Paulina Fusi M.D.   On: 09/19/2017 06:44   Dg Chest Port 1 View  Result Date: 09/18/2017 CLINICAL DATA:  Status post surgery. EXAM: PORTABLE CHEST 1 VIEW COMPARISON:  September 17, 2017 FINDINGS: The ET and NG tubes have been removed. Other support apparatus is stable. The cardiomediastinal silhouette is stable. Mild atelectasis in the left base. No nodules, masses, other infiltrates, or pneumothorax. IMPRESSION: 1. Support apparatus as above. 2. No other acute abnormalities. Electronically Signed   By: Gerome Sam III M.D   On: 09/18/2017 07:21   Dg Chest Port 1 View  Result Date: 09/17/2017 CLINICAL DATA:  Post AVR. EXAM: PORTABLE CHEST 1 VIEW COMPARISON:  Chest x-ray from yesterday. FINDINGS: Endotracheal tube in  position with the tip 2.9 cm above the level of the carina. Enteric tube  entering the stomach with the distal side port in the distal esophagus. Right internal jugular Swan-Ganz catheter with the tip in the left pulmonary artery. Mediastinal chest tube in good position. Interval aortic valve prosthesis revision. The heart is borderline enlarged. Low lung volumes with bronchovascular crowding. Mild bibasilar atelectasis. No pneumothorax. No acute osseous abnormality. Unchanged fixation plate and screws over the right mid chest. IMPRESSION: 1. Status post aortic valve prosthesis revision. 2. Enteric tube with distal side port in the distal esophagus. Recommend advancement. Other support tubes are appropriately positioned. Electronically Signed   By: Obie Dredge M.D.   On: 09/17/2017 16:16    Assessment: 57 y.o. [place] man with a Hb of 6.2 noted 07/30/2017, with an LDH of 1647, absolute reticulocyte of 247.3, with haptoglobin 10,  but normal B-12, folate, ferritin and creatinine, PT and APTT and negative DAT for IgG and complement (a) review of blood film showed rouleaux, schistocytes/ RBC fragments (4-6/HPF)             (b) ADAMTS13 is WNL, ruling out TTP             (c) flow cytometry for paroxysmal nocturnal hemoglobinuria negative              (1) history of lupus, on plaquenil             (a) Dr Kathi Ludwig tells me patient was tested for glucose-6-phosphate dehydrogenase deficiency through her office, with negative results  (2) s/p aortic valve replacement 07/12/2017, tissue valve             (a) echo 08/06/2017 showed mild perivalvular regurgitation   (b) TEE 09/09/2017 showed severe perivalvular regurgitation, c 50%  (c) s/p redo AVR 09/17/2017-- post-op TEE shows normal perivalvular leak and transaortic gradient    (3) s/p RBC transfusion 07/19/2017 and multiple since  (4) prednisone 60 mg/day started 08/06/2017  (a) currently on taper, decreased to 20 mg/ day on  09/15/2017  Plan:  Luis Ferguson is making an excellent recovery post-op and I expect this redo will resolve the hemolysis problem. I will follow his reticulocytes, LDH and CBC, and expect these to normalize over the next 3-6 weeks. I will slowly drop the prednisone dose over that time. If all goes well, anticipate his being off prednisone in about 3 months, with no evidence of hemolysis, at which point we can d/c hematologic follow-up.   Lowella Dell, MD 09/19/2017  10:17 AM Medical Oncology and Hematology [facility] [address] [place], [place] [address] Tel. [phone]    Fax. [phone]

## 2017-09-19 NOTE — Progress Notes (Signed)
Patient transferred to 4E05. SCDs transferred with patient. Wife at bedside. Receiving RN at bedside on arrival. VSS.   Belongings: Glasses and case with patient.

## 2017-09-19 NOTE — Progress Notes (Signed)
[address]       [name] [address]             [phone]        CARDIOTHORACIC SURGERY PROGRESS NOTE   R2 Days Post-Op Procedure(s) (LRB): REDO AORTIC VALVE REPLACEMENT (AVR) (N/A) TRANSESOPHAGEAL ECHOCARDIOGRAM (TEE) (N/A)  Subjective: Looks very good and feels well.  Minimal pain.  No SOB.  Good appetite  Objective: Vital signs: BP Readings from Last 1 Encounters:  09/19/17 (!) 146/88   Pulse Readings from Last 1 Encounters:  09/19/17 93   Resp Readings from Last 1 Encounters:  09/19/17 18   Temp Readings from Last 1 Encounters:  09/19/17 97.7 F (36.5 C) (Oral)    Hemodynamics: PAP: (28-30)/(11-12) 30/11  Physical Exam:  Rhythm:   sinus  Breath sounds: clear  Heart sounds:  RRR  Incisions:  Dressing dry, intact  Abdomen:  Soft, non-distended, non-tender  Extremities:  Warm, well-perfused  Chest tubes:  low volume thin serosanguinous output, no air leak    Intake/Output from previous day: 02/23 0701 - 02/24 0700 In: 1959.5 [P.O.:1380; I.V.:379.5; IV Piggyback:200] Out: 1725 [Urine:1535; Chest Tube:190] Intake/Output this shift: Total I/O In: 450 [P.O.:420; I.V.:30] Out: 180 [Urine:160; Chest Tube:20]  Lab Results:  CBC: Recent Labs    09/18/17 1550 09/18/17 1603 09/19/17 0313  WBC 13.4*  --  14.2*  HGB 9.1* 9.2* 8.0*  HCT 29.0* 27.0* 25.2*  PLT 134*  --  105*    BMET:  Recent Labs    09/18/17 0500  09/18/17 1603 09/19/17 0313  NA 139  --  134* 133*  K 4.2  --  4.2 4.4  CL 107  --  97* 102  CO2 21*  --   --  23  GLUCOSE 172*  --  235* 169*  BUN 19  --  24* 32*  CREATININE 0.83   < > 1.10 1.03  CALCIUM 7.4*  --   --  7.5*   < > = values in this interval not displayed.     PT/INR:   Recent Labs    09/17/17 1542  LABPROT 18.7*  INR 1.58    CBG (last 3)  Recent Labs    09/18/17 2308 09/19/17 0327 09/19/17 0947  GLUCAP 151* 176* 244*    ABG    Component Value Date/Time   PHART 7.400  09/17/2017 2102   PCO2ART 43.3 09/17/2017 2102   PO2ART 174.0 (H) 09/17/2017 2102   HCO3 26.8 09/17/2017 2102   TCO2 21 (L) 09/18/2017 1603   ACIDBASEDEF 1.0 09/17/2017 1603   O2SAT 100.0 09/17/2017 2102    CXR: PORTABLE CHEST 1 VIEW  COMPARISON:  09/18/2017  FINDINGS: Swan-Ganz catheter is been removed. Right internal jugular venous access sheath remains in place. Mediastinal drain remains in place. Previous aortic valve replacement. Epicardial leads remain visible. Minimal basilar atelectasis persists, left more than right, slightly improved since yesterday.  IMPRESSION: Swan-Ganz catheter removed. Slight improvement in basilar atelectasis left more than right.   Electronically Signed   By: Paulina Fusi M.D.   On: 09/19/2017 06:44   EKG: NSR w/ minimal ST elevation inferior leads, stable   Assessment/Plan: S/P Procedure(s) (LRB): REDO AORTIC VALVE REPLACEMENT (AVR) (N/A) TRANSESOPHAGEAL ECHOCARDIOGRAM (TEE) (N/A)  Overall doing well POD2 Maintaining NSR w/ stable BP Breathing comfortably w/ O2 sats 99% on room air Abnormal EKG w/ slight ST segment elevation inferior leads, unchanged, clinically no signs or symptoms of ischemia - likely pericardial  changes Expected post op acute blood loss anemia with preop hemolytic anemia, Hgb 8.0  Expected post op atelectasis, mild Elevated CBG's, likely related to steroids and good oral intake   Mobilize  D/C tubes   Continue Prednisone  Gentle diuresis  Watch anemia  Transfer step down   Purcell Nails, MD 09/19/2017 10:59 AM

## 2017-09-19 NOTE — Plan of Care (Signed)
Patient ambulating around unit without assistive devices. Minimal c/o pain. Hemodynamically stable, on Room Air. Able to void post urinary catheter removal.   Progressing Education: Ability to demonstrate proper wound care will improve 09/19/2017 1457 - Progressing by Susy Manor, RN Knowledge of disease or condition will improve 09/19/2017 1457 - Progressing by Susy Manor, RN Knowledge of the prescribed therapeutic regimen will improve 09/19/2017 1457 - Progressing by Susy Manor, RN Activity: Risk for activity intolerance will decrease 09/19/2017 1457 - Progressing by Susy Manor, RN Cardiac: Hemodynamic stability will improve 09/19/2017 1457 - Progressing by Susy Manor, RN Clinical Measurements: Postoperative complications will be avoided or minimized 09/19/2017 1457 - Progressing by Susy Manor, RN Respiratory: Respiratory status will improve 09/19/2017 1457 - Progressing by Susy Manor, RN Skin Integrity: Wound healing without signs and symptoms of infection 09/19/2017 1457 - Progressing by Susy Manor, RN Risk for impaired skin integrity will decrease 09/19/2017 1457 - Progressing by Susy Manor, RN Urinary Elimination: Ability to achieve and maintain adequate renal perfusion and functioning will improve 09/19/2017 1457 - Progressing by Susy Manor, RN

## 2017-09-20 DIAGNOSIS — Z952 Presence of prosthetic heart valve: Secondary | ICD-10-CM

## 2017-09-20 DIAGNOSIS — T8203XA Leakage of heart valve prosthesis, initial encounter: Principal | ICD-10-CM

## 2017-09-20 LAB — CBC WITH DIFFERENTIAL/PLATELET
BASOS ABS: 0 10*3/uL (ref 0.0–0.1)
BASOS PCT: 0 %
EOS ABS: 0 10*3/uL (ref 0.0–0.7)
Eosinophils Relative: 0 %
HEMATOCRIT: 24.5 % — AB (ref 39.0–52.0)
HEMOGLOBIN: 7.6 g/dL — AB (ref 13.0–17.0)
Lymphocytes Relative: 12 %
Lymphs Abs: 2.1 10*3/uL (ref 0.7–4.0)
MCH: 30.4 pg (ref 26.0–34.0)
MCHC: 31 g/dL (ref 30.0–36.0)
MCV: 98 fL (ref 78.0–100.0)
MONOS PCT: 6 %
Monocytes Absolute: 1 10*3/uL (ref 0.1–1.0)
NEUTROS PCT: 82 %
Neutro Abs: 14.5 10*3/uL — ABNORMAL HIGH (ref 1.7–7.7)
Platelets: 115 10*3/uL — ABNORMAL LOW (ref 150–400)
RBC: 2.5 MIL/uL — AB (ref 4.22–5.81)
RDW: 19.6 % — ABNORMAL HIGH (ref 11.5–15.5)
WBC: 17.7 10*3/uL — AB (ref 4.0–10.5)

## 2017-09-20 LAB — BPAM RBC
BLOOD PRODUCT EXPIRATION DATE: 201903182359
BLOOD PRODUCT EXPIRATION DATE: 201903192359
BLOOD PRODUCT EXPIRATION DATE: 201903222359
Blood Product Expiration Date: 201903192359
ISSUE DATE / TIME: 201902221025
ISSUE DATE / TIME: 201902221025
ISSUE DATE / TIME: 201902221113
ISSUE DATE / TIME: 201902221113
UNIT TYPE AND RH: 5100
UNIT TYPE AND RH: 5100
Unit Type and Rh: 5100
Unit Type and Rh: 5100

## 2017-09-20 LAB — TYPE AND SCREEN
ABO/RH(D): O POS
ANTIBODY SCREEN: NEGATIVE
UNIT DIVISION: 0
UNIT DIVISION: 0
Unit division: 0
Unit division: 0

## 2017-09-20 LAB — BASIC METABOLIC PANEL
Anion gap: 9 (ref 5–15)
BUN: 33 mg/dL — AB (ref 6–20)
CALCIUM: 7.7 mg/dL — AB (ref 8.9–10.3)
CHLORIDE: 103 mmol/L (ref 101–111)
CO2: 23 mmol/L (ref 22–32)
CREATININE: 0.97 mg/dL (ref 0.61–1.24)
GFR calc non Af Amer: >60 mL/min (ref >60)
Glucose, Bld: 113 mg/dL — ABNORMAL HIGH (ref 65–99)
Potassium: 4.3 mmol/L (ref 3.5–5.1)
SODIUM: 135 mmol/L (ref 135–145)

## 2017-09-20 LAB — GLUCOSE, CAPILLARY
GLUCOSE-CAPILLARY: 90 mg/dL (ref 65–99)
Glucose-Capillary: 103 mg/dL — ABNORMAL HIGH (ref 65–99)
Glucose-Capillary: 131 mg/dL — ABNORMAL HIGH (ref 65–99)
Glucose-Capillary: 138 mg/dL — ABNORMAL HIGH (ref 65–99)

## 2017-09-20 LAB — RETICULOCYTES
RBC.: 2.5 MIL/uL — AB (ref 4.22–5.81)
RETIC COUNT ABSOLUTE: 185 10*3/uL (ref 19.0–186.0)
RETIC CT PCT: 7.4 % — AB (ref 0.4–3.1)

## 2017-09-20 LAB — POCT I-STAT 4, (NA,K, GLUC, HGB,HCT)
Glucose, Bld: 97 mg/dL (ref 65–99)
HEMATOCRIT: 32 % — AB (ref 39.0–52.0)
Hemoglobin: 10.9 g/dL — ABNORMAL LOW (ref 13.0–17.0)
Potassium: 4 mmol/L (ref 3.5–5.1)
SODIUM: 142 mmol/L (ref 135–145)

## 2017-09-20 LAB — LACTATE DEHYDROGENASE: LDH: 727 U/L — AB (ref 98–192)

## 2017-09-20 MED ORDER — METOPROLOL TARTRATE 25 MG PO TABS
25.0000 mg | ORAL_TABLET | Freq: Two times a day (BID) | ORAL | Status: DC
  Administered 2017-09-20 – 2017-09-21 (×3): 25 mg via ORAL
  Filled 2017-09-20 (×3): qty 1

## 2017-09-20 MED ORDER — PREMIER PROTEIN SHAKE
11.0000 [oz_av] | Freq: Every day | ORAL | Status: DC
  Administered 2017-09-20: 11 [oz_av] via ORAL
  Filled 2017-09-20 (×3): qty 325.31

## 2017-09-20 NOTE — Progress Notes (Signed)
Initial Nutrition Assessment  DOCUMENTATION CODES:   Not applicable  INTERVENTION:   RD to order Premier Protein daily; each supplement provides 30g protein, 160kcal, and 5g carbohydrate.   NUTRITION DIAGNOSIS:   Increased nutrient needs related to wound healing as evidenced by estimated needs.  GOAL:   Patient will meet greater than or equal to 90% of their needs  MONITOR:   PO intake, Supplement acceptance, Labs, Weight trends, I & O's  REASON FOR ASSESSMENT:   Malnutrition Screening Tool    ASSESSMENT:   57 y.o. Male with PMH of lupus, dyslipidemia, and AVR 07/12/2017. S/P AVR on 09/17/2017.  Pt reports having a great appetite and eating 100% of all meals. Pt also reports eating 100% of balanced meals 3x/day PTA.   Pt denies N/V/D but admits to minor constipation which he reports is resolving with medication.  Pt amenable to receiving Premier protein to increase protein intake for incision healing.   Pt reports losing 15lb in one week after surgery in December; He reports weighing 150lb at that time and has been steadily gaining weight due to prednisone/increased appetite. This can be seen in the wt hx outlined below.    Medications: Prednisone, colace, lasix, insulin, KCl tablet, protonix, scopolamine patch.  Labs: Ca (L; 7.7), BUN (H;33), WBC (H; 17.7) CBG (last 3)  Recent Labs    09/19/17 1636 09/19/17 2104 09/20/17 0553  GLUCAP 192* 179* 90    NUTRITION - FOCUSED PHYSICAL EXAM:  No muscle or fat depletions noted. Mild edema on lower legs.   Diet Order:  Diet Heart Room service appropriate? Yes; Fluid consistency: Thin  EDUCATION NEEDS:   Education needs have been addressed  Skin:  Skin Assessment: Skin Integrity Issues: Skin Integrity Issues:: Incisions Incisions: chest  Last BM:  2/25  Height:   Ht Readings from Last 1 Encounters:  09/18/17 5\' 6"  (1.676 m)   Weight:   Wt Readings from Last 15 Encounters:  09/20/17 170 lb 1.6 oz (77.2  kg)  09/16/17 164 lb 4.8 oz (74.5 kg)  09/14/17 166 lb 3.2 oz (75.4 kg)  09/07/17 164 lb 6.4 oz (74.6 kg)  09/03/17 167 lb (75.8 kg)  08/31/17 165 lb 12.8 oz (75.2 kg)  08/31/17 167 lb 14.4 oz (76.2 kg)  08/10/17 152 lb 3.2 oz (69 kg)  08/06/17 159 lb 9.8 oz (72.4 kg)  07/30/17 150 lb (68 kg)  07/17/17 171 lb 8.3 oz (77.8 kg)  07/09/17 160 lb 14.4 oz (73 kg)  07/06/17 165 lb (74.8 kg)  06/29/17 165 lb (74.8 kg)  06/16/17 165 lb (74.8 kg)    Ideal Body Weight:  64.5 kg  BMI:  Body mass index is 27.45 kg/m.  Estimated Nutritional Needs:   Kcal:  1650-1850  Protein:  82-92g  Fluid:  >1.6L  Wasim Hurlbut, MS, Dietetic Intern Pager # [phone]

## 2017-09-20 NOTE — Progress Notes (Signed)
CARDIAC REHAB PHASE I   PRE:  Rate/Rhythm: 104 ST    BP: sitting 138/91    SaO2: 96 RA  MODE:  Ambulation: 940 ft   POST:  Rate/Rhythm: 125 ST    BP: sitting 160/94     SaO2: 100 RA  Pt moving independently. No c/o walking, denied SOB. HR elevated, asx. BP also up. Return to recliner. Discussed CRPII and diet. Will refer to [place] CRPII however pt is eager to get back to work in 6 weeks. [id number]   Harriet Masson CES, ACSM 09/20/2017 9:22 AM

## 2017-09-20 NOTE — Progress Notes (Signed)
Patient education completed for removal of pacer wires, patient verbalized understanding, 4 pacing wires removed as ordered, procedure well tolerated, patient now on bedrest will continue to monitor. Luis Ferguson

## 2017-09-20 NOTE — Discharge Summary (Addendum)
Physician Discharge Summary       [address]       [name] [address]             [phone]    Patient ID: Luis Ferguson MRN: [id number] DOB/AGE: 1960/10/13 57 y.o.  Admit date: 09/17/2017 Discharge date: 09/21/2017  Admission Diagnoses: Paravalvular leak, status post aortic valve replacement.  Discharge Diagnoses:  1. S/P AVR (aortic valve replacement) 2. Valve leaflet perforation  3. Inferior and lateral Q waves, mildly elevated ST inferiorly felt secondary to pericarditis (not ischemia) 4. History of arthritis-achiness in shoulder , elbows 5. Connective tissue disease (HCC) 6. Psoriasis-below the knee on both legs  Procedure (s):  Median sternotomy, redo aortic valve replacement with 23-mm Inspiris Resilia aortic valve, model #11500A, serial #[id number] by Dr. Dorris Fetch on 09/17/2017.  History of Presenting Illness: Luis Ferguson is a 57 year old man with a history of lupus, dyslipidemia, and aortic stenosis and insufficiency.  He developed decreased energy and fatigue, peripheral edema and dizziness with exertion.  An echocardiogram showed severe aortic stenosis with a mean gradient of 47 mmHg.  He was referred for aortic valve replacement.  I did an aortic valve replacement with an Edwards Intuity valve on 07/12/2017 via a right mini thoracotomy approach.  Initially no paravalvular leak was seen but just after chest closure he was noted to have a small paravalvular leak on echo.  We elected to follow that in hopes that it would resolve with time.  His initial postoperative course was uncomplicated and he went home on day 6.  He had minimal discomfort.  He did well initially but then developed fatigue and weakness.  He was found to be severely anemic and required blood transfusion.  He was diagnosed with hemolytic anemia.  Immunologic workup was negative.  An echocardiogram showed a persistent paravalvular leak.  He developed peripheral edema.  A  transesophageal echocardiogram was done on 09/09/2017 which showed a severe perivalvular leak.  There was preserved left ventricular function.  He is anxious about having to consider redo surgery.  His swelling has improved with low-dose Lasix.  He is not having any shortness of breath.  He is concerned about finances with returning to work. Luis Ferguson is a 57 year old gentleman who had an aortic valve replacement via right minithoracotomy about 2 months ago.  His postoperative course was unremarkable initially but he developed hemolytic anemia.  He has been found to have a significant paravalvular leak which is almost certainly the source of his hemolytic anemia.  He also has some signs of heart failure with peripheral edema.  His peripheral edema has improved, but his hemolytic anemia persists.  Had a long discussion with Luis Ferguson.  I previously had a long discussion with Dr. Rennis Golden regarding options.  They understand there would be essentially an option for an occluder device to be placed percutaneously.  Our concern is that that will not give him a long-term durable result.  Given he is young and otherwise in good health we want to achieve the most durable long-term result possible.  Dr. Dorris Fetch recommended that we proceed with a median sternotomy for repair of the paravalvular leak and possible redo aortic valve replacement.  Dr. Dorris Fetch discussed the general nature of the procedure with Luis Ferguson. They are familiar with it.  It is essentially a repeat of his previous operation.  We will try to repair the leak if possible but if not we will do a redo  valve replacement.  We reviewed the indications, risks, benefits, and alternatives. He was admitted to [facility] on 09/17/2017 in order to undergo a sternotomy, redo AVR.   Brief Hospital Course:  The patient was extubated the evening of surgery without difficulty. He remained afebrile and hemodynamically stable. He  did have EKG changes (slight ST segment elevation in the inferior leads), Troponin I trended downward, and clinically he had no signs or symptoms of ischemia.  ST changes were felt secondary to probable pericarditis. Theone Murdoch, a line, chest tubes, and foley were removed early in the post operative course. Lopressor was started and titrated accordingly. He was volume over loaded and diuresed. He had ABL anemia.He did not require a post op transfusion. Last H and H was 7.6 and 24.5. His WBC did increase but this was felt secondary to Prednisone. He was weaned off the insulin drip.  [name] glucose remained well controlled.[name] HGA1C pre op was 4.1. The patient was felt surgically stable for transfer from the ICU to [facility] for further convalescence on 09/19/2017. He continues to progress with cardiac rehab. He was ambulating on room air. He has been tolerating a diet and has had a bowel movement. Epicardial pacing wires were removed on 09/20/2017. Chest tube sutures will be removed the day of discharge. EKG done 02/26 showed . sinus tachycardia at 100 with inferior and lateral ST elevation and small Q waves, suggestive of recent inferior injury pattern. Dr. Rennis Golden reviewed this himself. He also ordered an echo which showed LVEF 50-55%, no perivalvular leak, trivial MR, no regional wall abnormality, and small pericardial effusion. As discussed with Dr. Rennis Golden, patient is asymptomatic regarding probable pericarditis so no need for treatment at this time. He states is ok to discharge patient today. The patient is also felt surgically stable for discharge today.   Latest Vital Signs: Blood pressure 128/87, pulse 93, temperature 97.8 F (36.6 C), temperature source Oral, resp. rate (!) 21, height 5\' 6"  (1.676 m), weight 171 lb 4.8 oz (77.7 kg), SpO2 96 %.  Physical Exam: Cardiovascular: RRR, no murmur Pulmonary: Slightly diminished at bases bilaterally Abdomen: Soft, non tender, bowel sounds  present. Extremities: Mild bilateral lower extremity edema.  Wounds: Sternal wound is clean and dry.     Discharge Condition: Stable and discharged to home.  Recent laboratory studies:  Lab Results  Component Value Date   WBC 17.7 (H) 09/20/2017   HGB 7.6 (L) 09/20/2017   HCT 24.5 (L) 09/20/2017   MCV 98.0 09/20/2017   PLT 115 (L) 09/20/2017   Lab Results  Component Value Date   NA 135 09/20/2017   K 4.3 09/20/2017   CL 103 09/20/2017   CO2 23 09/20/2017   CREATININE 0.97 09/20/2017   GLUCOSE 113 (H) 09/20/2017     Diagnostic Studies: Dg Chest 2 View  Result Date: 09/16/2017 CLINICAL DATA:  Pre-admit for redo of cardiac valve prosthesis EXAM: CHEST  2 VIEW COMPARISON:  Chest x-ray of 08/31/2017 FINDINGS: No active infiltrate or effusion is seen. Mediastinal and hilar contours are unremarkable. Prosthetic aortic valve is noted. Fixation plate and screws are noted across the mid chest. The heart is within normal limits in size. There is a mild curvature of the thoracic spine convex to the right. IMPRESSION: No active cardiopulmonary disease. Electronically Signed   By: Dwyane Dee M.D.   On: 09/16/2017 09:31   Dg Chest Port 1 View  Result Date: 09/19/2017 CLINICAL DATA:  Follow-up atelectasis. EXAM: PORTABLE CHEST  1 VIEW COMPARISON:  09/18/2017 FINDINGS: Swan-Ganz catheter is been removed. Right internal jugular venous access sheath remains in place. Mediastinal drain remains in place. Previous aortic valve replacement. Epicardial leads remain visible. Minimal basilar atelectasis persists, left more than right, slightly improved since yesterday. IMPRESSION: Swan-Ganz catheter removed. Slight improvement in basilar atelectasis left more than right. Electronically Signed   By: Paulina Fusi M.D.   On: 09/19/2017 06:44    Discharge Instructions    Amb Referral to Cardiac Rehabilitation   Complete by:  As directed    Diagnosis:  Valve Replacement   Valve:  Aortic      Discharge  Medications: Allergies as of 09/21/2017      Reactions   Ace Inhibitors Cough   Codeine Itching, Rash      Medication List    STOP taking these medications   aspirin 81 MG tablet Replaced by:  aspirin 325 MG EC tablet     TAKE these medications   aspirin 325 MG EC tablet Take 1 tablet (325 mg total) by mouth daily. Replaces:  aspirin 81 MG tablet   atorvastatin 20 MG tablet Commonly known as:  LIPITOR Take 20 mg by mouth daily.   calcipotriene 0.005 % cream Commonly known as:  DOVONOX Apply 1 application topically 2 (two) times daily as needed (psoriasis).   cholecalciferol 1000 units tablet Commonly known as:  VITAMIN D Take 1,000 Units by mouth daily.   clobetasol ointment 0.05 % Commonly known as:  TEMOVATE Apply 1 application topically 2 (two) times daily as needed (psoriasis).   FISH OIL PO Take 360 mg by mouth daily.   furosemide 40 MG tablet Commonly known as:  LASIX Take 1 tablet (40 mg total) by mouth daily. For one week then stop. What changed:    medication strength  how much to take  additional instructions   hydroxychloroquine 200 MG tablet Commonly known as:  PLAQUENIL Take 400 mg by mouth daily.   metoprolol tartrate 25 MG tablet Commonly known as:  LOPRESSOR Take 1 tablet (25 mg total) by mouth 2 (two) times daily. What changed:    medication strength  how much to take   potassium chloride SA 20 MEQ tablet Commonly known as:  K-DUR,KLOR-CON Take 1 tablet (20 mEq total) by mouth daily. For one week then stop. What changed:    how much to take  how to take this  when to take this  additional instructions   predniSONE 20 MG tablet Commonly known as:  DELTASONE Take 1 tablet (20 mg total) by mouth daily with breakfast.   tamsulosin 0.4 MG Caps capsule Commonly known as:  FLOMAX Take 0.4 mg by mouth daily after breakfast.   traMADol 50 MG tablet Commonly known as:  ULTRAM Take 50 mg by mouth every 4-6 hours PRN moderate to  severe pain.      The patient has been discharged on:   1.Beta Blocker:  Yes [ x  ]                              No   [   ]                              If No, reason:  2.Ace Inhibitor/ARB: Yes [   ]  No  [   x ]                                     If No, reason:Allergy  3.Statin:   Yes [ x  ]                  No  [   ]                  If No, reason:  4.Ecasa:  Yes  [ x  ]                  No   [   ]                  If No, reason:  Follow Up Appointments: Follow-up Information    [name], MD. Go on 10/19/2017.   Specialty:  Cardiothoracic Surgery Why:  PA/LAT CXR to be taken (at [facility] which is in the same building as Dr. Sunday Corn office) on 10/19/2017 at 10:15 am ;Appointment time is at 10:45 am Contact information: [address] [employer] [place] [address] [phone]        Nurse. Go on 09/30/2017.   Why:  Appointment is with nurse only to have chest tube sutures removed. Appointment time is at 4:00 pm Contact information: [address] [place] [address]       Azalee Course, [place]. Go on 09/30/2017.   Specialties:  Cardiology, Radiology Why:  Appointment time is at 9:00 am Contact information: [address] [place] [address] [phone]           Signed: Lelon Huh [facility] 09/21/2017, 7:50 AM

## 2017-09-20 NOTE — Progress Notes (Signed)
Met with Luis Ferguson today to evaluate progress after re-do AVR. He is doing remarkably well. Troponin rose to 1.15, then down to 0.62, no chest pain other than with cough or motion. EKG personally reviewed, there is inferior ST elevation which is new - felt to be d/t pericarditis? Would repeat 12 lead EKG. He is on a steroid taper.  Mildly reduced hemoglobin today at 7.5 - monitor. Consider to transfuse 1U PRBC if he is below 7 tomorrow. Will follow peripherally, but available as needed.  Pixie Casino, MD, [facility], [place] Director of the Advanced Lipid Disorders &  Cardiovascular Risk Reduction Clinic Diplomate of the American Board of Clinical Lipidology Attending Cardiologist  Direct Dial: [phone]  Fax: [phone]  Website:  [email or web address]

## 2017-09-20 NOTE — Progress Notes (Addendum)
      [address]       [name] [address]             [phone]        3 Days Post-Op Procedure(s) (LRB): REDO AORTIC VALVE REPLACEMENT (AVR) (N/A) TRANSESOPHAGEAL ECHOCARDIOGRAM (TEE) (N/A)  Subjective: Patient about to eat breakfast. He has no specific complaints this am.  Objective: Vital signs in last 24 hours: Temp:  [97.5 F (36.4 C)-98.7 F (37.1 C)] 97.8 F (36.6 C) (02/25 0406) Pulse Rate:  [63-98] 82 (02/25 0406) Cardiac Rhythm: Normal sinus rhythm;Bundle branch block (02/25 0700) Resp:  [14-24] 18 (02/25 0406) BP: (128-161)/(70-91) 153/91 (02/25 0406) SpO2:  [96 %-99 %] 98 % (02/25 0406) Weight:  [170 lb 1.6 oz (77.2 kg)] 170 lb 1.6 oz (77.2 kg) (02/25 0406)  Pre op weight 74.4 kg Current Weight  09/20/17 170 lb 1.6 oz (77.2 kg)      Intake/Output from previous day: 02/24 0701 - 02/25 0700 In: 940 [P.O.:900; I.V.:40] Out: 995 [Urine:975; Chest Tube:20]   Physical Exam:  Cardiovascular: RRR, no murmur Pulmonary: Slightly diminished at bases bilaterally Abdomen: Soft, non tender, bowel sounds present. Extremities: Mild bilateral lower extremity edema.  Wounds: Aquacel removed and wound is clean and dry.    Lab Results: CBC: Recent Labs    09/19/17 0313 09/20/17 0225  WBC 14.2* 17.7*  HGB 8.0* 7.6*  HCT 25.2* 24.5*  PLT 105* 115*   BMET:  Recent Labs    09/19/17 0313 09/20/17 0225  NA 133* 135  K 4.4 4.3  CL 102 103  CO2 23 23  GLUCOSE 169* 113*  BUN 32* 33*  CREATININE 1.03 0.97  CALCIUM 7.5* 7.7*    PT/INR:  Lab Results  Component Value Date   INR 1.58 09/17/2017   INR 1.17 09/16/2017   INR 1.23 08/06/2017   ABG:  INR: Will add last result for INR, ABG once components are confirmed Will add last 4 CBG results once components are confirmed  Assessment/Plan:  1. CV - Hypertensive. On Lopressor 12.5 mg bid. Unable to start ACE as has had cough from it in the past. Will discuss with Dr. Dorris Fetch if  should start Amlodipine. Has had unchanged, slight ST segment elevation post op;troponins decreased. He has had no signs or symptoms of ischemia. 2.  Pulmonary - On room air. Encourage incentive spirometer. 3. Volume Overload - On Lasix 40 mg daily. 4.  Acute blood loss anemia - H and H decreased to 7.6 and 24.5 5. Thrombocytopenia-platelets 115,000 6. Continue Prednisone (on it most recently) for hemolytic anemia 7. Remove EPW later today  Donielle M ZimmermanPA-C 09/20/2017,7:31 AM Patient seen and examined, agree with above ST elevation likely due to pericarditis. Enzymes minimally elevated on Saturday c/w ischemic time during operation. No chest pain or incisional pain.  Hypertension- increase lopressor to 25 BID- was on 50 BID PTA Home in AM if no issues arise  Viviann Spare C. Dorris Fetch, MD [facility] [phone]

## 2017-09-20 NOTE — Discharge Instructions (Signed)
Aortic Valve Replacement, Care After °Refer to this sheet in the next few weeks. These instructions provide you with information about caring for yourself after your procedure. Your health care provider may also give you more specific instructions. Your treatment has been planned according to current medical practices, but problems sometimes occur. Call your health care provider if you have any problems or questions after your procedure. °What can I expect after the procedure? °After the procedure, it is common to have: °· Pain around your incision area. °· A small amount of blood or clear fluid coming from your incision. ° °Follow these instructions at home: °Eating and drinking ° °· Follow instructions from your health care provider about eating or drinking restrictions. °? Limit alcohol intake to no more than 1 drink per day for nonpregnant women and 2 drinks per day for men. One drink equals 12 oz of beer, 5 oz of wine, or 1½ oz of hard liquor. °? Limit how much caffeine you drink. Caffeine can affect your heart's rate and rhythm. °· Drink enough fluid to keep your urine clear or pale yellow. °· Eat a heart-healthy diet. This should include plenty of fresh fruits and vegetables. If you eat meat, it should be lean cuts. Avoid foods that are: °? High in salt, saturated fat, or sugar. °? Canned or highly processed. °? Fried. °Activity °· Return to your normal activities as told by your health care provider. Ask your health care provider what activities are safe for you. °· Exercise regularly once you have recovered, as told by your health care provider. °· Avoid sitting for more than 2 hours at a time without moving. Get up and move around at least once every 1-2 hours. This helps to prevent blood clots in the legs. °· Do not lift anything that is heavier than 10 lb (4.5 kg) until your health care provider approves. °· Avoid pushing or pulling things with your arms until your health care provider approves. This  includes pulling on handrails to help you climb stairs. °Incision care ° °· Follow instructions from your health care provider about how to take care of your incision. Make sure you: °? Wash your hands with soap and water before you change your bandage (dressing). If soap and water are not available, use hand sanitizer. °? Change your dressing as told by your health care provider. °? Leave stitches (sutures), skin glue, or adhesive strips in place. These skin closures may need to stay in place for 2 weeks or longer. If adhesive strip edges start to loosen and curl up, you may trim the loose edges. Do not remove adhesive strips completely unless your health care provider tells you to do that. °· Check your incision area every day for signs of infection. Check for: °? More redness, swelling, or pain. °? More fluid or blood. °? Warmth. °? Pus or a bad smell. °Medicines °· Take over-the-counter and prescription medicines only as told by your health care provider. °· If you were prescribed an antibiotic medicine, take it as told by your health care provider. Do not stop taking the antibiotic even if you start to feel better. °Travel °· Avoid airplane travel for as long as told by your health care provider. °· When you travel, bring a list of your medicines and a record of your medical history with you. Carry your medicines with you. °Driving °· Ask your health care provider when it is safe for you to drive. Do not drive until your health   care provider approves. °· Do not drive or operate heavy machinery while taking prescription pain medicine. °Lifestyle ° °· Do not use any tobacco products, such as cigarettes, chewing tobacco, or e-cigarettes. If you need help quitting, ask your health care provider. °· Resume sexual activity as told by your health care provider. Do not use medicines for erectile dysfunction unless your health care provider approves, if this applies. °· Work with your health care provider to keep your  blood pressure and cholesterol under control, and to manage any other heart conditions that you have. °· Maintain a healthy weight. °General instructions °· Do not take baths, swim, or use a hot tub until your health care provider approves. °· Do not strain to have a bowel movement. °· Avoid crossing your legs while sitting down. °· Check your temperature every day for a fever. A fever may be a sign of infection. °· If you are a woman and you plan to become pregnant, talk with your health care provider before you become pregnant. °· Wear compression stockings if your health care provider instructs you to do this. These stockings help to prevent blood clots and reduce swelling in your legs. °· Tell all health care providers who care for you that you have an artificial (prosthetic) aortic valve. If you have or have had heart disease or endocarditis, tell all health care providers about these conditions as well. °· Keep all follow-up visits as told by your health care provider. This is important. °Contact a health care provider if: °· You develop a skin rash. °· You experience sudden, unexplained changes in your weight. °· You have more redness, swelling, or pain around your incision. °· You have more fluid or blood coming from your incision. °· Your incision feels warm to the touch. °· You have pus or a bad smell coming from your incision. °· You have a fever. °Get help right away if: °· You develop chest pain that is different from the pain coming from your incision. °· You develop shortness of breath or difficulty breathing. °· You start to feel light-headed. °These symptoms may represent a serious problem that is an emergency. Do not wait to see if the symptoms will go away. Get medical help right away. Call your local emergency services (911 in the [place] Do not drive yourself to the hospital. °This information is not intended to replace advice given to you by your health care provider. Make sure you discuss any  questions you have with your health care provider. °Document Released: 01/29/2005 Document Revised: 12/19/2015 Document Reviewed: 06/16/2015 °Elsevier Interactive Patient Education © 2017 [employer] ° °

## 2017-09-20 NOTE — Op Note (Signed)
NAMESTANLEY, Luis Ferguson NO.:  [id number]  MEDICAL RECORD NO.:  [id number]  LOCATION:                                 FACILITY:  PHYSICIAN:  Salvatore Decent. Dorris Fetch, M.D. DATE OF BIRTH:  DATE OF PROCEDURE:  09/17/2017 DATE OF DISCHARGE:                              OPERATIVE REPORT   PREOPERATIVE DIAGNOSIS:  Paravalvular leak, status post aortic valve replacement.  POSTOPERATIVE DIAGNOSES: 1. Paravalvular leak, status post aortic valve replacement. 2. Valve leaflet perforation.  PROCEDURES:  Median sternotomy, redo aortic valve replacement with 23-mm Edwards Inspiris Resilia aortic valve, model #11500A, serial [id number].  SURGEON:  Salvatore Decent. Dorris Fetch, MD.  ASSISTANTDoree Fudge, PA.  ANESTHESIA:  General.  FINDINGS:  Paravalvular leak at the noncoronary side of the right/ noncoronary commissure with valve having slipped down into the outflow tract.  A small perforation in the midportion of the right leaflet of the Intuity prosthetic valve.  Post-bypass transesophageal echocardiography showed no paravalvular leak or aortic insufficiency after the replacement.  CLINICAL NOTE:  Luis Ferguson is a 57 year old gentleman who had undergone aortic valve replacement via right mini thoracotomy for severe AS about 2 months previously.  An Edwards Intuity stented valve was used.  Post discharge, the patient developed hemolytic anemia and was found to have a large paravalvular leak around the valve.  He was advised to undergo a median sternotomy for repair or redo aortic valve replacement.  The indications, risks, benefits, and alternatives were discussed in detail with the patient.  He understood and accepted the risks and agreed to proceed.  OPERATIVE NOTE:  Luis Ferguson was brought to the preoperative holding area on September 17, 2017.  Anesthesia placed a Swan-Ganz catheter and arterial blood pressure monitoring line.  He was taken to the operating room,  anesthetized, and intubated.  Intravenous antibiotics were administered.  A Foley catheter was placed.  The chest, abdomen, and legs were prepped and draped in usual sterile fashion.  Transesophageal echocardiography was performed by Dr. Adonis Huguenin of Anesthesia that revealed a paravalvular leak and also question of a more central aortic insufficiency as well.  This was much smaller than the paravalvular jet. There was no other significant valvular pathology.  A median sternotomy was performed.  The plate from the previous excision extended across the midline.  The most midline screw was removed and a small piece of the plate was cut off.  The remainder of the plate was left in place.  The sternotomy was performed.  Hemostasis was achieved. A retractor was placed.  The patient was fully heparinized.  The dissection began.  The pericardium was incised and came off the anterior portion of the heart rather easily.  There were extensive adhesions on the right side near the atrial appendage in the vicinity of the previous incision.  Heart was densely adherent in that area.  The dissection was carried up onto the aorta.  The aorta was cannulated via concentric 2-0 Ethibond pledgeted pursestring sutures after confirming adequate anticoagulation with ACT measurement.  Due to the difficulty of the dissection around the right atrium, the right common femoral vein was accessed using the Seldinger technique.  It was sequentially  dilated and a venous cannula was advanced into the superior vena cava. Cardiopulmonary bypass was initiated and the patient was cooled to 32 degrees Celsius.  Dissection continued and ultimately the area of dense adhesions was taken down.  The remainder of the heart was freed up relatively easily.  There was left ventricular hypertrophy.  A left ventricular vent was placed via pursestring suture in the right superior pulmonary vein.  A retrograde cardioplegia cannula was  placed via pursestring suture in the right atrium and directed into the coronary sinus and an antegrade cardioplegia cannula was placed in the ascending aorta.  A foam pad was placed in the pericardium to insulate the heart. A temperature probe was placed in myocardial septum.  Carbon dioxide was insufflated into the operative field.  The aorta was crossclamped.  The left ventricle was emptied via the aortic root vent and left ventricular vent. Cardiac arrest then was achieved with a combination of cold antegrade and retrograde blood cardioplegia.  With initial antegrade cardioplegia, there was a low root pressure, so the majority cardioplegia was given retrograde.  There was a diastolic arrest and septal cooling to 9 degrees Celsius.  Additional cardioplegia was administered at 15- to- 20-minute intervals.  An aortotomy was performed just distal to the previous aortotomy.  It was extended into the noncoronary sinus.  The aortic valve was inspected and there was a clear perforation in the right coronary leaflet of the Intuity valve.  This was about 2 mm in size.  At the right/ noncoronary commissure and along the noncoronary cusp in that area, the valve ring was below the anulus.  Because of the perforation of the cuff, decision was made that the valve would need to be removed and replaced.  The sutures securing the annular ring on the valve to the anulus were cut and removed.  The valve then was removed without difficulty and sent to Pathology.  The anulus sized for a 23-mm Inspiris Resilia Edwards bovine pericardial aortic valve.  2-0 Ethibond horizontal mattress sutures with subannular pledgets were placed circumferentially around the anulus.  A total of 14 sutures were utilized.  The sutures were placed through the sewing ring of the valve and the valve was lowered into place.  It seated nicely on the anulus.  The sutures were tied using the Cor-Knot device.  The coronary ostia were  inspected.  There was no impingement. The anulus was probed with a fine tip right-angle.  There were no gaps and as noted, the valve was seated nicely.  Rewarming was begun.  The aortotomy was closed in 2 layers with a running 4-0 Prolene suture.  The first layer was a running horizontal mattress suture.  At the completion of this layer, the patient was placed in Trendelenburg position and de- airing maneuvers were performed.  A warm dose of retrograde cardioplegia was administered.  As the second layer, a running simple suture was placed.  The patient was placed in Trendelenburg position.  The warm dose of retrograde cardioplegia was completed.  The aortic crossclamp was removed.  The total crossclamp time was 76 minutes.  While rewarming was completed, the aortotomy was inspected for hemostasis.  Epicardial pacing wires were placed on the right ventricle and right atrium.  The retrograde cardioplegia cannula was removed. Additional de-airing was performed as the left ventricular vent was removed.  DDD pacing was initiated.  A dopamine infusion was initiated at 3 mcg/kg/min.  When the patient had rewarmed to a core temperature of  37 degrees Celsius, he was weaned from cardiopulmonary bypass on the first attempt without difficulty.  The total bypass time was 129 minutes.  Once no residual air was visible on the transesophageal echocardiogram, the aortic root vent was removed.  The initial cardiac output was 3.4 L/min and that improved to 5 L/min with time and volume administration.  The patient was anemic and thrombocytopenic and 2 units of packed red blood cells and 1 bag of platelets were administered after completing the protamine infusion.  Post-bypass transesophageal echocardiography showed no paravalvular leaks with good function of the prosthetic valve.  A test dose of protamine was administered and it was well tolerated.  The venous cannula was removed from the groin and  pressure was held for 30 minutes. The aortic cannula was removed.  There was good hemostasis.  The remainder of the protamine was administered without incident.  The chest was copiously irrigated with warm saline.  Two mediastinal chest tubes were placed through separate subcostal incisions.  The pericardium was not reapproximated.  The sternum was closed with a combination of single and double heavy gauge stainless steel wires.  The pectoralis fascia, subcutaneous tissue, and skin were closed in a standard fashion.  All sponge, needle, and instrument counts were correct at the end of the procedure.  The patient was taken from the operating room to the Surgical Intensive Care Unit intubated and in good condition.     Salvatore Decent Dorris Fetch, M.D.     SCH/MEDQ  D:  09/20/2017  T:  09/20/2017  Job:  [id number]

## 2017-09-21 ENCOUNTER — Inpatient Hospital Stay (HOSPITAL_COMMUNITY): Payer: Commercial Managed Care - PPO

## 2017-09-21 DIAGNOSIS — D649 Anemia, unspecified: Secondary | ICD-10-CM

## 2017-09-21 DIAGNOSIS — I2119 ST elevation (STEMI) myocardial infarction involving other coronary artery of inferior wall: Secondary | ICD-10-CM | POA: Clinically undetermined

## 2017-09-21 DIAGNOSIS — I350 Nonrheumatic aortic (valve) stenosis: Secondary | ICD-10-CM

## 2017-09-21 LAB — GLUCOSE, CAPILLARY
Glucose-Capillary: 98 mg/dL (ref 65–99)
Glucose-Capillary: 95 mg/dL (ref 65–99)

## 2017-09-21 LAB — ECHOCARDIOGRAM COMPLETE
HEIGHTINCHES: 66 in
Weight: 2740.8 oz

## 2017-09-21 MED ORDER — PREDNISONE 20 MG PO TABS: 20.0000 mg | ORAL_TABLET | Freq: Every day | ORAL | 0 refills | Status: DC

## 2017-09-21 MED ORDER — POTASSIUM CHLORIDE CRYS ER 20 MEQ PO TBCR: 20.0000 meq | EXTENDED_RELEASE_TABLET | Freq: Every day | ORAL | 0 refills | Status: DC

## 2017-09-21 MED ORDER — FUROSEMIDE 40 MG PO TABS: 40.0000 mg | ORAL_TABLET | Freq: Every day | ORAL | 0 refills | Status: DC

## 2017-09-21 MED ORDER — METOPROLOL TARTRATE 25 MG PO TABS: 25.0000 mg | ORAL_TABLET | Freq: Two times a day (BID) | ORAL | 1 refills | Status: DC

## 2017-09-21 MED ORDER — TRAMADOL HCL 50 MG PO TABS: ORAL_TABLET | ORAL | 0 refills | Status: DC

## 2017-09-21 MED ORDER — ASPIRIN 325 MG PO TBEC: 325.0000 mg | DELAYED_RELEASE_TABLET | Freq: Every day | ORAL | 0 refills | Status: DC

## 2017-09-21 MED FILL — Magnesium Sulfate Inj 50%: INTRAMUSCULAR | Qty: 10 | Status: AC

## 2017-09-21 MED FILL — Heparin Sodium (Porcine) Inj 1000 Unit/ML: INTRAMUSCULAR | Qty: 20 | Status: AC

## 2017-09-21 MED FILL — Potassium Chloride Inj 2 mEq/ML: INTRAVENOUS | Qty: 40 | Status: AC

## 2017-09-21 MED FILL — Electrolyte-R (PH 7.4) Solution: INTRAVENOUS | Qty: 4000 | Status: AC

## 2017-09-21 MED FILL — Heparin Sodium (Porcine) Inj 1000 Unit/ML: INTRAMUSCULAR | Qty: 30 | Status: AC

## 2017-09-21 MED FILL — Sodium Chloride IV Soln 0.9%: INTRAVENOUS | Qty: 3000 | Status: AC

## 2017-09-21 MED FILL — Sodium Bicarbonate IV Soln 8.4%: INTRAVENOUS | Qty: 50 | Status: AC

## 2017-09-21 MED FILL — Lidocaine HCl IV Inj 20 MG/ML: INTRAVENOUS | Qty: 5 | Status: AC

## 2017-09-21 MED FILL — Mannitol IV Soln 20%: INTRAVENOUS | Qty: 500 | Status: AC

## 2017-09-21 NOTE — Progress Notes (Signed)
 DAILY PROGRESS NOTE   Patient Name: Luis Ferguson Date of Encounter: 09/21/2017  Chief Complaint   Wants to go home  Patient Profile   57 yo male patient with aortic stenosis s/p minimally invasive AVR with subsequent paravalvular leak, now with redo AVR by median sternotomy. Noted to have post-operative ST elevation inferiorly and small troponin elevation which declined.  Subjective   Luis Ferguson is without complaints today, however, appears pale and mildly diaphoretic. I have followed his EKG's yesterday and today which show persistent ST elevation inferiorly and now are showing inferior and lateral Q waves, more suggestive of evolving STEMI than pericarditis. Despite this, he reports being asymptomatic. He did have left chest ache some on Sunday afternoon. He has a resting sinus tachycardia today. Hemoglobin is drifting down to 7.6 today. WBC is rising - no fevers overnight.  Objective   Vitals:   09/20/17 2026 09/21/17 0557 09/21/17 0622 09/21/17 0721  BP: (!) 148/98  (!) 118/91 128/87  Pulse: (!) 114  93   Resp: (!) 28  (!) 23 (!) 21  Temp: 98.5 F (36.9 C)  97.8 F (36.6 C) 97.8 F (36.6 C)  TempSrc: Oral  Oral Oral  SpO2: 98%  98% 96%  Weight:  171 lb 4.8 oz (77.7 kg)    Height:       No intake or output data in the 24 hours ending 09/21/17 1117 Filed Weights   09/19/17 0600 09/20/17 0406 09/21/17 0557  Weight: 168 lb 3.4 oz (76.3 kg) 170 lb 1.6 oz (77.2 kg) 171 lb 4.8 oz (77.7 kg)    Physical Exam   General appearance: alert, no distress and pale Neck: no carotid bruit, no JVD and thyroid not enlarged, symmetric, no tenderness/mass/nodules Lungs: clear to auscultation bilaterally Heart: regular tachycardia Abdomen: soft, non-tender; bowel sounds normal; no masses,  no organomegaly Extremities: extremities normal, atraumatic, no cyanosis or edema Pulses: 2+ and symmetric Skin: pale, mildly diaphoretic Neurologic: Grossly normal Psych:  Frustrated  Inpatient Medications    Scheduled Meds: . acetaminophen  1,000 mg Oral Q6H  . aspirin EC  325 mg Oral Daily  . atorvastatin  20 mg Oral Daily  . bisacodyl  10 mg Oral Daily   Or  . bisacodyl  10 mg Rectal Daily  . docusate sodium  200 mg Oral Daily  . furosemide  40 mg Oral Daily  . insulin aspart  0-9 Units Subcutaneous TID WC  . metoprolol tartrate  25 mg Oral BID  . omega-3 acid ethyl esters  1 g Oral Daily  . pantoprazole  40 mg Oral Daily  . potassium chloride  20 mEq Oral Daily  . predniSONE  20 mg Oral Q breakfast  . protein supplement shake  11 oz Oral Q1500  . scopolamine  1 patch Transdermal Q72H  . sodium chloride flush  3 mL Intravenous Q12H  . tamsulosin  0.4 mg Oral QPC breakfast    Continuous Infusions: . sodium chloride Stopped (09/18/17 0636)  . lactated ringers      PRN Meds: metoprolol tartrate, morphine injection, ondansetron (ZOFRAN) IV, sodium chloride flush, traMADol   Labs   Results for orders placed or performed during the hospital encounter of 09/17/17 (from the past 48 hour(s))  Glucose, capillary     Status: Abnormal   Collection Time: 09/19/17 11:59 AM  Result Value Ref Range   Glucose-Capillary 175 (H) 65 - 99 mg/dL   Comment 1 Capillary Specimen   Glucose, capillary       Status: Abnormal   Collection Time: 09/19/17  4:36 PM  Result Value Ref Range   Glucose-Capillary 192 (H) 65 - 99 mg/dL   Comment 1 Notify RN   Glucose, capillary     Status: Abnormal   Collection Time: 09/19/17  9:04 PM  Result Value Ref Range   Glucose-Capillary 179 (H) 65 - 99 mg/dL  CBC with Differential     Status: Abnormal   Collection Time: 09/20/17  2:25 AM  Result Value Ref Range   WBC 17.7 (H) 4.0 - 10.5 K/uL   RBC 2.50 (L) 4.22 - 5.81 MIL/uL   Hemoglobin 7.6 (L) 13.0 - 17.0 g/dL   HCT 24.5 (L) 39.0 - 52.0 %   MCV 98.0 78.0 - 100.0 fL   MCH 30.4 26.0 - 34.0 pg   MCHC 31.0 30.0 - 36.0 g/dL   RDW 19.6 (H) 11.5 - 15.5 %   Platelets 115 (L)  150 - 400 K/uL    Comment: CONSISTENT WITH PREVIOUS RESULT   Neutrophils Relative % 82 %   Neutro Abs 14.5 (H) 1.7 - 7.7 K/uL   Lymphocytes Relative 12 %   Lymphs Abs 2.1 0.7 - 4.0 K/uL   Monocytes Relative 6 %   Monocytes Absolute 1.0 0.1 - 1.0 K/uL   Eosinophils Relative 0 %   Eosinophils Absolute 0.0 0.0 - 0.7 K/uL   Basophils Relative 0 %   Basophils Absolute 0.0 0.0 - 0.1 K/uL    Comment: Performed at [facility], [address] [place], [place] [address]  Reticulocytes     Status: Abnormal   Collection Time: 09/20/17  2:25 AM  Result Value Ref Range   Retic Ct Pct 7.4 (H) 0.4 - 3.1 %   RBC. 2.50 (L) 4.22 - 5.81 MIL/uL   Retic Count, Absolute 185.0 19.0 - 186.0 K/uL    Comment: Performed at [facility], [address] [place], [place] [address]  Lactate dehydrogenase     Status: Abnormal   Collection Time: 09/20/17  2:25 AM  Result Value Ref Range   LDH 727 (H) 98 - 192 U/L    Comment: Performed at [facility], [address] [place], [place] [address]  Basic metabolic panel     Status: Abnormal   Collection Time: 09/20/17  2:25 AM  Result Value Ref Range   Sodium 135 135 - 145 mmol/L   Potassium 4.3 3.5 - 5.1 mmol/L   Chloride 103 101 - 111 mmol/L   CO2 23 22 - 32 mmol/L   Glucose, Bld 113 (H) 65 - 99 mg/dL   BUN 33 (H) 6 - 20 mg/dL   Creatinine, Ser 0.97 0.61 - 1.24 mg/dL   Calcium 7.7 (L) 8.9 - 10.3 mg/dL   GFR calc non Af Amer >60 >60 mL/min   GFR calc Af Amer >60 >60 mL/min    Comment: (NOTE) The eGFR has been calculated using the CKD EPI equation. This calculation has not been validated in all clinical situations. eGFR's persistently <60 mL/min signify possible Chronic Kidney Disease.    Anion gap 9 5 - 15    Comment: Performed at [facility], [address] [place], [place] [address]  Glucose, capillary     Status: None   Collection Time: 09/20/17  5:53 AM  Result Value Ref Range   Glucose-Capillary 90 65 - 99 mg/dL   Glucose, capillary     Status: Abnormal   Collection Time: 09/20/17 11:51 AM    Result Value Ref Range   Glucose-Capillary 103 (H) 65 - 99 mg/dL   Comment 1 Notify RN    Comment 2 Document in Chart   Glucose, capillary     Status: Abnormal   Collection Time: 09/20/17  4:35 PM  Result Value Ref Range   Glucose-Capillary 131 (H) 65 - 99 mg/dL  Glucose, capillary     Status: Abnormal   Collection Time: 09/20/17  8:36 PM  Result Value Ref Range   Glucose-Capillary 138 (H) 65 - 99 mg/dL  Glucose, capillary     Status: None   Collection Time: 09/21/17  6:07 AM  Result Value Ref Range   Glucose-Capillary 98 65 - 99 mg/dL    ECG   Sinus tachycardia at 100 with inferior and lateral ST elevation and small Q waves, suggestive of recent inferior injury pattern - Personally Reviewed  Telemetry   Sinus tachycardia - Personally Reviewed  Radiology    No results found.  Cardiac Studies   N/A  Assessment   Active Problems:   Symptomatic anemia   S/P AVR (aortic valve replacement)   Paravalvular leak (prosthetic valve), initial encounter   ST elevation myocardial infarction (STEMI) of inferolateral wall [facility])   Plan   Mr. Belfield has persistent inferior ST elevation with Q waves, suggestive of recent injury and evolving MI - he had no significant CAD prior to surgery. ?if there was a thrombotic event which may have lead to this. Troponin has already started to fall and given lack of chest pain, would not recommend cath. The other possibility is pericarditis - however, the Q waves are not typical of this and ST elevation is usually more diffuse. There is a rising leukocytosis, which may be stress. Hemoglobin is drifting down - ?if this is related to his rest tachycardia or if he is dry. Would recommend an echo today to evaluate for new WMA, LV function, r/o pericardial effusion and assess post-AVR valve gradient. Communicated intentions to Dr. Roxan Hockey via Thurmond Butts in the [place] surgery office  and to the patient and his wife at bedside.  Time Spent Directly with Patient:  I have spent a total of 35 minutes with the patient reviewing hospital notes, telemetry, EKGs, labs and examining the patient as well as establishing an assessment and plan that was discussed personally with the patient. > 50% of time was spent in direct patient care.  Length of Stay:  LOS: 4 days   Pixie Casino, MD, [facility], Day Director of the [facility]  [facility] Diplomate of the American Board of Clinical Lipidology Attending Cardiologist  Direct Dial: [phone]  Fax: [phone]  Website:  [email or web address] Kelee Cunningham 09/21/2017, 11:17 AM

## 2017-09-21 NOTE — Progress Notes (Signed)
Chest tube sutures removed as ordered, steri tapes applied, will continue to monitor.

## 2017-09-21 NOTE — Progress Notes (Signed)
  Echocardiogram 2D Echocardiogram has been performed.  Tye Savoy 09/21/2017, 2:13 PM

## 2017-09-21 NOTE — Progress Notes (Signed)
CARDIAC REHAB PHASE I   PRE:  Rate/Rhythm: 98 SR     BP: sitting 107/70    SaO2: 97 RA  MODE:  Ambulation: 750 ft   POST:  Rate/Rhythm: 113 ST    BP: sitting 133/90     SaO2: 96 RA  Pt anxious but denies any pain. Able to walk with less SOB today, HR not as high. BP elevated after walking. Ed completed with pt and wife. Will refer to [facility] CRPII.  [id number]   Harriet Masson CES, ACSM 09/21/2017 12:15 PM

## 2017-09-21 NOTE — Progress Notes (Addendum)
      [address]       [name] [address]             [phone]        4 Days Post-Op Procedure(s) (LRB): REDO AORTIC VALVE REPLACEMENT (AVR) (N/A) TRANSESOPHAGEAL ECHOCARDIOGRAM (TEE) (N/A)  Subjective: Patient without complaints this am. He wants to go home.  Objective: Vital signs in last 24 hours: Temp:  [97.5 F (36.4 C)-98.7 F (37.1 C)] 97.8 F (36.6 C) (02/26 0622) Pulse Rate:  [85-114] 93 (02/26 0622) Cardiac Rhythm: Normal sinus rhythm (02/26 0713) Resp:  [17-29] 23 (02/26 0622) BP: (117-148)/(84-102) 118/91 (02/26 0622) SpO2:  [93 %-100 %] 98 % (02/26 0622) Weight:  [171 lb 4.8 oz (77.7 kg)] 171 lb 4.8 oz (77.7 kg) (02/26 0557)  Pre op weight 74.4 kg Current Weight  09/21/17 171 lb 4.8 oz (77.7 kg)      Intake/Output from previous day: 02/25 0701 - 02/26 0700 In: 240 [P.O.:240] Out: -    Physical Exam:  Cardiovascular: RRR, no murmur Pulmonary: Slightly diminished at bases bilaterally Abdomen: Soft, non tender, bowel sounds present. Extremities: Mild bilateral lower extremity edema.  Wounds: Aquacel removed and wound is clean and dry.    Lab Results: CBC: Recent Labs    09/19/17 0313 09/20/17 0225  WBC 14.2* 17.7*  HGB 8.0* 7.6*  HCT 25.2* 24.5*  PLT 105* 115*   BMET:  Recent Labs    09/19/17 0313 09/20/17 0225  NA 133* 135  K 4.4 4.3  CL 102 103  CO2 23 23  GLUCOSE 169* 113*  BUN 32* 33*  CREATININE 1.03 0.97  CALCIUM 7.5* 7.7*    PT/INR:  Lab Results  Component Value Date   INR 1.58 09/17/2017   INR 1.17 09/16/2017   INR 1.23 08/06/2017   ABG:  INR: Will add last result for INR, ABG once components are confirmed Will add last 4 CBG results once components are confirmed  Assessment/Plan:  1. CV - Hypertensive. On Lopressor 25 mg bid. Has had unchanged, slight ST segment elevation post op;troponins decreased. He has had no signs or symptoms of ischemia. 2.  Pulmonary - On room air. Encourage  incentive spirometer. 3. Volume Overload - On Lasix 40 mg daily. 4.  Acute blood loss anemia - H and H decreased to 7.6 and 24.5 5. Thrombocytopenia-platelets 115,000 6. Continue Prednisone (on it most recently) for hemolytic anemia 7. Chest tube sutures to remain. Will remove in office 8. Discharge   Lelon Huh [facility] 09/21/2017,7:23 AM  Patient seen and examined, agree with above D/w Dr. Rennis Golden- he would like to see one more ECG - will check this AM Home later if no issues  Viviann Spare C. Dorris Fetch, MD [facility] [phone]

## 2017-09-21 NOTE — Progress Notes (Signed)
Patient in a stable condition, discharge education reviewed with patient and his wife at bedside, they verbalized understanding, iv removed, tele dc ccmd notified, patient belongings at bedside, patient's wife to transport patient home.

## 2017-09-22 ENCOUNTER — Inpatient Hospital Stay: Payer: Commercial Managed Care - PPO

## 2017-09-22 DIAGNOSIS — I350 Nonrheumatic aortic (valve) stenosis: Secondary | ICD-10-CM

## 2017-09-22 DIAGNOSIS — D594 Other nonautoimmune hemolytic anemias: Secondary | ICD-10-CM

## 2017-09-22 DIAGNOSIS — Q231 Congenital insufficiency of aortic valve: Secondary | ICD-10-CM

## 2017-09-22 DIAGNOSIS — L930 Discoid lupus erythematosus: Secondary | ICD-10-CM | POA: Diagnosis not present

## 2017-09-22 DIAGNOSIS — Z952 Presence of prosthetic heart valve: Secondary | ICD-10-CM

## 2017-09-22 DIAGNOSIS — R059 Cough, unspecified: Secondary | ICD-10-CM

## 2017-09-22 DIAGNOSIS — Q2381 Bicuspid aortic valve: Secondary | ICD-10-CM

## 2017-09-22 DIAGNOSIS — K2100 Gastro-esophageal reflux disease with esophagitis, without bleeding: Secondary | ICD-10-CM

## 2017-09-22 DIAGNOSIS — D649 Anemia, unspecified: Secondary | ICD-10-CM

## 2017-09-22 DIAGNOSIS — R05 Cough: Secondary | ICD-10-CM

## 2017-09-22 DIAGNOSIS — E785 Hyperlipidemia, unspecified: Secondary | ICD-10-CM

## 2017-09-22 DIAGNOSIS — D500 Iron deficiency anemia secondary to blood loss (chronic): Secondary | ICD-10-CM

## 2017-09-22 DIAGNOSIS — D591 Other autoimmune hemolytic anemias: Secondary | ICD-10-CM | POA: Diagnosis present

## 2017-09-22 DIAGNOSIS — M25473 Effusion, unspecified ankle: Secondary | ICD-10-CM | POA: Diagnosis not present

## 2017-09-22 DIAGNOSIS — D590 Drug-induced autoimmune hemolytic anemia: Secondary | ICD-10-CM

## 2017-09-22 LAB — COMPREHENSIVE METABOLIC PANEL
ALT: 72 U/L — ABNORMAL HIGH (ref 0–55)
AST: 38 U/L — ABNORMAL HIGH (ref 5–34)
Albumin: 3 g/dL — ABNORMAL LOW (ref 3.5–5.0)
Alkaline Phosphatase: 90 U/L (ref 40–150)
Anion gap: 7 (ref 3–11)
BUN: 21 mg/dL (ref 7–26)
CHLORIDE: 102 mmol/L (ref 98–109)
CO2: 27 mmol/L (ref 22–29)
CREATININE: 0.77 mg/dL (ref 0.70–1.30)
Calcium: 8.3 mg/dL — ABNORMAL LOW (ref 8.4–10.4)
Glucose, Bld: 126 mg/dL (ref 70–140)
POTASSIUM: 4.4 mmol/L (ref 3.5–5.1)
SODIUM: 136 mmol/L (ref 136–145)
Total Bilirubin: 0.9 mg/dL (ref 0.2–1.2)
Total Protein: 5.7 g/dL — ABNORMAL LOW (ref 6.4–8.3)

## 2017-09-22 LAB — CBC WITH DIFFERENTIAL/PLATELET
Basophils Absolute: 0 10*3/uL (ref 0.0–0.1)
Basophils Relative: 0 %
EOS ABS: 0 10*3/uL (ref 0.0–0.5)
Eosinophils Relative: 0 %
HEMATOCRIT: 25.4 % — AB (ref 38.4–49.9)
HEMOGLOBIN: 7.7 g/dL — AB (ref 13.0–17.1)
LYMPHS ABS: 0.6 10*3/uL — AB (ref 0.9–3.3)
LYMPHS PCT: 6 %
MCH: 30.3 pg (ref 27.2–33.4)
MCHC: 30.3 g/dL — ABNORMAL LOW (ref 32.0–36.0)
MCV: 100 fL — ABNORMAL HIGH (ref 79.3–98.0)
Monocytes Absolute: 0.4 10*3/uL (ref 0.1–0.9)
Monocytes Relative: 4 %
NEUTROS ABS: 8.9 10*3/uL — AB (ref 1.5–6.5)
NEUTROS PCT: 90 %
Platelets: 164 10*3/uL (ref 140–400)
RBC: 2.54 MIL/uL — AB (ref 4.20–5.82)
RDW: 18.4 % — ABNORMAL HIGH (ref 11.0–14.6)
WBC: 9.9 10*3/uL (ref 4.0–10.3)

## 2017-09-22 LAB — LACTATE DEHYDROGENASE: LDH: 838 U/L — AB (ref 125–245)

## 2017-09-22 LAB — RETICULOCYTES
RBC.: 2.54 MIL/uL — ABNORMAL LOW (ref 4.20–5.82)
RETIC COUNT ABSOLUTE: 154.9 10*3/uL — AB (ref 34.8–93.9)
Retic Ct Pct: 6.1 % — ABNORMAL HIGH (ref 0.8–1.8)

## 2017-09-23 ENCOUNTER — Encounter (HOSPITAL_COMMUNITY): Payer: Self-pay | Admitting: Thoracic Surgery (Cardiothoracic Vascular Surgery)

## 2017-09-23 LAB — HAPTOGLOBIN

## 2017-09-29 ENCOUNTER — Telehealth (HOSPITAL_COMMUNITY): Payer: Self-pay

## 2017-09-29 ENCOUNTER — Inpatient Hospital Stay: Payer: Commercial Managed Care - PPO | Attending: Adult Health

## 2017-09-29 DIAGNOSIS — I219 Acute myocardial infarction, unspecified: Secondary | ICD-10-CM | POA: Insufficient documentation

## 2017-09-29 DIAGNOSIS — D696 Thrombocytopenia, unspecified: Secondary | ICD-10-CM | POA: Insufficient documentation

## 2017-09-29 DIAGNOSIS — Y839 Surgical procedure, unspecified as the cause of abnormal reaction of the patient, or of later complication, without mention of misadventure at the time of the procedure: Secondary | ICD-10-CM | POA: Diagnosis not present

## 2017-09-29 DIAGNOSIS — Q2381 Bicuspid aortic valve: Secondary | ICD-10-CM

## 2017-09-29 DIAGNOSIS — Z952 Presence of prosthetic heart valve: Secondary | ICD-10-CM

## 2017-09-29 DIAGNOSIS — K2100 Gastro-esophageal reflux disease with esophagitis, without bleeding: Secondary | ICD-10-CM

## 2017-09-29 DIAGNOSIS — D591 Other autoimmune hemolytic anemias: Secondary | ICD-10-CM | POA: Diagnosis not present

## 2017-09-29 DIAGNOSIS — D590 Drug-induced autoimmune hemolytic anemia: Secondary | ICD-10-CM

## 2017-09-29 DIAGNOSIS — D649 Anemia, unspecified: Secondary | ICD-10-CM

## 2017-09-29 DIAGNOSIS — R059 Cough, unspecified: Secondary | ICD-10-CM

## 2017-09-29 DIAGNOSIS — R05 Cough: Secondary | ICD-10-CM

## 2017-09-29 DIAGNOSIS — I350 Nonrheumatic aortic (valve) stenosis: Secondary | ICD-10-CM

## 2017-09-29 DIAGNOSIS — D500 Iron deficiency anemia secondary to blood loss (chronic): Secondary | ICD-10-CM

## 2017-09-29 DIAGNOSIS — T8203XA Leakage of heart valve prosthesis, initial encounter: Secondary | ICD-10-CM | POA: Insufficient documentation

## 2017-09-29 DIAGNOSIS — L930 Discoid lupus erythematosus: Secondary | ICD-10-CM

## 2017-09-29 DIAGNOSIS — Q231 Congenital insufficiency of aortic valve: Secondary | ICD-10-CM

## 2017-09-29 DIAGNOSIS — D594 Other nonautoimmune hemolytic anemias: Secondary | ICD-10-CM

## 2017-09-29 DIAGNOSIS — E785 Hyperlipidemia, unspecified: Secondary | ICD-10-CM

## 2017-09-29 LAB — RETICULOCYTES
RBC.: 2.88 MIL/uL — ABNORMAL LOW (ref 4.22–5.81)
RETIC CT PCT: 6.9 % — AB (ref 0.4–3.1)
Retic Count, Absolute: 198.7 10*3/uL — ABNORMAL HIGH (ref 19.0–186.0)

## 2017-09-29 LAB — COMPREHENSIVE METABOLIC PANEL
ALBUMIN: 3.3 g/dL — AB (ref 3.5–5.0)
ALK PHOS: 75 U/L (ref 40–150)
ALT: 68 U/L — ABNORMAL HIGH (ref 0–55)
AST: 18 U/L (ref 5–34)
Anion gap: 7 (ref 3–11)
BILIRUBIN TOTAL: 0.4 mg/dL (ref 0.2–1.2)
BUN: 17 mg/dL (ref 7–26)
CALCIUM: 9.1 mg/dL (ref 8.4–10.4)
CO2: 28 mmol/L (ref 22–29)
Chloride: 105 mmol/L (ref 98–109)
Creatinine, Ser: 1.16 mg/dL (ref 0.70–1.30)
GFR calc Af Amer: >60 mL/min (ref >60)
GFR calc non Af Amer: >60 mL/min (ref >60)
GLUCOSE: 105 mg/dL (ref 70–140)
POTASSIUM: 4.4 mmol/L (ref 3.5–5.1)
Sodium: 140 mmol/L (ref 136–145)
TOTAL PROTEIN: 6.2 g/dL — AB (ref 6.4–8.3)

## 2017-09-29 LAB — CBC WITH DIFFERENTIAL/PLATELET
BASOS PCT: 0 %
Basophils Absolute: 0 10*3/uL (ref 0.0–0.1)
Eosinophils Absolute: 0 10*3/uL (ref 0.0–0.5)
Eosinophils Relative: 0 %
HEMATOCRIT: 29.5 % — AB (ref 38.4–49.9)
Hemoglobin: 8.7 g/dL — ABNORMAL LOW (ref 13.0–17.1)
LYMPHS ABS: 0.7 10*3/uL — AB (ref 0.9–3.3)
Lymphocytes Relative: 7 %
MCH: 29.3 pg (ref 27.2–33.4)
MCHC: 29.5 g/dL — AB (ref 32.0–36.0)
MCV: 99.3 fL — ABNORMAL HIGH (ref 79.3–98.0)
MONO ABS: 0.4 10*3/uL (ref 0.1–0.9)
Monocytes Relative: 4 %
NEUTROS ABS: 9.5 10*3/uL — AB (ref 1.5–6.5)
Neutrophils Relative %: 89 %
Platelets: 336 10*3/uL (ref 140–400)
RBC: 2.97 MIL/uL — ABNORMAL LOW (ref 4.20–5.82)
RDW: 17.3 % — AB (ref 11.0–14.6)
WBC: 10.7 10*3/uL — ABNORMAL HIGH (ref 4.0–10.3)

## 2017-09-29 LAB — LACTATE DEHYDROGENASE: LDH: 523 U/L — ABNORMAL HIGH (ref 125–245)

## 2017-09-29 NOTE — Telephone Encounter (Signed)
Attempted to call patient to see if interested in CR - lm on vm °

## 2017-09-29 NOTE — Telephone Encounter (Signed)
Patients insurance is active and benefits verified through [employer] - No co-pay, deductible amount of $1,400/$1,400 has been met, out of pocket amount of $3,400/$3,400 has been met, 20% co-insurance, and no pre-authorization is required. Spoke with [employer] - Reference #[id number] ° °Will contact patient to see if interested in the program. If interested, patient will need to complete follow up appt. Once complete, patient will be contacted for scheduling upon review by the RN Navigator. °

## 2017-09-30 ENCOUNTER — Ambulatory Visit: Payer: Commercial Managed Care - PPO | Admitting: Physician Assistant

## 2017-09-30 ENCOUNTER — Other Ambulatory Visit: Payer: Self-pay | Admitting: Oncology

## 2017-09-30 ENCOUNTER — Encounter: Payer: Self-pay | Admitting: Physician Assistant

## 2017-09-30 VITALS — BP 142/70 | HR 70 | Ht 66.0 in | Wt 163.0 lb

## 2017-09-30 DIAGNOSIS — D649 Anemia, unspecified: Secondary | ICD-10-CM | POA: Diagnosis not present

## 2017-09-30 DIAGNOSIS — I2119 ST elevation (STEMI) myocardial infarction involving other coronary artery of inferior wall: Secondary | ICD-10-CM

## 2017-09-30 DIAGNOSIS — Z952 Presence of prosthetic heart valve: Secondary | ICD-10-CM

## 2017-09-30 DIAGNOSIS — N289 Disorder of kidney and ureter, unspecified: Secondary | ICD-10-CM | POA: Diagnosis not present

## 2017-09-30 LAB — HAPTOGLOBIN: Haptoglobin: 37 mg/dL (ref 34–200)

## 2017-09-30 MED ORDER — METOPROLOL TARTRATE 50 MG PO TABS: 25.0000 mg | ORAL_TABLET | Freq: Two times a day (BID) | ORAL | 2 refills | Status: DC

## 2017-09-30 MED ORDER — METOPROLOL TARTRATE 50 MG PO TABS: 25.0000 mg | ORAL_TABLET | Freq: Two times a day (BID) | ORAL | 0 refills | Status: DC

## 2017-09-30 NOTE — Patient Instructions (Signed)
Medication Instructions:  Your physician has recommended you make the following change in your medication:  1) INCREASE Lopressor to 50 mg tablet by mouth TWICE daily   Labwork: none  Testing/Procedures: none  Follow-Up: Your physician recommends that you schedule a follow-up appointment in: 2-3 months with Dr. Rennis Golden.   Any Other Special Instructions Will Be Listed Below (If Applicable).     If you need a refill on your cardiac medications before your next appointment, please call your pharmacy.

## 2017-09-30 NOTE — Progress Notes (Addendum)
Cardiology Office Note    Date:  10/01/2017   ID:  [name], DOB September 20, 1960, MRN [id number]  PCP:  Milus Height, PA-C  Cardiologist: Dr. Rennis Golden  Chief Complaint  Patient presents with  . Follow-up    seen for Dr. Rennis Golden, post AVR    History of Present Illness:  Luis Ferguson is a 57 y.o. male with PMH of connective tissue disorder and aortic stenosis s/p AVR.  Cardiac catheterization on 06/16/2017 showed normal coronaries.  Patient previously had minimally invasive aortic valve replacement by Dr. Dorris Fetch on 07/12/2017 through a right mini thoracotomy.  Unfortunately, there was small to moderate perivalvular leak noted after the chest closure.  Patient struggled with symptom of heart failure as well as symptomatic anemia afterward.  It was felt that his hemolysis is related to perivalvular leak.  TEE performed on 09/09/2017 showed severe perivalvular leak on the medial aspect of prosthetic ring.  Normal LV function.  He eventually was brought back to the hospital on 09/15/2017 to undergo redo aortic valve replacement via median sternotomy using size 23 Inspiris Resilia pericardial aortic valve.  Post hospital course is complicated by elevation of the troponin up to 1.15, there was also some inferior ST elevation which was felt to be due to possible pericarditis.  He was on steroid at the time for hemolytic anemia.  Echocardiogram on 09/21/2017 showed EF 55-60%, paradoxical septal wall motion, bioprosthetic aortic valve functioning normally, small pericardial effusion was identified posterior to the heart.  Patient presents today for cardiology office visit.  Based on recent blood work, his creatinine increased slightly and his hemoglobin also improved from 7.7 to 8.7 yesterday.  Elevation of creatinine is likely related to his recent diuretic use, he has completed the last of Lasix yesterday and is no longer on Lasix or potassium supplement.  He is feeling very well and able to walk on  the treadmill for 30-45 minutes each day.  He denies significant shortness of breath, lower extremity edema, orthopnea or PND.  He does not have any recent chest pain.  EKG obtained today showed no significant ST changes like that was seen after his surgery.  His blood pressure is mildly elevated today, I will increase his metoprolol to 50 mg twice daily. Otherwise I think he is doing quite well after the surgery.   Past Medical History:  Diagnosis Date  . Anemia   . Aortic stenosis   . Arthritis    achiness in shoulder , elbows  . Connective tissue disease (HCC)    followed by Dr. Kathi Ludwig, G[facility], pt. off Prednisone since mid 2017  . Fatigue   . Headache   . Perivalvular leak of prosthetic heart valve   . PONV (postoperative nausea and vomiting)   . Psoriasis    below the knee on both legs   . Wears glasses     Past Surgical History:  Procedure Laterality Date  . AORTIC VALVE REPLACEMENT  06/2017  . AORTIC VALVE REPLACEMENT N/A 09/17/2017   Procedure: REDO AORTIC VALVE REPLACEMENT (AVR);  Surgeon: [name], MD;  Location: [facility];  Service: Open Heart Surgery;  Laterality: N/A;  . CARDIAC CATHETERIZATION    . HERNIA REPAIR  2016   left and right  . RIGHT/LEFT HEART CATH AND CORONARY ANGIOGRAPHY N/A 06/16/2017   Procedure: RIGHT/LEFT HEART CATH AND CORONARY ANGIOGRAPHY;  Surgeon: Lyn Records, MD;  Location: [facility] INVASIVE CV LAB;  Service: Cardiovascular;  Laterality: N/A;  . TEE WITHOUT  CARDIOVERSION N/A 07/12/2017   Procedure: TRANSESOPHAGEAL ECHOCARDIOGRAM (TEE);  Surgeon: [name], MD;  Location: [facility] OR;  Service: Open Heart Surgery;  Laterality: N/A;  . TEE WITHOUT CARDIOVERSION N/A 09/09/2017   Procedure: TRANSESOPHAGEAL ECHOCARDIOGRAM (TEE);  Surgeon: Thurmon Fair, MD;  Location: [facility] ENDOSCOPY;  Service: Cardiovascular;  Laterality: N/A;  . TEE WITHOUT CARDIOVERSION N/A 09/17/2017   Procedure: TRANSESOPHAGEAL ECHOCARDIOGRAM (TEE);  Surgeon: [name], MD;  Location: [facility] OR;  Service: Open Heart Surgery;  Laterality: N/A;    Current Medications: Outpatient Medications Prior to Visit  Medication Sig Dispense Refill  . aspirin EC 325 MG EC tablet Take 1 tablet (325 mg total) by mouth daily. 30 tablet 0  . atorvastatin (LIPITOR) 20 MG tablet Take 20 mg by mouth daily.     . calcipotriene (DOVONOX) 0.005 % cream Apply 1 application topically 2 (two) times daily as needed (psoriasis).     . cholecalciferol (VITAMIN D) 1000 UNITS tablet Take 1,000 Units by mouth daily.    . clobetasol ointment (TEMOVATE) 0.05 % Apply 1 application topically 2 (two) times daily as needed (psoriasis).     . hydroxychloroquine (PLAQUENIL) 200 MG tablet Take 400 mg by mouth daily.     . Omega-3 Fatty Acids (FISH OIL PO) Take 360 mg by mouth daily.    . predniSONE (DELTASONE) 20 MG tablet Take 1 tablet (20 mg total) by mouth daily with breakfast. 30 tablet 0  . tamsulosin (FLOMAX) 0.4 MG CAPS capsule Take 0.4 mg by mouth daily after breakfast.     . traMADol (ULTRAM) 50 MG tablet Take 50 mg by mouth every 4-6 hours PRN moderate to severe pain. 30 tablet 0  . metoprolol tartrate (LOPRESSOR) 25 MG tablet Take 1 tablet (25 mg total) by mouth 2 (two) times daily. 60 tablet 1  . furosemide (LASIX) 40 MG tablet Take 1 tablet (40 mg total) by mouth daily. For one week then stop. 7 tablet 0  . potassium chloride SA (K-DUR,KLOR-CON) 20 MEQ tablet Take 1 tablet (20 mEq total) by mouth daily. For one week then stop. 7 tablet 0   No facility-administered medications prior to visit.      Allergies:   Ace inhibitors and Codeine   Social History   Socioeconomic History  . Marital status: Married    Spouse name: None  . Number of children: None  . Years of education: None  . Highest education level: None  Social Needs  . Financial resource strain: None  . Food insecurity - worry: None  . Food insecurity - inability: None  . Transportation needs - medical: None    . Transportation needs - non-medical: None  Occupational History  . None  Tobacco Use  . Smoking status: Never Smoker  . Smokeless tobacco: Never Used  Substance and Sexual Activity  . Alcohol use: Yes    Comment: rare  . Drug use: No  . Sexual activity: Yes  Other Topics Concern  . None  Social History Narrative  . None     Family History:  [name] family history includes CAD (age of onset: 63) in his father; Dementia in his father; Diabetes in his brother and mother; Liver cancer in his brother.   ROS:   Please see the history of present illness.    ROS All other systems reviewed and are negative.   PHYSICAL EXAM:   VS:  BP (!) 142/70   Pulse 70   Ht 5\' 6"  (1.676 m)  Wt 163 lb (73.9 kg)   BMI 26.31 kg/m    GEN: Well nourished, well developed, in no acute distress  HEENT: normal  Neck: no JVD, carotid bruits, or masses Cardiac: RRR; no rubs, or gallops,no edema  1/6 systolic murmur at apex Respiratory:  clear to auscultation bilaterally, normal work of breathing GI: soft, nontender, nondistended, + BS MS: no deformity or atrophy  Skin: warm and dry, no rash Neuro:  Alert and Oriented x 3, Strength and sensation are intact Psych: euthymic mood, full affect  Wt Readings from Last 3 Encounters:  09/30/17 163 lb (73.9 kg)  09/21/17 171 lb 4.8 oz (77.7 kg)  09/16/17 164 lb 4.8 oz (74.5 kg)      Studies/Labs Reviewed:   EKG:  EKG is ordered today.  The ekg ordered today demonstrates normal sinus rhythm without significant ST-T wave changes  Recent Labs: 06/15/2017: TSH 3.530 08/05/2017: B Natriuretic Peptide 627.5 09/18/2017: Magnesium 2.7 09/29/2017: ALT 68; BUN 17; Creatinine, Ser 1.16; Hemoglobin 8.7; Platelets 336; Potassium 4.4; Sodium 140   Lipid Panel No results found for: CHOL, TRIG, HDL, CHOLHDL, VLDL, LDLCALC, LDLDIRECT  Additional studies/ records that were reviewed today include:   Echo 09/21/2017 LV EF: 55% -   60% Study Conclusions  -  Left ventricle: The cavity size was normal. There was mild   concentric hypertrophy. Systolic function was normal. The   estimated ejection fraction was in the range of 55% to 60%. Wall   motion was normal; there were no regional wall motion   abnormalities. - Ventricular septum: Septal motion showed paradox. - Aortic valve: A bioprosthesis was present and functioning   normally. Valve area (VTI): 1.9 cm^2. Valve area (Vmax): 1.91   cm^2. Valve area (Vmean): 2.17 cm^2. - Pericardium, extracardiac: A small pericardial effusion was   identified posterior to the heart. The fluid had no internal   echoes.There was no evidence of hemodynamic compromise.  Impressions:  - The inferior vena cava is plethoric, but there are no other signs   of pericardial tampondae. Consider serial studies. Perivalvular   aortic prosthesis insufficiency is no longer seen.    ASSESSMENT:    1. S/P AVR   2. ST elevation myocardial infarction (STEMI) of inferolateral wall (HCC)   3. Anemia, unspecified type   4. Acute renal insufficiency      PLAN:  In order of problems listed above:  1. Status post AVR: Unfortunately he had to undergo redo AVR after the recent minimally placed AVR had a significant paravalvular.  Postprocedure, repeat echocardiogram showed a well-seated aortic valve without significant leakage.  Although he did have inferior ST elevation post surgery, this has resolved on today's EKG.  It was felt to be related to possible pericarditis.  Blood pressure mildly elevated, will increase metoprolol to 50 mg twice daily.  2. Anemia: Anemia is improving, previous anemia felt to be related to paravalvular leak  3. Abnormal EKG: post op, patient had ST elevation in inferior leads felt to be related to pericarditis. He has not chest pain or SOB.   4. AKI: He was placed on Lasix for post surgery volume overload, he has finished a short course of Lasix and potassium supplement.  He is euvolemic at  this point.  Recent creatinine went up to 1.16 from 0.77.  He no longer needs any diuretic at this time.   Medication Adjustments/Labs and Tests Ordered: Current medicines are reviewed at length with the patient today.  Concerns regarding medicines are  outlined above.  Medication changes, Labs and Tests ordered today are listed in the Patient Instructions below. Patient Instructions  Medication Instructions:  Your physician has recommended you make the following change in your medication:  1) INCREASE Lopressor to 50 mg tablet by mouth TWICE daily   Labwork: none  Testing/Procedures: none  Follow-Up: Your physician recommends that you schedule a follow-up appointment in: 2-3 months with Dr. Rennis Golden.   Any Other Special Instructions Will Be Listed Below (If Applicable).     If you need a refill on your cardiac medications before your next appointment, please call your pharmacy.      Ramond Dial, [place]  10/01/2017 12:22 PM    [facility] [address] [place], [place], [place]  [address] Phone: [phone]; Fax: [phone]

## 2017-09-30 NOTE — Progress Notes (Unsigned)
I called Luis Ferguson and gave him the results of his lab work yesterday.  He continues to have an excellent reticulocyte count.  The LDH is coming down and the haptoglobin is coming up.  As soon as his hemoglobin hits 10 I am going to start dropping his prednisone

## 2017-10-01 ENCOUNTER — Encounter: Payer: Self-pay | Admitting: Physician Assistant

## 2017-10-05 ENCOUNTER — Telehealth (HOSPITAL_COMMUNITY): Payer: Self-pay

## 2017-10-05 ENCOUNTER — Telehealth: Payer: Self-pay | Admitting: Physician Assistant

## 2017-10-05 MED ORDER — METOPROLOL TARTRATE 50 MG PO TABS: 50.0000 mg | ORAL_TABLET | Freq: Two times a day (BID) | ORAL | 3 refills | Status: DC

## 2017-10-05 NOTE — Telephone Encounter (Signed)
Patient called to confirm metoprolol tartrate dose and directions. Patient advised that his new directions state that he should take 50 mg by mouth twice daily. Patient requested a new prescription be sent to his mail order pharmacy ([facility]). Patient advised that a new rx would be sent.

## 2017-10-05 NOTE — Telephone Encounter (Signed)
Follow Up:    Please call, question about his Metoprolol. Question about his prescription refiil he received for his Metoprolol.

## 2017-10-05 NOTE — Telephone Encounter (Signed)
Called to speak with patient in regards to Cardiac Rehab - Patient stated he is not interested in the program as he is currently exercising on his own at home. Closed referral.

## 2017-10-06 ENCOUNTER — Inpatient Hospital Stay: Payer: Commercial Managed Care - PPO

## 2017-10-06 DIAGNOSIS — D594 Other nonautoimmune hemolytic anemias: Secondary | ICD-10-CM

## 2017-10-06 DIAGNOSIS — R059 Cough, unspecified: Secondary | ICD-10-CM

## 2017-10-06 DIAGNOSIS — D590 Drug-induced autoimmune hemolytic anemia: Secondary | ICD-10-CM

## 2017-10-06 DIAGNOSIS — D591 Other autoimmune hemolytic anemias: Secondary | ICD-10-CM | POA: Diagnosis not present

## 2017-10-06 DIAGNOSIS — E785 Hyperlipidemia, unspecified: Secondary | ICD-10-CM

## 2017-10-06 DIAGNOSIS — D649 Anemia, unspecified: Secondary | ICD-10-CM

## 2017-10-06 DIAGNOSIS — Z952 Presence of prosthetic heart valve: Secondary | ICD-10-CM

## 2017-10-06 DIAGNOSIS — L930 Discoid lupus erythematosus: Secondary | ICD-10-CM

## 2017-10-06 DIAGNOSIS — Q2381 Bicuspid aortic valve: Secondary | ICD-10-CM

## 2017-10-06 DIAGNOSIS — K2100 Gastro-esophageal reflux disease with esophagitis, without bleeding: Secondary | ICD-10-CM

## 2017-10-06 DIAGNOSIS — I350 Nonrheumatic aortic (valve) stenosis: Secondary | ICD-10-CM

## 2017-10-06 DIAGNOSIS — D500 Iron deficiency anemia secondary to blood loss (chronic): Secondary | ICD-10-CM

## 2017-10-06 DIAGNOSIS — I219 Acute myocardial infarction, unspecified: Secondary | ICD-10-CM | POA: Diagnosis not present

## 2017-10-06 DIAGNOSIS — R05 Cough: Secondary | ICD-10-CM

## 2017-10-06 DIAGNOSIS — D696 Thrombocytopenia, unspecified: Secondary | ICD-10-CM | POA: Diagnosis not present

## 2017-10-06 DIAGNOSIS — Q231 Congenital insufficiency of aortic valve: Secondary | ICD-10-CM

## 2017-10-06 LAB — COMPREHENSIVE METABOLIC PANEL
ALBUMIN: 3.7 g/dL (ref 3.5–5.0)
ALT: 50 U/L (ref 0–55)
ANION GAP: 9 (ref 3–11)
AST: 19 U/L (ref 5–34)
Alkaline Phosphatase: 78 U/L (ref 40–150)
BUN: 13 mg/dL (ref 7–26)
CALCIUM: 9.5 mg/dL (ref 8.4–10.4)
CHLORIDE: 104 mmol/L (ref 98–109)
CO2: 26 mmol/L (ref 22–29)
Creatinine, Ser: 0.75 mg/dL (ref 0.70–1.30)
GFR calc non Af Amer: >60 mL/min (ref >60)
Glucose, Bld: 118 mg/dL (ref 70–140)
POTASSIUM: 4.5 mmol/L (ref 3.5–5.1)
SODIUM: 139 mmol/L (ref 136–145)
Total Bilirubin: 0.4 mg/dL (ref 0.2–1.2)
Total Protein: 6.9 g/dL (ref 6.4–8.3)

## 2017-10-06 LAB — CBC WITH DIFFERENTIAL/PLATELET
BASOS PCT: 0 %
Basophils Absolute: 0 10*3/uL (ref 0.0–0.1)
Eosinophils Absolute: 0 10*3/uL (ref 0.0–0.5)
Eosinophils Relative: 0 %
HEMATOCRIT: 34 % — AB (ref 38.4–49.9)
HEMOGLOBIN: 10.2 g/dL — AB (ref 13.0–17.1)
LYMPHS ABS: 1.3 10*3/uL (ref 0.9–3.3)
LYMPHS PCT: 18 %
MCH: 29.1 pg (ref 27.2–33.4)
MCHC: 30 g/dL — ABNORMAL LOW (ref 32.0–36.0)
MCV: 97.1 fL (ref 79.3–98.0)
Monocytes Absolute: 0.4 10*3/uL (ref 0.1–0.9)
Monocytes Relative: 6 %
NEUTROS ABS: 5.6 10*3/uL (ref 1.5–6.5)
NEUTROS PCT: 76 %
Platelets: 330 10*3/uL (ref 140–400)
RBC: 3.5 MIL/uL — ABNORMAL LOW (ref 4.20–5.82)
RDW: 16.5 % — ABNORMAL HIGH (ref 11.0–14.6)
WBC: 7.4 10*3/uL (ref 4.0–10.3)

## 2017-10-06 LAB — RETICULOCYTES
RBC.: 3.5 MIL/uL — ABNORMAL LOW (ref 4.20–5.82)
RETIC CT PCT: 4.9 % — AB (ref 0.8–1.8)
Retic Count, Absolute: 171.5 10*3/uL — ABNORMAL HIGH (ref 34.8–93.9)

## 2017-10-06 LAB — LACTATE DEHYDROGENASE: LDH: 402 U/L — ABNORMAL HIGH (ref 125–245)

## 2017-10-07 LAB — HAPTOGLOBIN: Haptoglobin: 103 mg/dL (ref 34–200)

## 2017-10-13 ENCOUNTER — Inpatient Hospital Stay: Payer: Commercial Managed Care - PPO

## 2017-10-13 DIAGNOSIS — D590 Drug-induced autoimmune hemolytic anemia: Secondary | ICD-10-CM

## 2017-10-13 DIAGNOSIS — E785 Hyperlipidemia, unspecified: Secondary | ICD-10-CM

## 2017-10-13 DIAGNOSIS — D696 Thrombocytopenia, unspecified: Secondary | ICD-10-CM | POA: Diagnosis not present

## 2017-10-13 DIAGNOSIS — D591 Other autoimmune hemolytic anemias: Secondary | ICD-10-CM | POA: Diagnosis not present

## 2017-10-13 DIAGNOSIS — I350 Nonrheumatic aortic (valve) stenosis: Secondary | ICD-10-CM

## 2017-10-13 DIAGNOSIS — R059 Cough, unspecified: Secondary | ICD-10-CM

## 2017-10-13 DIAGNOSIS — Z952 Presence of prosthetic heart valve: Secondary | ICD-10-CM

## 2017-10-13 DIAGNOSIS — I219 Acute myocardial infarction, unspecified: Secondary | ICD-10-CM | POA: Diagnosis not present

## 2017-10-13 DIAGNOSIS — K2100 Gastro-esophageal reflux disease with esophagitis, without bleeding: Secondary | ICD-10-CM

## 2017-10-13 DIAGNOSIS — Q231 Congenital insufficiency of aortic valve: Secondary | ICD-10-CM

## 2017-10-13 DIAGNOSIS — R05 Cough: Secondary | ICD-10-CM

## 2017-10-13 DIAGNOSIS — D594 Other nonautoimmune hemolytic anemias: Secondary | ICD-10-CM

## 2017-10-13 DIAGNOSIS — L930 Discoid lupus erythematosus: Secondary | ICD-10-CM

## 2017-10-13 DIAGNOSIS — D500 Iron deficiency anemia secondary to blood loss (chronic): Secondary | ICD-10-CM

## 2017-10-13 DIAGNOSIS — D649 Anemia, unspecified: Secondary | ICD-10-CM

## 2017-10-13 LAB — CBC WITH DIFFERENTIAL/PLATELET
BASOS ABS: 0.1 10*3/uL (ref 0.0–0.1)
Basophils Relative: 2 %
EOS PCT: 2 %
Eosinophils Absolute: 0.2 10*3/uL (ref 0.0–0.5)
HEMATOCRIT: 35.1 % — AB (ref 38.4–49.9)
Hemoglobin: 11.1 g/dL — ABNORMAL LOW (ref 13.0–17.1)
LYMPHS ABS: 1.5 10*3/uL (ref 0.9–3.3)
LYMPHS PCT: 24 %
MCH: 29 pg (ref 27.2–33.4)
MCHC: 31.7 g/dL — ABNORMAL LOW (ref 32.0–36.0)
MCV: 91.5 fL (ref 79.3–98.0)
MONO ABS: 0.5 10*3/uL (ref 0.1–0.9)
MONOS PCT: 8 %
NEUTROS ABS: 4.1 10*3/uL (ref 1.5–6.5)
Neutrophils Relative %: 64 %
PLATELETS: 279 10*3/uL (ref 140–400)
RBC: 3.83 MIL/uL — ABNORMAL LOW (ref 4.20–5.82)
RDW: 18.1 % — AB (ref 11.0–14.6)
WBC: 6.4 10*3/uL (ref 4.0–10.3)

## 2017-10-13 LAB — COMPREHENSIVE METABOLIC PANEL
ALBUMIN: 3.5 g/dL (ref 3.5–5.0)
ALT: 51 U/L (ref 0–55)
ANION GAP: 8 (ref 3–11)
AST: 23 U/L (ref 5–34)
Alkaline Phosphatase: 69 U/L (ref 40–150)
BILIRUBIN TOTAL: 0.3 mg/dL (ref 0.2–1.2)
BUN: 14 mg/dL (ref 7–26)
CHLORIDE: 105 mmol/L (ref 98–109)
CO2: 28 mmol/L (ref 22–29)
Calcium: 9.1 mg/dL (ref 8.4–10.4)
Creatinine, Ser: 0.79 mg/dL (ref 0.70–1.30)
GFR calc Af Amer: >60 mL/min (ref >60)
GFR calc non Af Amer: >60 mL/min (ref >60)
GLUCOSE: 97 mg/dL (ref 70–140)
POTASSIUM: 3.5 mmol/L (ref 3.5–5.1)
SODIUM: 141 mmol/L (ref 136–145)
Total Protein: 6.7 g/dL (ref 6.4–8.3)

## 2017-10-13 LAB — RETICULOCYTES
RBC.: 3.85 MIL/uL — ABNORMAL LOW (ref 4.20–5.82)
RETIC COUNT ABSOLUTE: 88.6 10*3/uL (ref 34.8–93.9)
Retic Ct Pct: 2.3 % — ABNORMAL HIGH (ref 0.8–1.8)

## 2017-10-13 LAB — LACTATE DEHYDROGENASE: LDH: 279 U/L — ABNORMAL HIGH (ref 125–245)

## 2017-10-14 DIAGNOSIS — Z79899 Other long term (current) drug therapy: Secondary | ICD-10-CM | POA: Diagnosis not present

## 2017-10-14 DIAGNOSIS — H40013 Open angle with borderline findings, low risk, bilateral: Secondary | ICD-10-CM | POA: Diagnosis not present

## 2017-10-14 DIAGNOSIS — H353131 Nonexudative age-related macular degeneration, bilateral, early dry stage: Secondary | ICD-10-CM | POA: Diagnosis not present

## 2017-10-14 LAB — HAPTOGLOBIN: HAPTOGLOBIN: 155 mg/dL (ref 34–200)

## 2017-10-18 ENCOUNTER — Other Ambulatory Visit: Payer: Self-pay | Admitting: Thoracic Surgery (Cardiothoracic Vascular Surgery)

## 2017-10-18 DIAGNOSIS — Z952 Presence of prosthetic heart valve: Secondary | ICD-10-CM

## 2017-10-19 ENCOUNTER — Ambulatory Visit (INDEPENDENT_AMBULATORY_CARE_PROVIDER_SITE_OTHER): Payer: Self-pay | Admitting: Thoracic Surgery (Cardiothoracic Vascular Surgery)

## 2017-10-19 ENCOUNTER — Ambulatory Visit
Admission: RE | Admit: 2017-10-19 | Discharge: 2017-10-19 | Disposition: A | Discharge status: Home / Self Care | Payer: Commercial Managed Care - PPO | Source: Ambulatory Visit | Attending: Thoracic Surgery (Cardiothoracic Vascular Surgery) | Admitting: Thoracic Surgery (Cardiothoracic Vascular Surgery)

## 2017-10-19 ENCOUNTER — Other Ambulatory Visit: Payer: Self-pay

## 2017-10-19 ENCOUNTER — Encounter: Payer: Self-pay | Admitting: Thoracic Surgery (Cardiothoracic Vascular Surgery)

## 2017-10-19 VITALS — BP 118/83 | HR 81 | Resp 16 | Ht 66.0 in | Wt 162.0 lb

## 2017-10-19 DIAGNOSIS — I712 Thoracic aortic aneurysm, without rupture, unspecified: Secondary | ICD-10-CM

## 2017-10-19 DIAGNOSIS — I350 Nonrheumatic aortic (valve) stenosis: Secondary | ICD-10-CM

## 2017-10-19 DIAGNOSIS — Z954 Presence of other heart-valve replacement: Secondary | ICD-10-CM | POA: Diagnosis not present

## 2017-10-19 DIAGNOSIS — Z952 Presence of prosthetic heart valve: Secondary | ICD-10-CM

## 2017-10-19 DIAGNOSIS — T8203XD Leakage of heart valve prosthesis, subsequent encounter: Secondary | ICD-10-CM

## 2017-10-19 NOTE — Progress Notes (Signed)
[facility]  [phone] [phone]     ID: Luis Ferguson DOB: 05-29-1961  MR#: [id number]  JYN#:[id number]  Patient Care Team: Luis Ferguson as PCP - General (Nurse Practitioner) Luis Nose, MD as PCP - Cardiology (Cardiology) Luis Muskrat, MD (Rheumatology) Luis Slot, MD as Consulting Physician (Cardiothoracic Surgery) Luis Ferguson OTHER MD:  CHIEF COMPLAINT: Hemolytic anemia  CURRENT TREATMENT: Prednisone taper  INTERVAL HISTORY: Luis Ferguson is here today for follow up of his anemia. He continues on plaquenil and prednisone. He tolerates this medication well. His arthritis has improved. He had a psoriasis flare-up. He was using a creme for this, and we plans to see the dermatologist on 11/19/2017 for a refill. He says that his energy level is good.      REVIEW OF SYSTEMS: Luis Ferguson reports that he is doing fine. He visited his surgeon yesterday since having an aortic valve replacement.  He will begin work this week for a light load, and he will work hours more next week. He denies unusual headaches, visual changes, nausea, vomiting, or dizziness. There has been no unusual cough, Ferguson production, or pleurisy. This been no change in bowel or bladder habits. He denies unexplained fatigue or unexplained weight loss, bleeding, rash, or fever. A detailed review of systems was otherwise stable.   HISTORY OF CURRENT ILLNESS: Luis Ferguson has a history of lupus which is managed I believe by Dr. Corliss Ferguson (the patient was not certain of the name today).  He has been on Plaquenil and tells me his lupus has been very stable.  On 07/12/2017 he underwent valve replacement for severe aortic stenosis.  This was an EIE valve, which is a tissue valve.  He did generally well with the surgery and note that his hemoglobin preop was 12.5 (on 07/09/2017).  At the time of discharge from that procedure his hemoglobin remained low and on 07/19/2017 he was  transfused 2 units of packed red cells.  He then followed up with cardiology on 07/30/2017 and at that time his hemoglobin was down to 6.2.  White cell count was 5.2 and platelets 267,000.  The MCV was essentially unchanged at 86.  The patient was eventually alerted to these results and asked to present for admission.  This happened yesterday, 08/05/2017, and admission workup included, in addition to the anemia findings, an LDH of 1647, a reticulocyte count of greater than 200, and normal creatinine, INR, and a PTT.  B12, folate, and ferritin were normal. The DIC panel did show schistocytes.  DAT was negative for IgG and complement.  Haptoglobin was <10  [name] subsequent history is as detailed below.    PAST MEDICAL HISTORY: Past Medical History:  Diagnosis Date  . Anemia   . Aortic stenosis   . Arthritis    achiness in shoulder , elbows  . Connective tissue disease (HCC)    followed by Dr. Kathi Ferguson, GMA, pt. off Prednisone since mid 2017  . Fatigue   . Headache   . Perivalvular leak of prosthetic heart valve   . PONV (postoperative nausea and vomiting)   . Psoriasis    below the knee on both legs   . Wears glasses     PAST SURGICAL HISTORY: Past Surgical History:  Procedure Laterality Date  . AORTIC VALVE REPLACEMENT  06/2017  . AORTIC VALVE REPLACEMENT N/A 09/17/2017   Procedure: REDO AORTIC VALVE REPLACEMENT (AVR);  Surgeon: Luis Slot, MD;  Location: [facility] OR;  Service: Open  Heart Surgery;  Laterality: N/A;  . CARDIAC CATHETERIZATION    . HERNIA REPAIR  2016   left and right  . RIGHT/LEFT HEART CATH AND CORONARY ANGIOGRAPHY N/A 06/16/2017   Procedure: RIGHT/LEFT HEART CATH AND CORONARY ANGIOGRAPHY;  Surgeon: Luis Records, MD;  Location: [facility] INVASIVE CV LAB;  Service: Cardiovascular;  Laterality: N/A;  . TEE WITHOUT CARDIOVERSION N/A 07/12/2017   Procedure: TRANSESOPHAGEAL ECHOCARDIOGRAM (TEE);  Surgeon: Luis Slot, MD;  Location: [facility] OR;  Service: Open  Heart Surgery;  Laterality: N/A;  . TEE WITHOUT CARDIOVERSION N/A 09/09/2017   Procedure: TRANSESOPHAGEAL ECHOCARDIOGRAM (TEE);  Surgeon: Luis Fair, MD;  Location: [facility] ENDOSCOPY;  Service: Cardiovascular;  Laterality: N/A;  . TEE WITHOUT CARDIOVERSION N/A 09/17/2017   Procedure: TRANSESOPHAGEAL ECHOCARDIOGRAM (TEE);  Surgeon: Luis Slot, MD;  Location: [facility] OR;  Service: Open Heart Surgery;  Laterality: N/A;    FAMILY HISTORY Family History  Problem Relation Age of Onset  . Diabetes Mother        also HTN  . CAD Father 5       CABGx3  . Dementia Father   . Liver cancer Brother   . Diabetes Brother    SOCIAL HISTORY:  He works for [employer], cutting metal.  He denies any significant exposure at work.  His wife Luis Ferguson works as an Airline pilot.  They have no children.  They do have 2 dogs.  They belong to a local Luis Ferguson in church          ADVANCED DIRECTIVES:    HEALTH MAINTENANCE: Social History   Tobacco Use  . Smoking status: Never Smoker  . Smokeless tobacco: Never Used  Substance Use Topics  . Alcohol use: Yes    Comment: rare  . Drug use: No     Colonoscopy:  PSA:  Bone density:   Allergies  Allergen Reactions  . Ace Inhibitors Cough  . Codeine Itching and Rash    Current Outpatient Medications  Medication Sig Dispense Refill  . aspirin EC 325 MG EC tablet Take 1 tablet (325 mg total) by mouth daily. 30 tablet 0  . atorvastatin (LIPITOR) 20 MG tablet Take 20 mg by mouth daily.     . calcipotriene (DOVONOX) 0.005 % cream Apply 1 application topically 2 (two) times daily as needed (psoriasis).     . cholecalciferol (VITAMIN D) 1000 UNITS tablet Take 1,000 Units by mouth daily.    . clobetasol ointment (TEMOVATE) 0.05 % Apply 1 application topically 2 (two) times daily as needed (psoriasis).     . hydroxychloroquine (PLAQUENIL) 200 MG tablet Take 400 mg by mouth daily.     . metoprolol tartrate (LOPRESSOR) 50 MG tablet Take 1 tablet (50  mg total) by mouth 2 (two) times daily. 180 tablet 3  . Omega-3 Fatty Acids (FISH OIL PO) Take 360 mg by mouth daily.    . predniSONE (DELTASONE) 20 MG tablet Take 1 tablet (20 mg total) by mouth daily with breakfast. (Patient taking differently: Take 15 mg by mouth daily with breakfast. ) 30 tablet 0  . tamsulosin (FLOMAX) 0.4 MG CAPS capsule Take 0.4 mg by mouth daily after breakfast.      No current facility-administered medications for this visit.     OBJECTIVE: Middle-aged white man in no acute distress  Vitals:   10/20/17 1601  BP: (!) 142/92  Pulse: 72  Resp: 18  Temp: 98.7 F (37.1 C)  SpO2: 100%     Body mass index is  27.31 kg/m.   Wt Readings from Last 3 Encounters:  10/20/17 169 lb 3.2 oz (76.7 kg)  10/19/17 162 lb (73.5 kg)  09/30/17 163 lb (73.9 kg)      ECOG FS:1 - Symptomatic but completely ambulatory  Mild moon facies Sclerae unicteric, pupils round and equal No cervical or supraclavicular adenopathy Lungs no rales or rhonchi Heart regular rate and rhythm, 3/6 systolic murmur Abd soft, nontender, positive bowel sounds MSK no focal spinal tenderness, some redness in the distal interphalangeal joints bilaterally Neuro: nonfocal, well oriented, appropriate affect Skin: Rash over both lower legs consistent with psoriasis  LAB RESULTS:  CMP     Component Value Date/Time   NA 141 10/13/2017 0950   NA 142 06/15/2017 1603   K 3.5 10/13/2017 0950   CL 105 10/13/2017 0950   CO2 28 10/13/2017 0950   GLUCOSE 97 10/13/2017 0950   BUN 14 10/13/2017 0950   BUN 12 06/15/2017 1603   CREATININE 0.79 10/13/2017 0950   CALCIUM 9.1 10/13/2017 0950   PROT 6.7 10/13/2017 0950   ALBUMIN 3.5 10/13/2017 0950   AST 23 10/13/2017 0950   ALT 51 10/13/2017 0950   ALKPHOS 69 10/13/2017 0950   BILITOT 0.3 10/13/2017 0950   GFRNONAA >60 10/13/2017 0950   GFRAA >60 10/13/2017 0950    No results found for: Dorene Ar, A1GS, A2GS, BETS, BETA2SER, GAMS, MSPIKE,  SPEI  No results found for: Luis Ferguson Parker, [facility]  Lab Results  Component Value Date   WBC 6.1 10/20/2017   NEUTROABS 4.7 10/20/2017   HGB 11.7 (L) 10/20/2017   HCT 38.6 10/20/2017   MCV 92.8 10/20/2017   PLT 239 10/20/2017      Chemistry      Component Value Date/Time   NA 141 10/13/2017 0950   NA 142 06/15/2017 1603   K 3.5 10/13/2017 0950   CL 105 10/13/2017 0950   CO2 28 10/13/2017 0950   BUN 14 10/13/2017 0950   BUN 12 06/15/2017 1603   CREATININE 0.79 10/13/2017 0950      Component Value Date/Time   CALCIUM 9.1 10/13/2017 0950   ALKPHOS 69 10/13/2017 0950   AST 23 10/13/2017 0950   ALT 51 10/13/2017 0950   BILITOT 0.3 10/13/2017 0950       No results found for: LABCA2  No components found for: WUJWJX914  No results for input(s): INR in the last 168 hours.  No results found for: LABCA2  No results found for: NWG956  No results found for: OZH086  No results found for: VHQ469  No results found for: CA2729  No components found for: HGQUANT  No results found for: CEA1 / No results found for: CEA1   No results found for: AFPTUMOR  No results found for: CHROMOGRNA  No results found for: PSA1  Appointment on 10/20/2017  Component Date Value Ref Range Status  . Retic Ct Pct 10/20/2017 1.3  0.8 - 1.8 % Final  . RBC. 10/20/2017 4.16* 4.20 - 5.82 MIL/uL Final  . Retic Count, Absolute 10/20/2017 54.1  34.8 - 93.9 K/uL Final   Performed at [facility], [address] [place], [place] [address]  . WBC 10/20/2017 6.1  4.0 - 10.3 K/uL Final  . RBC 10/20/2017 4.16* 4.20 - 5.82 MIL/uL Final  . Hemoglobin 10/20/2017 11.7* 13.0 - 17.1 g/dL Final  . HCT 84/13/2440 38.6  38.4 - 49.9 % Final  . MCV 10/20/2017 92.8  79.3 - 98.0 fL Final  .  MCH 10/20/2017 28.1  27.2 - 33.4 pg Final  . MCHC 10/20/2017 30.3* 32.0 - 36.0 g/dL Final  . RDW 16/04/9603 15.6* 11.0 - 14.6 % Final  . Platelets 10/20/2017 239  140 - 400 K/uL  Final  . Neutrophils Relative % 10/20/2017 77  % Final  . Neutro Abs 10/20/2017 4.7  1.5 - 6.5 K/uL Final  . Lymphocytes Relative 10/20/2017 15  % Final  . Lymphs Abs 10/20/2017 0.9  0.9 - 3.3 K/uL Final  . Monocytes Relative 10/20/2017 7  % Final  . Monocytes Absolute 10/20/2017 0.4  0.1 - 0.9 K/uL Final  . Eosinophils Relative 10/20/2017 1  % Final  . Eosinophils Absolute 10/20/2017 0.0  0.0 - 0.5 K/uL Final  . Basophils Relative 10/20/2017 0  % Final  . Basophils Absolute 10/20/2017 0.0  0.0 - 0.1 K/uL Final   Performed at [facility], [address] [place], [place] [address]    (this displays the last labs from the last 3 days)  No results found for: TOTALPROTELP, ALBUMINELP, A1GS, A2GS, BETS, BETA2SER, GAMS, MSPIKE, SPEI (this displays SPEP labs)  No results found for: KPAFRELGTCHN, LAMBDASER, KAPLAMBRATIO (kappa/lambda light chains)  No results found for: HGBA, HGBA2QUANT, HGBFQUANT, HGBSQUAN (Hemoglobinopathy evaluation)   Lab Results  Component Value Date   LDH 279 (H) 10/13/2017    Lab Results  Component Value Date   IRON 53 08/06/2017   TIBC 190 (L) 08/06/2017   IRONPCTSAT 28 08/06/2017   (Iron and TIBC)  Lab Results  Component Value Date   FERRITIN 374 (H) 08/06/2017    Urinalysis    Component Value Date/Time   COLORURINE YELLOW 09/16/2017 0901   APPEARANCEUR CLEAR 09/16/2017 0901   LABSPEC 1.012 09/16/2017 0901   PHURINE 6.0 09/16/2017 0901   GLUCOSEU NEGATIVE 09/16/2017 0901   HGBUR LARGE (A) 09/16/2017 0901   BILIRUBINUR NEGATIVE 09/16/2017 0901   KETONESUR NEGATIVE 09/16/2017 0901   PROTEINUR 30 (A) 09/16/2017 0901   NITRITE NEGATIVE 09/16/2017 0901   LEUKOCYTESUR NEGATIVE 09/16/2017 0901     STUDIES: Dg Chest 2 View  Result Date: 10/19/2017 CLINICAL DATA:  Aortic valve replacement. EXAM: CHEST - 2 VIEW COMPARISON:  09/19/2017. FINDINGS: Interim removal of right IJ sheath and mediastinal drainage catheter. Prior  cardiac valve replacement. Heart size normal. No pulmonary venous congestion. No acute pulmonary disease. No significant pleural effusion. No pneumothorax. IMPRESSION: 1. Interim removal of right IJ sheath and mediastinal drainage catheter. Prior cardiac valve replacement. Heart size normal. 2.  No acute pulmonary disease. Electronically Signed   By: Maisie Fus  Register   On: 10/19/2017 16:23    ELIGIBLE FOR AVAILABLE RESEARCH PROTOCOL: no  ASSESSMENT: 57 y.o. 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   (1) history of lupus, on plaquenil  (a) Dr Luis Ferguson tells me patient was tested for glucose-6-phosphate dehydrogenase deficiency through her office, with negative results  (2) s/p aortic valve replacement 07/12/2017, tissue valve     (a) echo 08/06/2017 showed mild perivalvular regurgitation (noted at time of surgery)--poor valve visualization   (b) redo AVR 09/17/2017  (  3) s/p RBC transfusion 07/19/2017 and prior, repeat 08/13/2017  (4) prednisone 60 mg/day started 08/06/2017--with ongoing taper  PLAN: Luis Ferguson's hemolysis has resolved with repeat aVR and his hemoglobin has steadily risen.  His LDH and haptoglobin are normal.  His reticulocyte count has diminished appropriately.  He is now just about not anemic but of course he does have other problems and is on Plaquenil which might keep him from achieving a hemoglobin of 13.  Certainly at this point he is asymptomatic as far as his hemolytic anemia is concerned.  I am dropping his prednisone now to 10 mg daily.  If he does well with that he can drop it to 5 mg daily beginning in 2 weeks  and then after 3 weeks of that he can stop.  I have asked him to call me if he becomes very fatigued.  He is planning to get back to work this week.  I will check his lab work one more time in about 3 months.  I expect to discharge him from follow-up at that time    Lowella Dell, MD  10/20/17 4:17 PM Medical Oncology and Hematology [facility] [address] [place], [place] [address] Tel. [phone]    Fax. [phone]  This document serves as a record of services personally performed by Ruthann Cancer, MD. It was created on his behalf by Merideth Abbey, a trained medical scribe. The creation of this record is based on the scribe's personal observations and the provider's statements to them.   I have reviewed the above documentation for accuracy and completeness, and I agree with the above.

## 2017-10-19 NOTE — Progress Notes (Signed)
[address]       Luis [place] [address]             [phone]       HPI: [name] returns for follow-up after his aortic valve replacement  Luis Ferguson is a 57 year old gentleman with a history of lupus, aortic stenosis and insufficiency, and dyslipidemia.  He presented with complaints of decreased energy and fatigue, peripheral edema, and dizziness with exertion. He underwent AvR with an Edwards Intuity valve via a right mini-thoracotomy on 07/12/2017. Unfortunately, he developed a severe perivalvular leak with hemolytic anemia. He then underwent a redo AVR via a median sternotomy on 09/17/2017.  His postoperative course was uncomplicated and he went home on day 4.  He feels well.  He says he feels better than he did prior to his original surgery.  He has been walking without any chest pain or shortness of breath.  His exercise tolerance is improving.  He is not taking any medication for pain.  Past Medical History:  Diagnosis Date  . Anemia   . Aortic stenosis   . Arthritis    achiness in shoulder , elbows  . Connective tissue disease (HCC)    followed by Dr. Kathi Ferguson, GMA, pt. off Prednisone since mid 2017  . Fatigue   . Headache   . Perivalvular leak of prosthetic heart valve   . PONV (postoperative nausea and vomiting)   . Psoriasis    below the knee on both legs   . Wears glasses      Current Outpatient Medications  Medication Sig Dispense Refill  . aspirin EC 325 MG EC tablet Take 1 tablet (325 mg total) by mouth daily. 30 tablet 0  . atorvastatin (LIPITOR) 20 MG tablet Take 20 mg by mouth daily.     . calcipotriene (DOVONOX) 0.005 % cream Apply 1 application topically 2 (two) times daily as needed (psoriasis).     . cholecalciferol (VITAMIN D) 1000 UNITS tablet Take 1,000 Units by mouth daily.    . clobetasol ointment (TEMOVATE) 0.05 % Apply 1 application topically 2 (two) times daily as needed (psoriasis).     . hydroxychloroquine (PLAQUENIL)  200 MG tablet Take 400 mg by mouth daily.     . metoprolol tartrate (LOPRESSOR) 50 MG tablet Take 1 tablet (50 mg total) by mouth 2 (two) times daily. 180 tablet 3  . Omega-3 Fatty Acids (FISH OIL PO) Take 360 mg by mouth daily.    . predniSONE (DELTASONE) 20 MG tablet Take 1 tablet (20 mg total) by mouth daily with breakfast. (Patient taking differently: Take 15 mg by mouth daily with breakfast. ) 30 tablet 0  . tamsulosin (FLOMAX) 0.4 MG CAPS capsule Take 0.4 mg by mouth daily after breakfast.      No current facility-administered medications for this visit.     Physical Exam BP 118/83 (BP Location: Right Arm, Patient Position: Sitting, Cuff Size: Normal)   Pulse 81   Resp 16   Ht 5\' 6"  (1.676 m)   Wt 162 lb (73.5 kg)   SpO2 96% Comment: RA  BMI 26.79 kg/m  57 year old man in no acute distress Alert and oriented x3 with no focal motor deficit + Resting tremor left hand Cardiac regular rate and rhythm with a 2/6 systolic murmur Lungs clear with equal breath sounds bilaterally Incisions clean dry and intact, sternum stable No peripheral edema  Diagnostic Tests: CHEST - 2 VIEW  COMPARISON:  09/19/2017.  FINDINGS:  Interim removal of right IJ sheath and mediastinal drainage catheter. Prior cardiac valve replacement. Heart size normal. No pulmonary venous congestion. No acute pulmonary disease. No significant pleural effusion. No pneumothorax.  IMPRESSION: 1. Interim removal of right IJ sheath and mediastinal drainage catheter. Prior cardiac valve replacement. Heart size normal.  2.  No acute pulmonary disease.   Electronically Signed   By: Luis Ferguson Fus  Luis Ferguson   On: 10/19/2017 16:23 I personally reviewed the chest x-ray images and concur with the findings noted above  Impression: Luis Ferguson is a 57 yo man with a history of lupus, dyslipidemia and aortic stenosis and insufficiency. He underwent AvR with an Edwards Intuity valve via a right mini-thoracotomy on  07/12/2017. Unfortunately developed a severe perivalvular leak with hemolytic anemia. He then underwent a redo AVR via a median sternotomy on 09/17/2017.  Postoperative course was uncomplicated and he went home on postoperative day #4.  Since he had a tissue valve he was not anticoagulated.  He is doing well at this time.  He is very anxious to increase his activities.  Back to work on Thursday.  His wife says he has been hard to keep from doing too much.  I think he is okay to return to work on light duty on Thursday.  He should not engage in any heavy physical activity at work.  He is not to lift anything over 10 pounds for another week.  After that he should not lift anything over 20 pounds for another 2 weeks.  Beyond that his activities are unrestricted but he should build into them gradually.  He may drive, appropriate cautions were discussed.  Anemia-most recent hematocrit is 35.1.  Plan:  Follow-up as scheduled with Dr. Rennis Ferguson.  I will be happy to see him back at anytime in the future if I can be of any further assistance with his care.  Luis Slot, MD [facility] [phone]

## 2017-10-20 ENCOUNTER — Telehealth: Payer: Self-pay | Admitting: Oncology

## 2017-10-20 ENCOUNTER — Inpatient Hospital Stay: Payer: Commercial Managed Care - PPO

## 2017-10-20 ENCOUNTER — Inpatient Hospital Stay (HOSPITAL_BASED_OUTPATIENT_CLINIC_OR_DEPARTMENT_OTHER): Payer: Commercial Managed Care - PPO | Admitting: Oncology

## 2017-10-20 VITALS — BP 142/92 | HR 72 | Temp 98.7°F | Resp 18 | Ht 66.0 in | Wt 169.2 lb

## 2017-10-20 DIAGNOSIS — Y839 Surgical procedure, unspecified as the cause of abnormal reaction of the patient, or of later complication, without mention of misadventure at the time of the procedure: Secondary | ICD-10-CM

## 2017-10-20 DIAGNOSIS — I219 Acute myocardial infarction, unspecified: Secondary | ICD-10-CM | POA: Diagnosis not present

## 2017-10-20 DIAGNOSIS — D696 Thrombocytopenia, unspecified: Secondary | ICD-10-CM

## 2017-10-20 DIAGNOSIS — D598 Other acquired hemolytic anemias: Secondary | ICD-10-CM

## 2017-10-20 DIAGNOSIS — D594 Other nonautoimmune hemolytic anemias: Secondary | ICD-10-CM

## 2017-10-20 DIAGNOSIS — T8203XA Leakage of heart valve prosthesis, initial encounter: Secondary | ICD-10-CM

## 2017-10-20 DIAGNOSIS — D591 Other autoimmune hemolytic anemias: Secondary | ICD-10-CM | POA: Diagnosis not present

## 2017-10-20 DIAGNOSIS — I2119 ST elevation (STEMI) myocardial infarction involving other coronary artery of inferior wall: Secondary | ICD-10-CM | POA: Diagnosis not present

## 2017-10-20 LAB — CBC WITH DIFFERENTIAL/PLATELET
Basophils Absolute: 0 10*3/uL (ref 0.0–0.1)
Basophils Relative: 0 %
EOS ABS: 0 10*3/uL (ref 0.0–0.5)
EOS PCT: 1 %
HCT: 38.6 % (ref 38.4–49.9)
HEMOGLOBIN: 11.7 g/dL — AB (ref 13.0–17.1)
LYMPHS ABS: 0.9 10*3/uL (ref 0.9–3.3)
LYMPHS PCT: 15 %
MCH: 28.1 pg (ref 27.2–33.4)
MCHC: 30.3 g/dL — AB (ref 32.0–36.0)
MCV: 92.8 fL (ref 79.3–98.0)
MONOS PCT: 7 %
Monocytes Absolute: 0.4 10*3/uL (ref 0.1–0.9)
Neutro Abs: 4.7 10*3/uL (ref 1.5–6.5)
Neutrophils Relative %: 77 %
PLATELETS: 239 10*3/uL (ref 140–400)
RBC: 4.16 MIL/uL — ABNORMAL LOW (ref 4.20–5.82)
RDW: 15.6 % — ABNORMAL HIGH (ref 11.0–14.6)
WBC: 6.1 10*3/uL (ref 4.0–10.3)

## 2017-10-20 LAB — RETICULOCYTES
RBC.: 4.16 MIL/uL — AB (ref 4.20–5.82)
Retic Count, Absolute: 54.1 10*3/uL (ref 34.8–93.9)
Retic Ct Pct: 1.3 % (ref 0.8–1.8)

## 2017-10-20 LAB — LACTATE DEHYDROGENASE: LDH: 280 U/L — ABNORMAL HIGH (ref 125–245)

## 2017-10-20 NOTE — Telephone Encounter (Signed)
Appointments scheduled AVS printed per 3/27 los °

## 2017-10-21 LAB — HAPTOGLOBIN: HAPTOGLOBIN: 196 mg/dL (ref 34–200)

## 2017-11-11 DIAGNOSIS — Z952 Presence of prosthetic heart valve: Secondary | ICD-10-CM | POA: Diagnosis not present

## 2017-11-11 DIAGNOSIS — E7800 Pure hypercholesterolemia, unspecified: Secondary | ICD-10-CM | POA: Diagnosis not present

## 2017-11-11 DIAGNOSIS — Z0000 Encounter for general adult medical examination without abnormal findings: Secondary | ICD-10-CM | POA: Diagnosis not present

## 2017-11-11 DIAGNOSIS — Z23 Encounter for immunization: Secondary | ICD-10-CM | POA: Diagnosis not present

## 2017-11-11 DIAGNOSIS — Z125 Encounter for screening for malignant neoplasm of prostate: Secondary | ICD-10-CM | POA: Diagnosis not present

## 2017-11-17 ENCOUNTER — Telehealth: Payer: Self-pay | Admitting: *Deleted

## 2017-11-17 NOTE — Telephone Encounter (Signed)
   [facility] Pre-operative Risk Assessment    Request for surgical clearance:  1. What type of surgery is being performed? Dental cleaning, could include: radiographs, fillings, crowns, bridges, extraction, root canal therapy   2. When is this surgery scheduled? TBD   3. What type of clearance is required (medical clearance vs. Pharmacy clearance to hold med vs. Both)? both  4. Are there any medications that need to be held prior to surgery and how long? ASA   5. Practice name and name of physician performing surgery? [facility]   6. What is your office phone number [phone]    7.   What is your office fax number [phone]  8.   Anesthesia type (None, local, MAC, general) ? Local anesthetic with epinephrine   Jourden Gilson A Jae Bruck 11/17/2017, 12:23 PM  _________________________________________________________________   (provider comments below)

## 2017-11-17 NOTE — Telephone Encounter (Signed)
   Primary Cardiologist: Chrystie Nose, MD  Chart reviewed as part of pre-operative protocol coverage. Given past medical history and time since last visit, based on ACC/AHA guidelines, Luis Ferguson would be at acceptable risk for the planned procedure without further cardiovascular testing. He does need SBE prophylaxis as he is s/p AVR.   I will route this recommendation to the requesting party via Epic fax function and remove from pre-op pool.  Please call with questions.  Corine Shelter, PA-C 11/17/2017, 4:08 PM

## 2017-11-18 DIAGNOSIS — L400 Psoriasis vulgaris: Secondary | ICD-10-CM | POA: Diagnosis not present

## 2017-11-22 NOTE — Telephone Encounter (Signed)
Resent fax via Epic opt [employer]

## 2017-12-06 DIAGNOSIS — I10 Essential (primary) hypertension: Secondary | ICD-10-CM | POA: Diagnosis not present

## 2017-12-06 DIAGNOSIS — R809 Proteinuria, unspecified: Secondary | ICD-10-CM | POA: Diagnosis not present

## 2017-12-06 DIAGNOSIS — I129 Hypertensive chronic kidney disease with stage 1 through stage 4 chronic kidney disease, or unspecified chronic kidney disease: Secondary | ICD-10-CM | POA: Diagnosis not present

## 2017-12-09 ENCOUNTER — Other Ambulatory Visit: Payer: Self-pay | Admitting: Nephrology

## 2017-12-09 DIAGNOSIS — I129 Hypertensive chronic kidney disease with stage 1 through stage 4 chronic kidney disease, or unspecified chronic kidney disease: Secondary | ICD-10-CM

## 2017-12-09 DIAGNOSIS — R809 Proteinuria, unspecified: Secondary | ICD-10-CM

## 2017-12-14 ENCOUNTER — Ambulatory Visit
Admission: RE | Admit: 2017-12-14 | Discharge: 2017-12-14 | Disposition: A | Discharge status: Home / Self Care | Payer: Commercial Managed Care - PPO | Source: Ambulatory Visit | Attending: Nephrology | Admitting: Nephrology

## 2017-12-14 DIAGNOSIS — I129 Hypertensive chronic kidney disease with stage 1 through stage 4 chronic kidney disease, or unspecified chronic kidney disease: Secondary | ICD-10-CM

## 2017-12-14 DIAGNOSIS — N281 Cyst of kidney, acquired: Secondary | ICD-10-CM | POA: Diagnosis not present

## 2017-12-14 DIAGNOSIS — R809 Proteinuria, unspecified: Secondary | ICD-10-CM

## 2017-12-24 ENCOUNTER — Encounter: Payer: Self-pay | Admitting: Internal Medicine

## 2017-12-24 ENCOUNTER — Ambulatory Visit: Payer: Commercial Managed Care - PPO | Admitting: Internal Medicine

## 2017-12-24 VITALS — BP 144/84 | HR 64 | Ht 66.0 in | Wt 170.8 lb

## 2017-12-24 DIAGNOSIS — Z952 Presence of prosthetic heart valve: Secondary | ICD-10-CM | POA: Diagnosis not present

## 2017-12-24 DIAGNOSIS — D598 Other acquired hemolytic anemias: Secondary | ICD-10-CM

## 2017-12-24 NOTE — Patient Instructions (Addendum)
Your physician has recommended you make the following change in your medication: STOP aspirin  Your physician wants you to follow-up in: ONE YEAR with Dr. Rennis Golden with echocardiogram prior. You will receive a reminder letter in the mail two months in advance. If you don't receive a letter, please call our office to schedule the follow-up appointment.

## 2017-12-24 NOTE — Progress Notes (Signed)
OFFICE NOTE  Chief Complaint:  Routine follow-up  Primary Care Physician: Milus Height, PA-C  HPI:  Luis Ferguson is a pleasant 57 year old male who is coming referred to me for an aortic murmur. He underwent an echocardiogram recently which demonstrated an EF of 55-60%, moderate calcific aortic valve stenosis without mention of a bicuspid valve or not, and moderate regurgitation. He apparently was told he had a heart murmur when he was child but this was not followed up on. He denies any chest pain or worsening shortness of breath. He does have a history of dyslipidemia is well and was diagnosed and is on treatment for lupus. There is no family history of congenital heart disease to his knowledge.  01/08/2016  Mr. Garverick returns today for follow-up. Overall he remains asymptomatic. He denies any chest pain or worsening shortness of breath. A repeat echocardiogram was performed which shows stable aortic stenosis and insufficiency. EF remains preserved at 55-60%. The left atrium was moderately dilated.  01/15/2017  Mr. Geeslin returns today for follow-up. He recently had a repeat echo which now shows severe aortic stenosis. The aortic valve severely calcified with restricted leaflet motion. There was moderate regurgitation. The mean gradient was 47 mmHg. Left atrium was mildly dilated and LVEF was normal. I discussed the findings with him today and he reports he is for the most part asymptomatic. He works about 10 hours a day and when he comes home and goes and mows the lawn. 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 He reports lifting and being very active without any chest pain, shortness of breath, presyncope, syncope or other associated symptoms. Although he meets criteria by the echo for valve replacement, since he is asymptomatic we could consider watchful waiting at this time.  06/15/2017  Mr. Myklebust was seen today in follow-up.  He had a  repeat echocardiogram to follow-up on aortic stenosis.  This was noted to be severe with a mean gradient of 47 mmHg and moderate aortic insufficiency.  6 months of now past and I asked him to watch his symptoms very closely.  He now notes that he has become more short of breath and his wife particularly notes that he fatigues much easier.  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  We did repeat his echo which showed preserved LV function however he is aortic valve gradient is slightly worse, but overall similar to the findings 6 months ago.  Based on his new symptomatology, I am recommending aortic valve replacement.  09/08/2017  Mr. Bladen returns today for follow-up of aortic valve surgery.  He underwent minimally invasive aortic valve replacement by Dr. Dorris Fetch on 07/12/2017, through a right mini thoracotomy.  He had an Counselling psychologist aortic valve placed, size 21 mm. Unfortunately, there was a small to moderate perivalvular leak noted after chest closure.  Subsequently he has struggled with symptoms of heart failure as well as symptomatic anemia.  He was noted to have severe hemolytic anemia requiring transfusion and has undergone extensive workup by Dr. Darnelle Catalan, including treatment with steroids, however his hemolysis persists.  It is felt that possibly the hemolysis is related to his perivalvular leak.  He is seen Dr. Dorris Fetch and we have spoken about pursuing a transesophageal echocardiogram to better understand the severity of his perivalvular leak as well as to characterize the orifice for repair.  In speaking  with Dr. Dorris Fetch, we discussed possible options which might include an attempted device closure with an Amplatzer type device if this is feasible, otherwise he may need a redo surgery which would likely be through median sternotomy.  12/24/2017  Mr. Zahler returns today for follow-up.  He is doing very well.   Fortunately he had his valve replaced a second time and has had a good result with that.  He denies any chest pain or worsening shortness of breath.  His energy level is good.  Is notable that he still on full dose aspirin however is not likely necessary at this point.  He has been released by his Careers adviser.  Recent lab work actually shows good control of his cholesterol.  Total cholesterol 128, HDL 28 LDL 71 and triglycerides 145.  Hemoglobin was 11.7 in March.  Platelet count of 239.  PMHx:  Past Medical History:  Diagnosis Date  . Anemia   . Aortic stenosis   . Arthritis    achiness in shoulder , elbows  . Connective tissue disease (HCC)    followed by Dr. Kathi Ludwig, GMA, pt. off Prednisone since mid 2017  . Fatigue   . Headache   . Perivalvular leak of prosthetic heart valve   . PONV (postoperative nausea and vomiting)   . Psoriasis    below the knee on both legs   . Wears glasses     Past Surgical History:  Procedure Laterality Date  . AORTIC VALVE REPLACEMENT  06/2017  . AORTIC VALVE REPLACEMENT N/A 09/17/2017   Procedure: REDO AORTIC VALVE REPLACEMENT (AVR);  Surgeon: [name], MD;  [facility] OR;  Service: Open Heart Surgery;  Laterality: N/A;  . CARDIAC CATHETERIZATION    . HERNIA REPAIR  2016   left and right  . RIGHT/LEFT HEART CATH AND CORONARY ANGIOGRAPHY N/A 06/16/2017   Procedure: RIGHT/LEFT HEART CATH AND CORONARY ANGIOGRAPHY;  Surgeon: Lyn Records, MD;  Location: [facility] INVASIVE CV LAB;  Service: Cardiovascular;  Laterality: N/A;  . TEE WITHOUT CARDIOVERSION N/A 07/12/2017   Procedure: TRANSESOPHAGEAL ECHOCARDIOGRAM (TEE);  Surgeon: [name], MD;  Location[facility] OR;  Service: Open Heart Surgery;  Laterality: N/A;  . TEE WITHOUT CARDIOVERSION N/A 09/09/2017   Procedure: TRANSESOPHAGEAL ECHOCARDIOGRAM (TEE);  Surgeon: Thurmon Fair, MD;  Location: [facility] ENDOSCOPY;  Service: Cardiovascular;  Laterality: N/A;  . TEE WITHOUT CARDIOVERSION N/A 09/17/2017    Procedure: TRANSESOPHAGEAL ECHOCARDIOGRAM (TEE);  Surgeon: [name], MD;  Location: [facility] OR;  Service: Open Heart Surgery;  Laterality: N/A;    FAMHx:  Family History  Problem Relation Age of Onset  . Diabetes Mother        also HTN  . CAD Father 81       CABGx3  . Dementia Father   . Liver cancer Brother   . Diabetes Brother     SOCHx:   reports that he has never smoked. He has never used smokeless tobacco. He reports that he drinks alcohol. He reports that he does not use drugs.  ALLERGIES:  Allergies  Allergen Reactions  . Ace Inhibitors Cough  . Codeine Itching and Rash    ROS: Pertinent items noted in HPI and remainder of comprehensive ROS otherwise negative.  HOME MEDS: Current Outpatient Medications  Medication Sig Dispense Refill  . aspirin EC 325 MG EC tablet Take 1 tablet (325 mg total) by mouth daily. 30 tablet 0  . atorvastatin (LIPITOR) 20 MG tablet Take 20 mg by mouth daily.     [name]  calcipotriene (DOVONOX) 0.005 % cream Apply 1 application topically 2 (two) times daily as needed (psoriasis).     . cholecalciferol (VITAMIN D) 1000 UNITS tablet Take 1,000 Units by mouth daily.    . clobetasol ointment (TEMOVATE) 0.05 % Apply 1 application topically 2 (two) times daily as needed (psoriasis).     . fexofenadine (ALLEGRA) 60 MG tablet Take 60 mg by mouth daily.    . hydroxychloroquine (PLAQUENIL) 200 MG tablet Take 400 mg by mouth daily.     . metoprolol tartrate (LOPRESSOR) 50 MG tablet Take 1 tablet (50 mg total) by mouth 2 (two) times daily. 180 tablet 3  . Omega-3 Fatty Acids (FISH OIL PO) Take 360 mg by mouth daily.    . tamsulosin (FLOMAX) 0.4 MG CAPS capsule Take 0.4 mg by mouth daily after breakfast.      No current facility-administered medications for this visit.     LABS/IMAGING: No results found for this or any previous visit (from the past 48 hour(s)). No results found.  WEIGHTS: Wt Readings from Last 3 Encounters:  12/24/17 170 lb  12.8 oz (77.5 kg)  10/20/17 169 lb 3.2 oz (76.7 kg)  10/19/17 162 lb (73.5 kg)    VITALS: BP (!) 144/84   Pulse 64   Ht 5\' 6"  (1.676 m)   Wt 170 lb 12.8 oz (77.5 kg)   BMI 27.57 kg/m   EXAM: General appearance: alert and no distress Neck: no carotid bruit, no JVD and thyroid not enlarged, symmetric, no tenderness/mass/nodules Lungs: clear to auscultation bilaterally Heart: regular rate and rhythm, S1, S2 normal and diastolic murmur: mid diastolic 3/6, blowing at apex Abdomen: soft, non-tender; bowel sounds normal; no masses,  no organomegaly Extremities: edema 1+ edema, acrocyanosis Pulses: 2+ and symmetric Skin: pale, mild jaundice, mild scleral icterus Neurologic: Grossly normal Psych: Pleasant  EKG: Sinus rhythm at 64, minimal voltage criteria for LVH-personally reviewed  ASSESSMENT: 1. Severe symptomatic calcific aortic valve stenosis - s/p AVR with mild to moderate peri-valvular leak 2. Moderate aortic insufficiency 3. Dyslipidemia 4. Lupus/MCTD  PLAN: 1.   Mr. Zentner had a hemolytic anemia secondary to displaced strapless aortic valve.  He subsequently underwent replacement with a bioprosthetic valve and is doing well.  His anemia has resolved.  His cholesterol is now at goal.  He will need a repeat echo in about a year.  I would recommend discontinuing aspirin as it is not a clear ongoing indication.  Follow-up with me annually or sooner as necessary.  Chrystie Nose, MD, [facility], FACP  [place]  [facility]  Medical Director of the Advanced Lipid Disorders &  Cardiovascular Risk Reduction Clinic Attending Cardiologist  Direct Dial: [phone]  Fax: [phone]  Website:  [email or web address] Nicely Jsean Taussig 12/24/2017, 4:23 PM

## 2017-12-30 DIAGNOSIS — L400 Psoriasis vulgaris: Secondary | ICD-10-CM | POA: Diagnosis not present

## 2018-01-19 DIAGNOSIS — M79642 Pain in left hand: Secondary | ICD-10-CM | POA: Diagnosis not present

## 2018-01-19 DIAGNOSIS — M329 Systemic lupus erythematosus, unspecified: Secondary | ICD-10-CM | POA: Diagnosis not present

## 2018-01-19 DIAGNOSIS — M79641 Pain in right hand: Secondary | ICD-10-CM | POA: Diagnosis not present

## 2018-01-19 DIAGNOSIS — D591 Other autoimmune hemolytic anemias: Secondary | ICD-10-CM | POA: Diagnosis not present

## 2018-01-19 DIAGNOSIS — L4050 Arthropathic psoriasis, unspecified: Secondary | ICD-10-CM | POA: Diagnosis not present

## 2018-01-19 DIAGNOSIS — M19042 Primary osteoarthritis, left hand: Secondary | ICD-10-CM | POA: Diagnosis not present

## 2018-01-19 DIAGNOSIS — M19041 Primary osteoarthritis, right hand: Secondary | ICD-10-CM | POA: Diagnosis not present

## 2018-01-20 ENCOUNTER — Other Ambulatory Visit: Payer: Self-pay | Admitting: Oncology

## 2018-01-20 ENCOUNTER — Inpatient Hospital Stay: Payer: Commercial Managed Care - PPO | Attending: Oncology

## 2018-01-20 DIAGNOSIS — D594 Other nonautoimmune hemolytic anemias: Secondary | ICD-10-CM

## 2018-01-20 DIAGNOSIS — D591 Other autoimmune hemolytic anemias: Secondary | ICD-10-CM | POA: Insufficient documentation

## 2018-01-20 LAB — LACTATE DEHYDROGENASE: LDH: 314 U/L — AB (ref 98–192)

## 2018-01-20 LAB — RETICULOCYTES
RBC.: 4.27 MIL/uL (ref 4.20–5.82)
Retic Count, Absolute: 34.2 10*3/uL — ABNORMAL LOW (ref 34.8–93.9)
Retic Ct Pct: 0.8 % (ref 0.8–1.8)

## 2018-01-20 LAB — CBC WITH DIFFERENTIAL/PLATELET
Basophils Absolute: 0 10*3/uL (ref 0.0–0.1)
Basophils Relative: 0 %
EOS ABS: 0.1 10*3/uL (ref 0.0–0.5)
Eosinophils Relative: 2 %
HCT: 34.8 % — ABNORMAL LOW (ref 38.4–49.9)
Hemoglobin: 11 g/dL — ABNORMAL LOW (ref 13.0–17.1)
Lymphocytes Relative: 39 %
Lymphs Abs: 1.1 10*3/uL (ref 0.9–3.3)
MCH: 25.8 pg — AB (ref 27.2–33.4)
MCHC: 31.6 g/dL — AB (ref 32.0–36.0)
MCV: 81.5 fL (ref 79.3–98.0)
MONOS PCT: 14 %
Monocytes Absolute: 0.4 10*3/uL (ref 0.1–0.9)
NEUTROS PCT: 45 %
Neutro Abs: 1.4 10*3/uL — ABNORMAL LOW (ref 1.5–6.5)
PLATELETS: 138 10*3/uL — AB (ref 140–400)
RBC: 4.27 MIL/uL (ref 4.20–5.82)
RDW: 17.5 % — AB (ref 11.0–14.6)
WBC: 3 10*3/uL — AB (ref 4.0–10.3)

## 2018-01-20 NOTE — Progress Notes (Signed)
Luis Ferguson has been off prednisone now for about 8 weeks.  He has a very stable hemoglobin which is going to vary between 10.5 and 11.5 mostly, given his other rheumatologic diagnoses.  Incidentally he tells me he has significant psoriasis now.  At this point I feel comfortable releasing him for follow-up.  He knows that I will be glad to see him again at any point in the future if any further blood problems arise.

## 2018-01-21 LAB — HAPTOGLOBIN: HAPTOGLOBIN: 155 mg/dL (ref 34–200)

## 2018-04-09 IMAGING — DX DG CHEST 1V PORT
1 series · 1 of 1 positions shown · non-contrast
Comparison: 07/09/2017

CLINICAL DATA: Aortic stenosis. Status post aortic valve
replacement.

EXAM:
PORTABLE CHEST 1 VIEW

[chest ap]
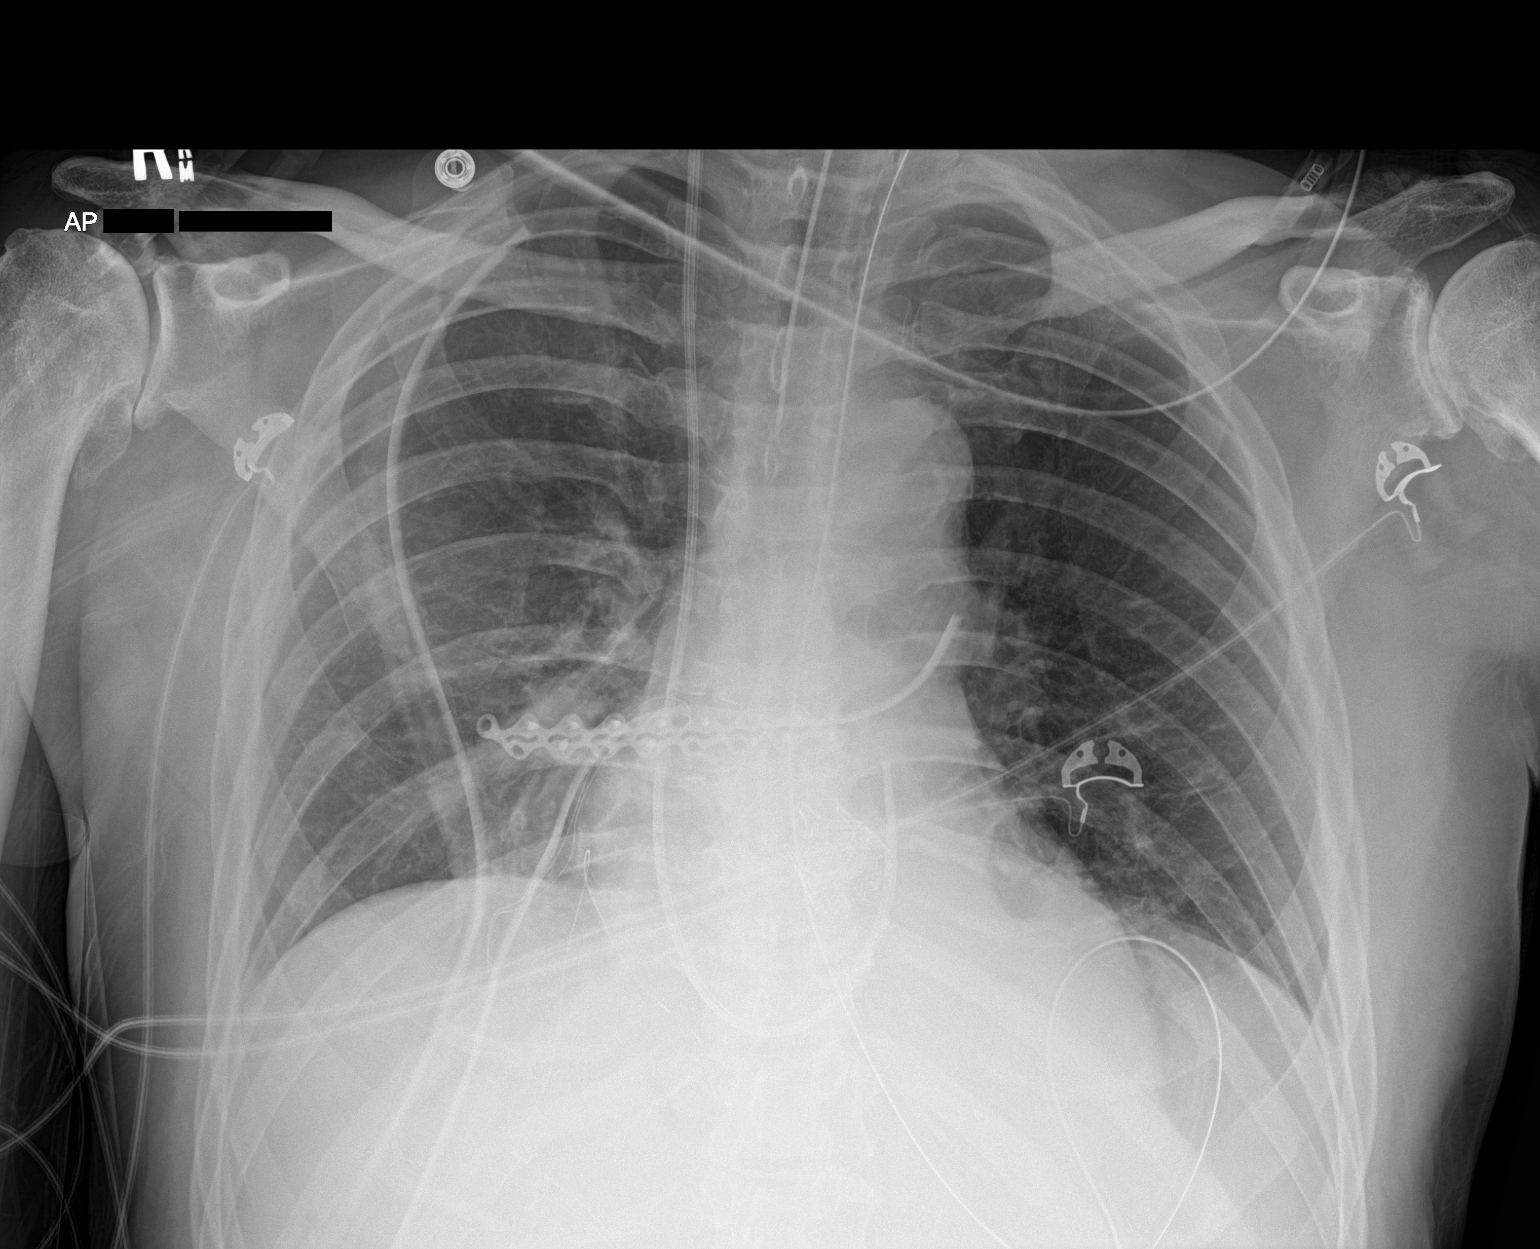

[1 of 1 positions shown; findings below may reference images not displayed]

FINDINGS: Endotracheal tube, Swan-Ganz catheter, NG tube, and chest tubes
appear in good position. No pneumothorax.

Heart size and vascularity are normal. Aortic atherosclerosis.
Prosthetic aortic valve in place.

Minimal atelectasis at the lung bases medially.

No acute bone abnormality. Severe arthritic changes of both
shoulders, right greater than left.
IMPRESSION: Minimal atelectasis at the lung bases medially.

## 2018-04-18 IMAGING — CR DG CHEST 2V
2 series · 2 of 2 positions shown · non-contrast
Comparison: 07/16/2017

CLINICAL DATA: Bilateral pleural effusion. Status post aortic
replacement. Weakness, cough.

EXAM:
CHEST  2 VIEW

[w chest pa]
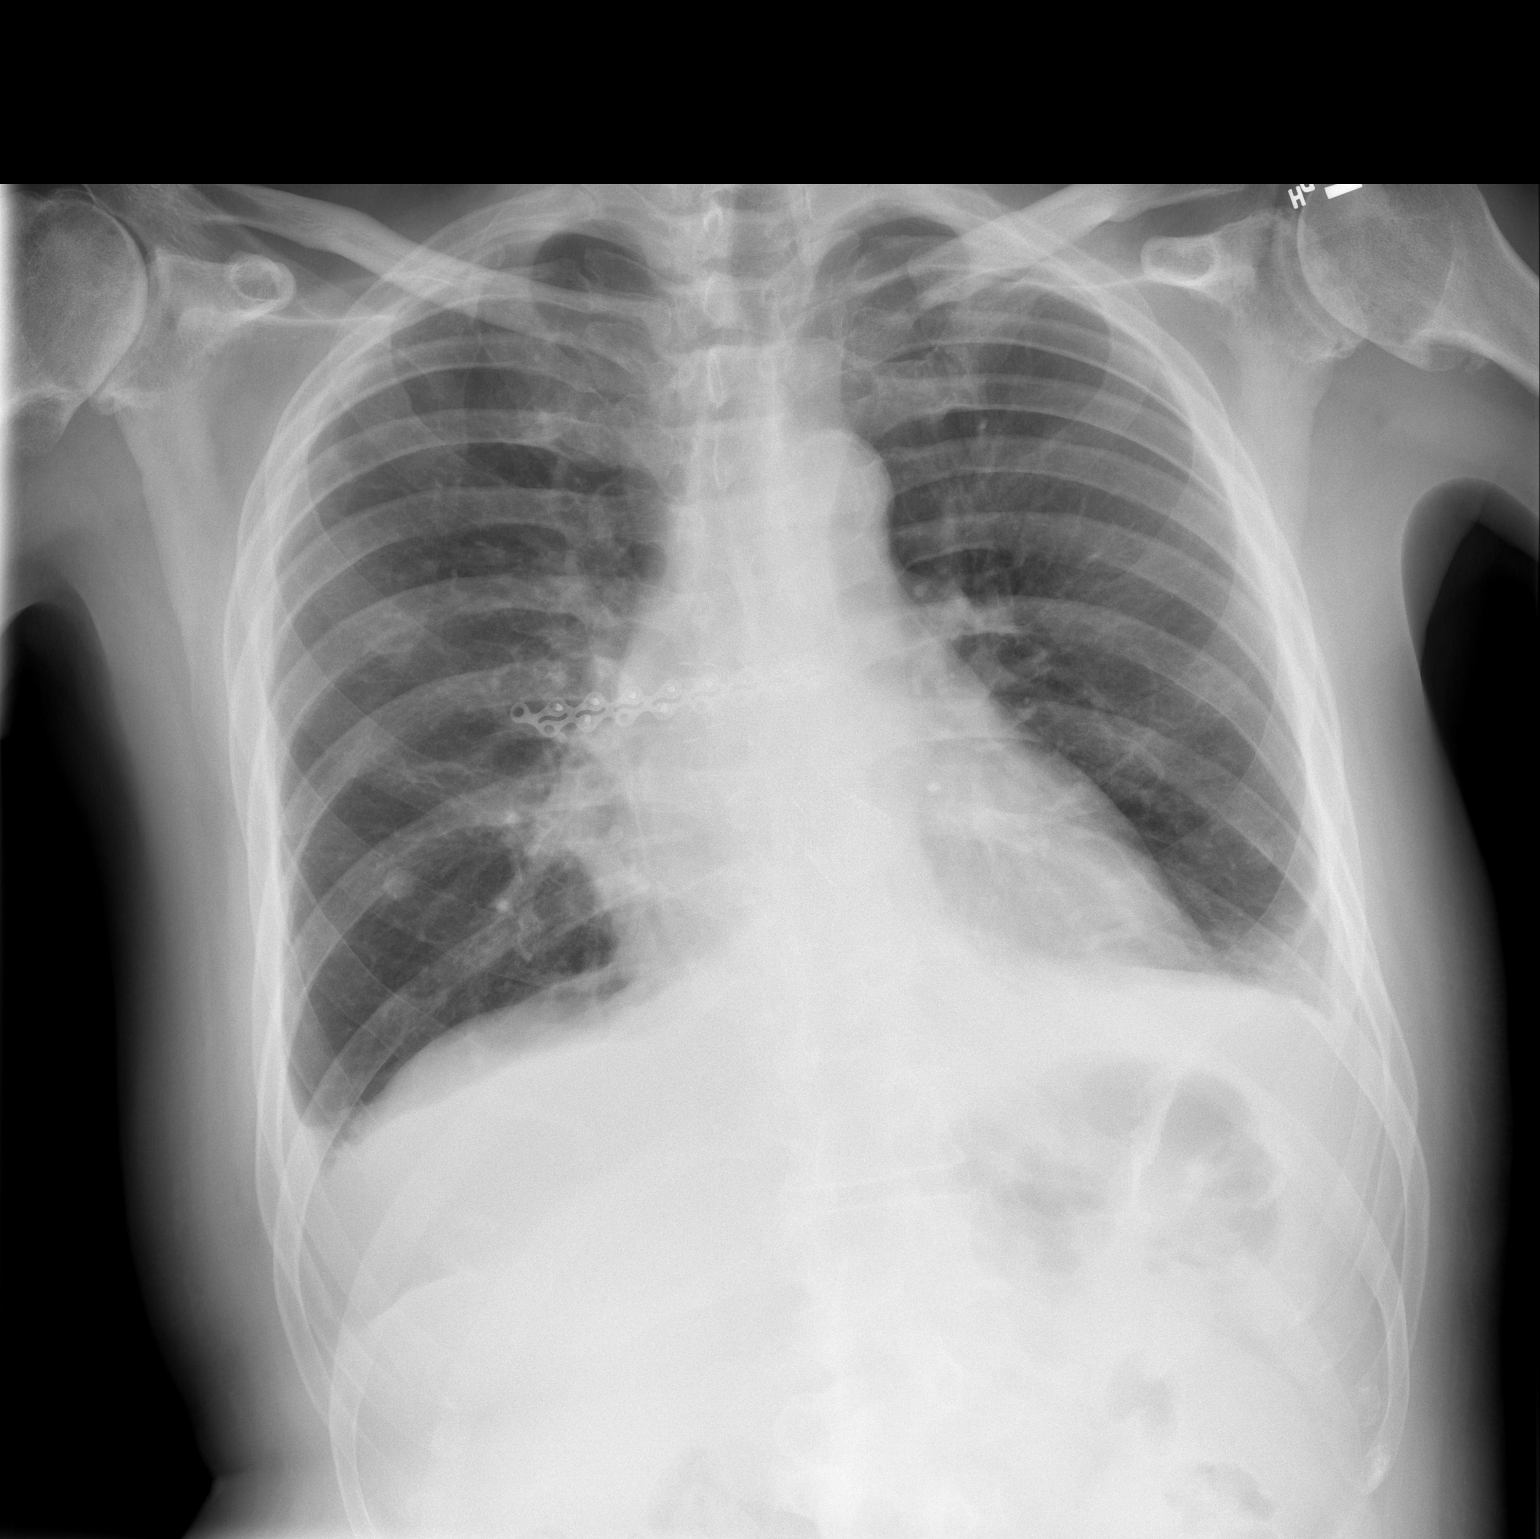

[w chest lat]
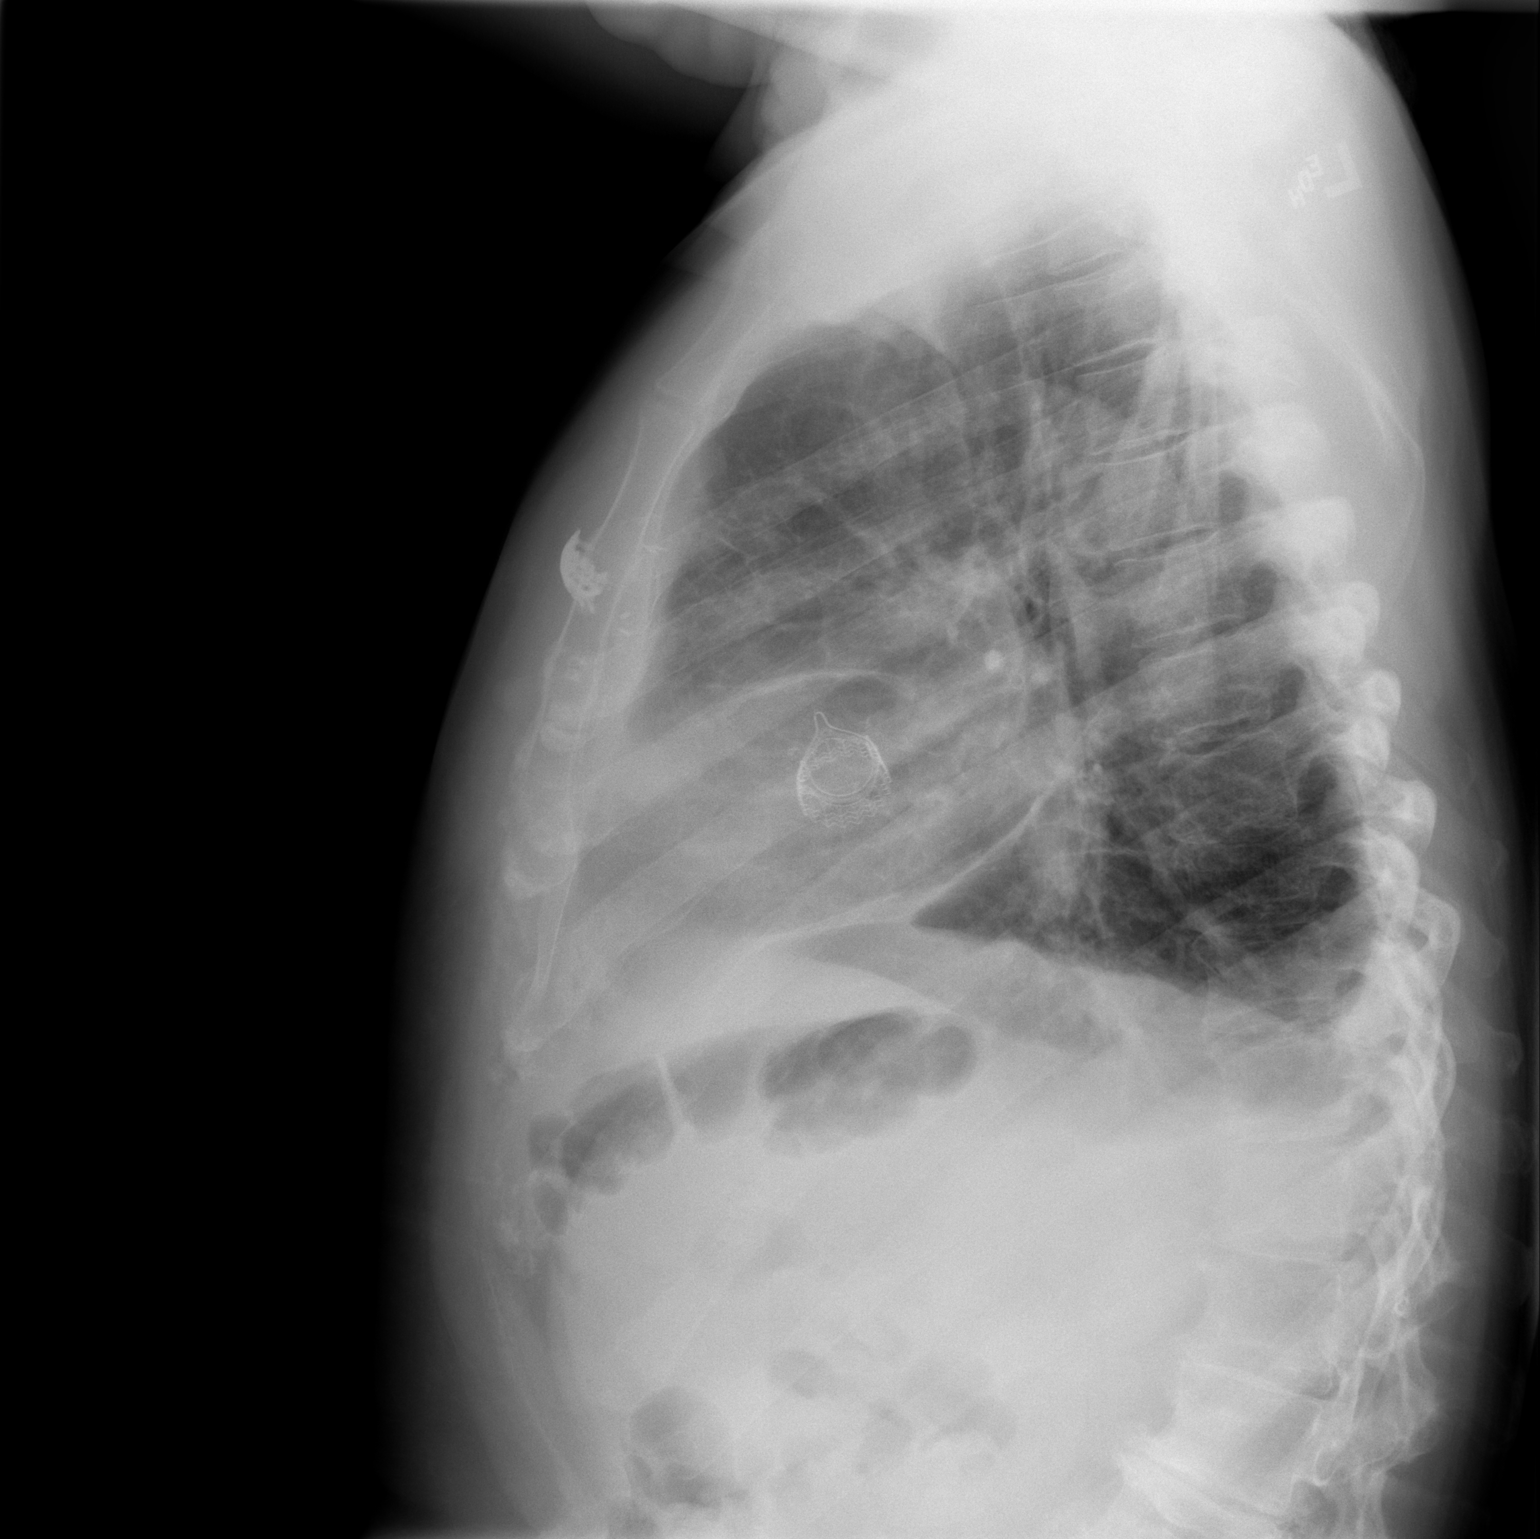

[2 of 2 positions shown; findings below may reference images not displayed]

FINDINGS: Improving lung volumes with decreasing bibasilar atelectasis. Small
effusions remain. Changes of prior aortic valve replacement. Mild
cardiomegaly.
IMPRESSION: Improving aeration with decreasing bibasilar atelectasis. Continued
small effusions. No pneumothorax.

## 2018-04-26 DIAGNOSIS — H40013 Open angle with borderline findings, low risk, bilateral: Secondary | ICD-10-CM | POA: Diagnosis not present

## 2018-06-14 IMAGING — CR DG CHEST 2V
2 series · 2 of 2 positions shown · non-contrast
Comparison: Chest x-ray of 08/31/2017

CLINICAL DATA: Pre-admit for redo of cardiac valve prosthesis

EXAM:
CHEST  2 VIEW

[w chest pa]
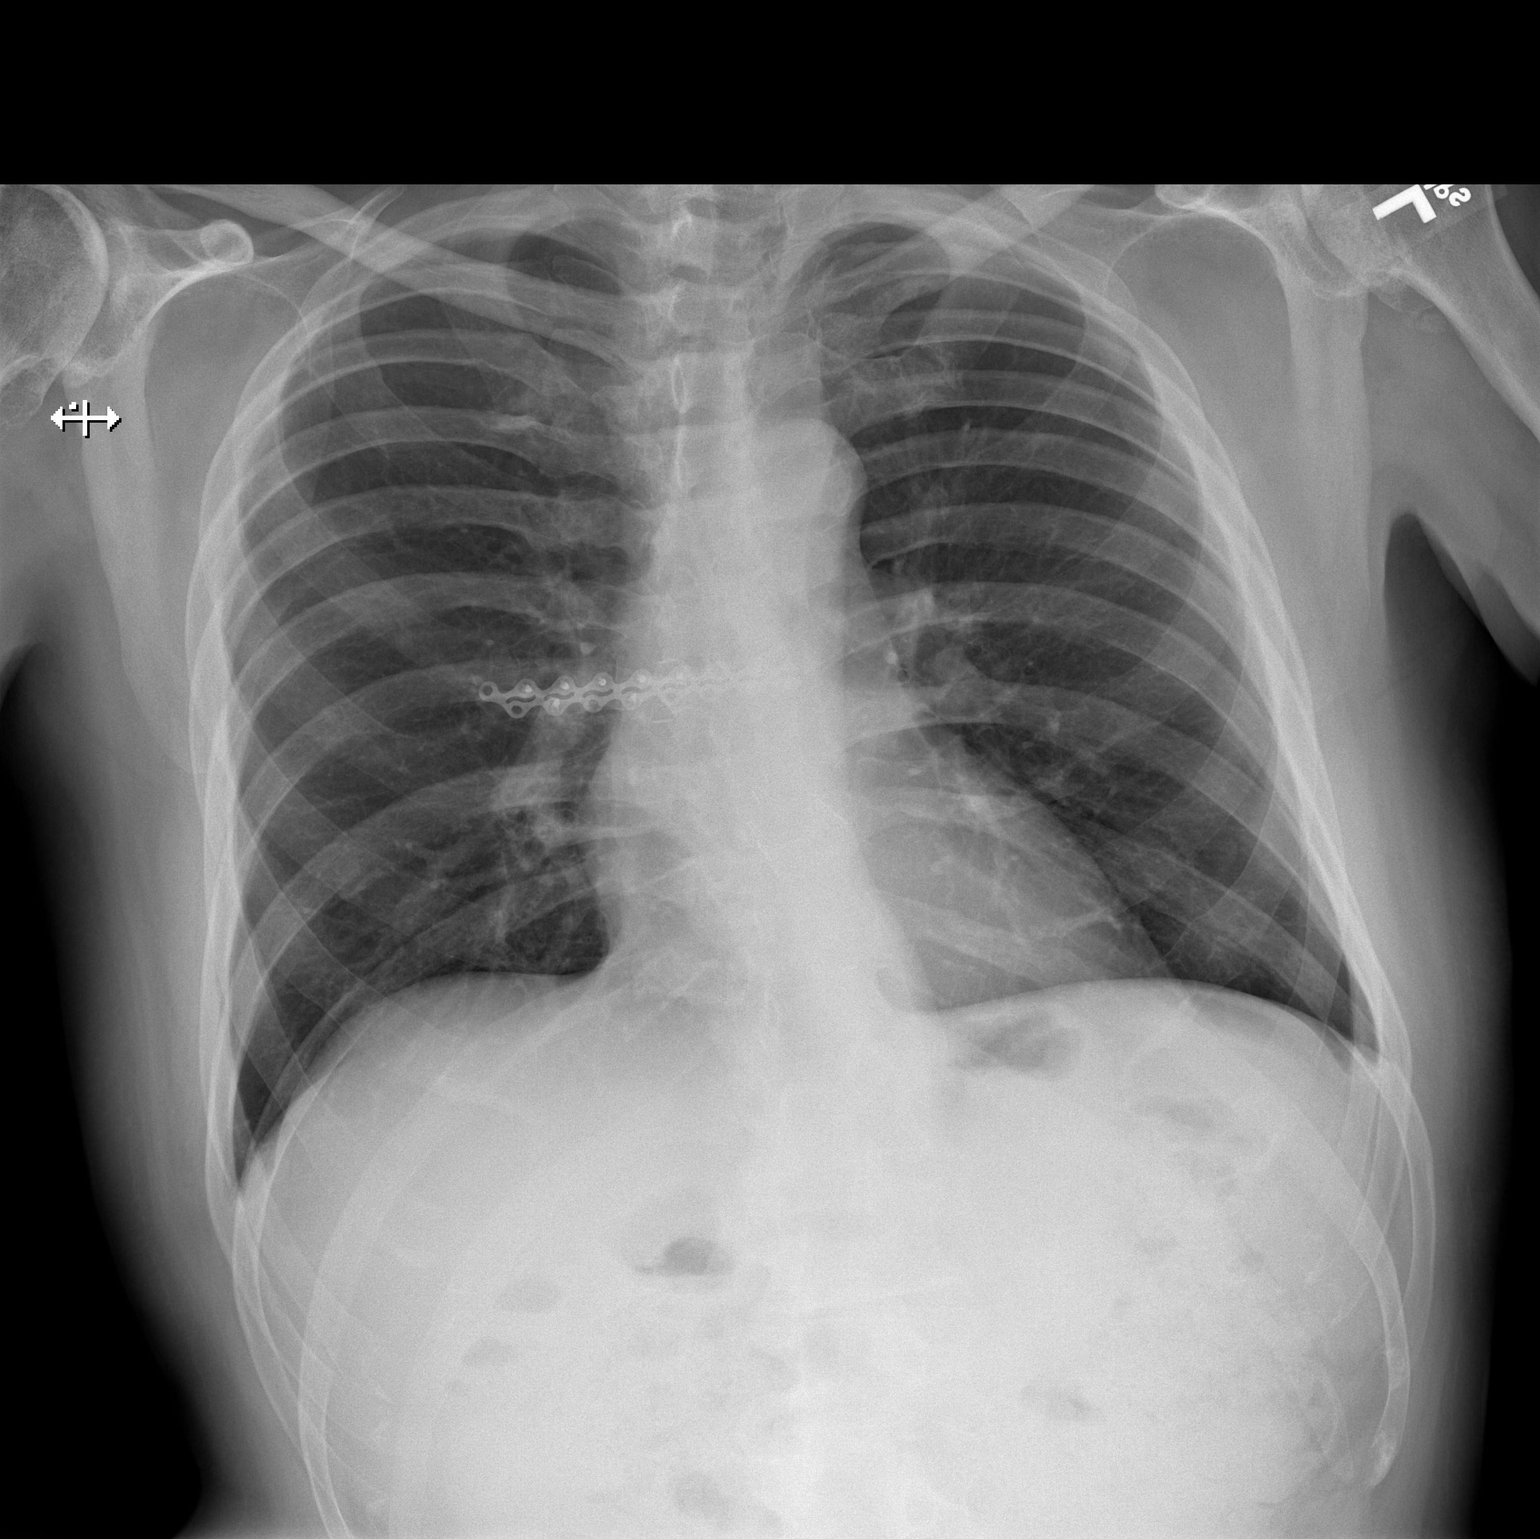

[w chest lat]
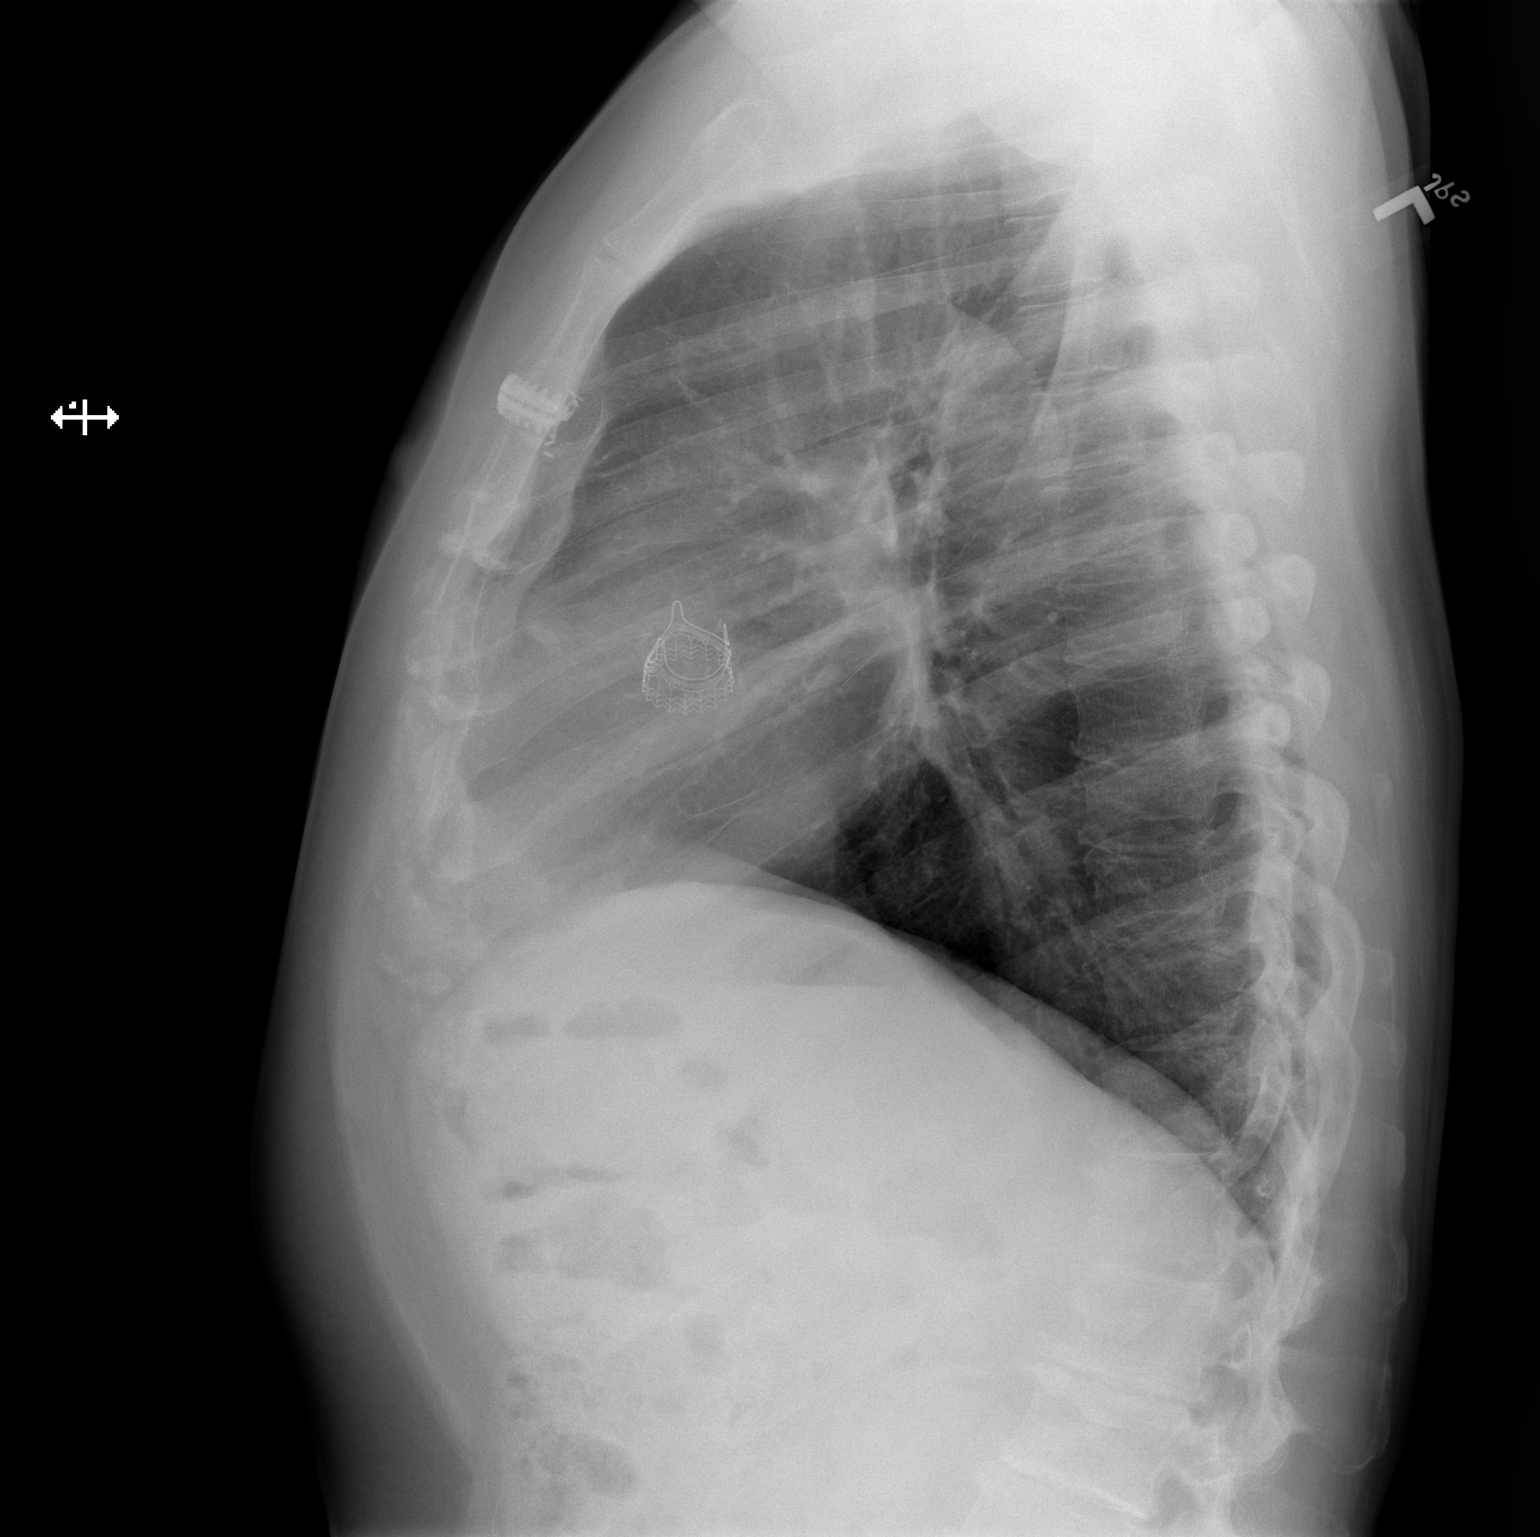

[2 of 2 positions shown; findings below may reference images not displayed]

FINDINGS: No active infiltrate or effusion is seen. Mediastinal and hilar
contours are unremarkable. Prosthetic aortic valve is noted.
Fixation plate and screws are noted across the mid chest. The heart
is within normal limits in size. There is a mild curvature of the
thoracic spine convex to the right.
IMPRESSION: No active cardiopulmonary disease.

## 2018-06-16 IMAGING — DX DG CHEST 1V PORT
1 series · 1 of 1 positions shown · non-contrast
Comparison: September 17, 2017

CLINICAL DATA: Status post surgery.

EXAM:
PORTABLE CHEST 1 VIEW

[chest ap]
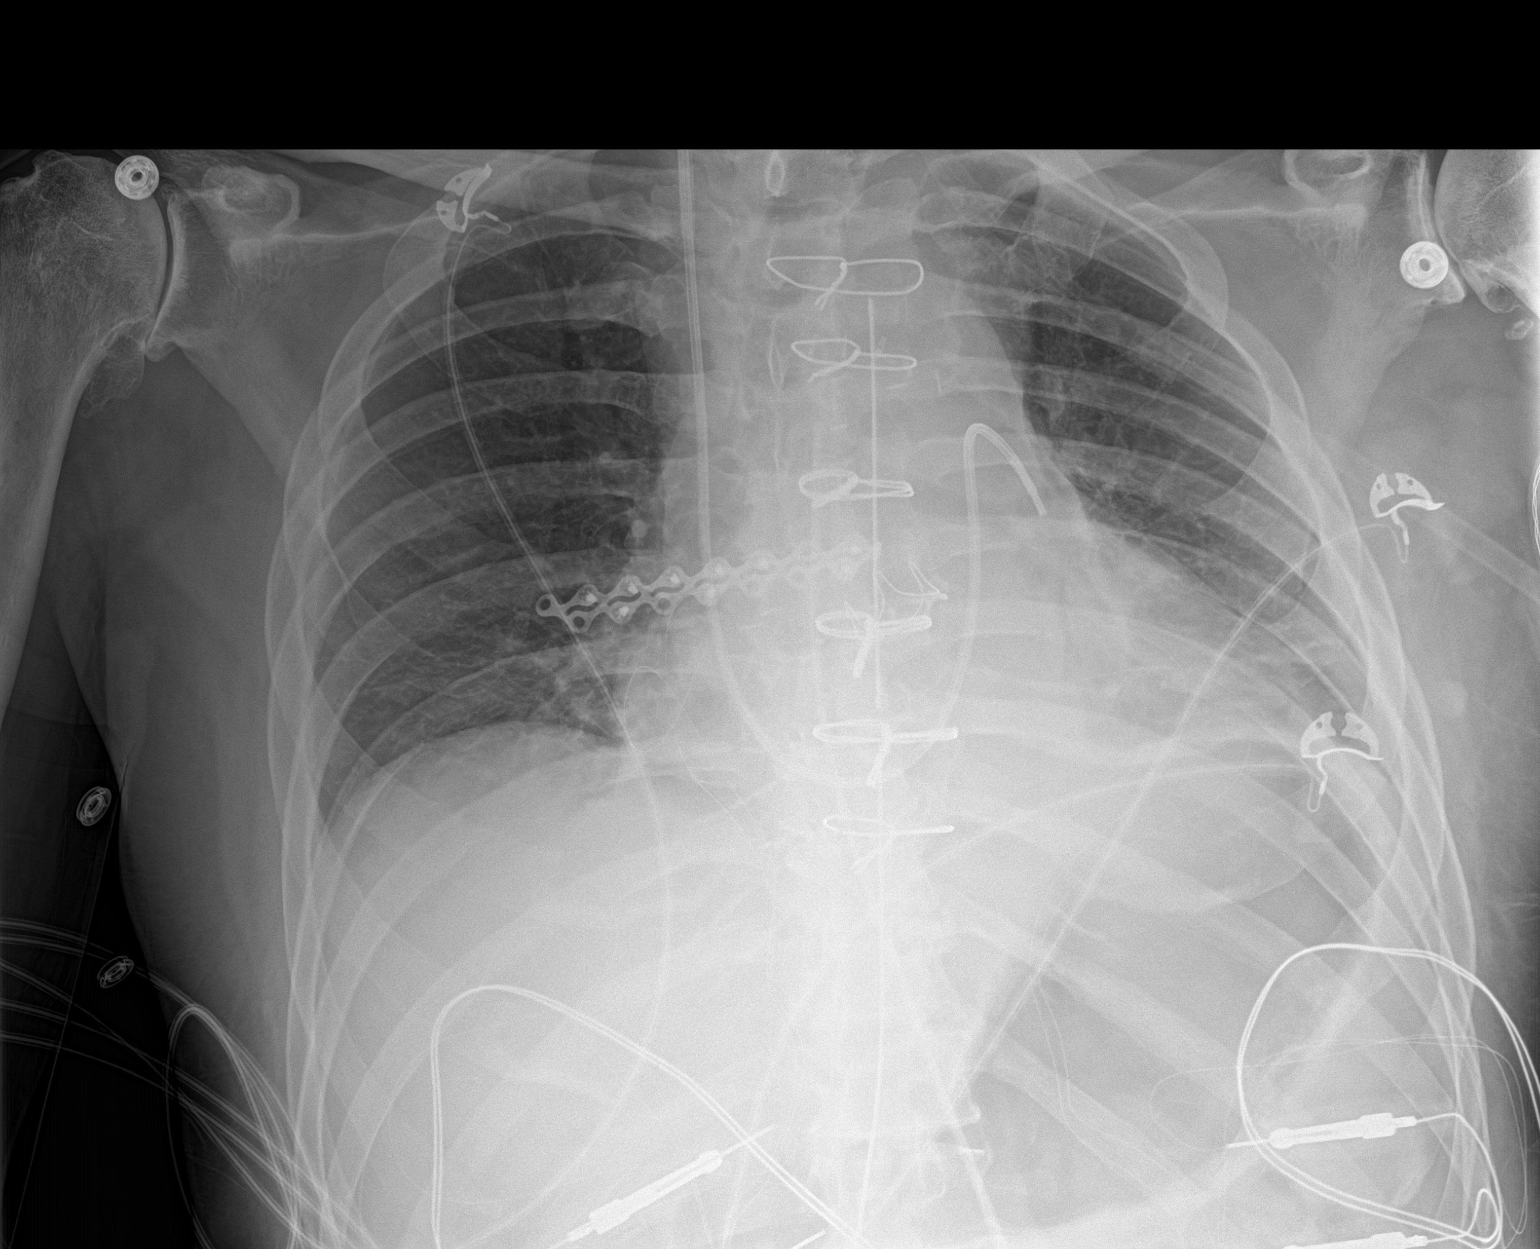

[1 of 1 positions shown; findings below may reference images not displayed]

FINDINGS: The ET and NG tubes have been removed. Other support apparatus is
stable. The cardiomediastinal silhouette is stable. Mild atelectasis
in the left base. No nodules, masses, other infiltrates, or
pneumothorax.
IMPRESSION: 1. Support apparatus as above.
2. No other acute abnormalities.

## 2018-06-28 DIAGNOSIS — Z79899 Other long term (current) drug therapy: Secondary | ICD-10-CM | POA: Diagnosis not present

## 2018-06-28 DIAGNOSIS — L4050 Arthropathic psoriasis, unspecified: Secondary | ICD-10-CM | POA: Diagnosis not present

## 2018-06-28 DIAGNOSIS — M329 Systemic lupus erythematosus, unspecified: Secondary | ICD-10-CM | POA: Diagnosis not present

## 2018-07-12 DIAGNOSIS — R809 Proteinuria, unspecified: Secondary | ICD-10-CM | POA: Diagnosis not present

## 2018-07-12 DIAGNOSIS — I10 Essential (primary) hypertension: Secondary | ICD-10-CM | POA: Diagnosis not present

## 2018-07-26 DIAGNOSIS — M810 Age-related osteoporosis without current pathological fracture: Secondary | ICD-10-CM | POA: Diagnosis not present

## 2018-07-26 DIAGNOSIS — H353131 Nonexudative age-related macular degeneration, bilateral, early dry stage: Secondary | ICD-10-CM | POA: Diagnosis not present

## 2018-07-26 DIAGNOSIS — Z79899 Other long term (current) drug therapy: Secondary | ICD-10-CM | POA: Diagnosis not present

## 2018-07-26 DIAGNOSIS — H40013 Open angle with borderline findings, low risk, bilateral: Secondary | ICD-10-CM | POA: Diagnosis not present

## 2018-07-26 DIAGNOSIS — H524 Presbyopia: Secondary | ICD-10-CM | POA: Diagnosis not present

## 2018-07-26 DIAGNOSIS — M329 Systemic lupus erythematosus, unspecified: Secondary | ICD-10-CM | POA: Diagnosis not present

## 2018-10-18 ENCOUNTER — Other Ambulatory Visit: Payer: Self-pay | Admitting: Physician Assistant

## 2018-11-24 DIAGNOSIS — L4050 Arthropathic psoriasis, unspecified: Secondary | ICD-10-CM | POA: Diagnosis not present

## 2018-11-24 DIAGNOSIS — Z79899 Other long term (current) drug therapy: Secondary | ICD-10-CM | POA: Diagnosis not present

## 2018-11-24 DIAGNOSIS — E7800 Pure hypercholesterolemia, unspecified: Secondary | ICD-10-CM | POA: Diagnosis not present

## 2018-11-24 DIAGNOSIS — M329 Systemic lupus erythematosus, unspecified: Secondary | ICD-10-CM | POA: Diagnosis not present

## 2018-12-01 DIAGNOSIS — E7800 Pure hypercholesterolemia, unspecified: Secondary | ICD-10-CM | POA: Diagnosis not present

## 2018-12-07 ENCOUNTER — Telehealth (HOSPITAL_COMMUNITY): Payer: Self-pay | Admitting: *Deleted

## 2018-12-07 NOTE — Telephone Encounter (Signed)
Left message for patient to call the office

## 2018-12-08 ENCOUNTER — Telehealth (HOSPITAL_COMMUNITY): Payer: Self-pay | Admitting: Radiology

## 2018-12-08 NOTE — Telephone Encounter (Signed)

## 2018-12-09 ENCOUNTER — Ambulatory Visit (HOSPITAL_COMMUNITY): Payer: Commercial Managed Care - PPO | Attending: Internal Medicine

## 2018-12-09 ENCOUNTER — Other Ambulatory Visit: Payer: Self-pay

## 2018-12-09 DIAGNOSIS — Z952 Presence of prosthetic heart valve: Secondary | ICD-10-CM

## 2018-12-14 ENCOUNTER — Telehealth: Payer: Self-pay | Admitting: Internal Medicine

## 2018-12-14 NOTE — Telephone Encounter (Signed)
Mychart, no smartphone, consent (verbal), pre reg complete 12/14/18 AF °

## 2018-12-15 ENCOUNTER — Telehealth: Payer: Self-pay

## 2018-12-15 NOTE — Telephone Encounter (Signed)
Virtual Visit Pre-Appointment Phone Call  "(Name), I am calling you today to discuss your upcoming appointment. We are currently trying to limit exposure to the virus that causes COVID-19 by seeing patients at home rather than in the office."  1. "What is the BEST phone number to call the day of the visit?" - include this in appointment notes  2. "Do you have or have access to (through a family member/friend) a smartphone with video capability that we can use for your visit?" a. If yes - list this number in appt notes as "cell" (if different from BEST phone #) and list the appointment type as a VIDEO visit in appointment notes b. If no - list the appointment type as a PHONE visit in appointment notes  3. Confirm consent - "In the setting of the current Covid19 crisis, you are scheduled for a (phone or video) visit with your provider on (date) at (time).  Just as we do with many in-office visits, in order for you to participate in this visit, we must obtain consent.  If you'd like, I can send this to your mychart (if signed up) or email for you to review.  Otherwise, I can obtain your verbal consent now.  All virtual visits are billed to your insurance company just like a normal visit would be.  By agreeing to a virtual visit, we'd like you to understand that the technology does not allow for your provider to perform an examination, and thus may limit your provider's ability to fully assess your condition. If your provider identifies any concerns that need to be evaluated in person, we will make arrangements to do so.  Finally, though the technology is pretty good, we cannot assure that it will always work on either your or our end, and in the setting of a video visit, we may have to convert it to a phone-only visit.  In either situation, we cannot ensure that we have a secure connection.  Are you willing to proceed?" STAFF: Did the patient verbally acknowledge consent to telehealth visit? Document  YES/NO here: consent obtained by Markham Jordan yesterday, 12/14/18  4. Advise patient to be prepared - "Two hours prior to your appointment, go ahead and check your blood pressure, pulse, oxygen saturation, and your weight (if you have the equipment to check those) and write them all down. When your visit starts, your provider will ask you for this information. If you have an Apple Watch or Kardia device, please plan to have heart rate information ready on the day of your appointment. Please have a pen and paper handy nearby the day of the visit as well."  5. Give patient instructions for MyChart download to smartphone OR [email or web address] as below if video visit (depending on what platform provider is using)  6. Inform patient they will receive a phone call 15 minutes prior to their appointment time (may be from unknown caller ID) so they should be prepared to answer    TELEPHONE CALL NOTE  TYGA KACH has been deemed a candidate for a follow-up tele-health visit to limit community exposure during the Covid-19 pandemic. I spoke with the patient via phone to ensure availability of phone/video source, confirm preferred email & phone number, and discuss instructions and expectations.  I reminded KELVIN LUNDE to be prepared with any vital sign and/or heart rhythm information that could potentially be obtained via home monitoring, at the time of his visit. I reminded BENNIE LOUGHNER to  expect a phone call prior to his visit.  Ian Bushman, CMA 12/15/2018 3:29 PM   INSTRUCTIONS FOR DOWNLOADING THE MYCHART APP TO SMARTPHONE  - The patient must first make sure to have activated MyChart and know their login information - If Apple, go to [employer] and type in MyChart in the search bar and download the app. If Android, ask patient to go to [employer] and type in [place] in the search bar and download the app. The app is free but as with any other app downloads, their phone may  require them to verify saved payment information or Apple/Android password.  - The patient will need to then log into the app with their MyChart username and password, and select [facility] as their healthcare provider to link the account. When it is time for your visit, go to the MyChart app, find appointments, and click Begin Video Visit. Be sure to Select Allow for your device to access the Microphone and Camera for your visit. You will then be connected, and your provider will be with you shortly.  **If they have any issues connecting, or need assistance please contact MyChart service desk [phone])**  **If using a computer, in order to ensure the best quality for their visit they will need to use either of the following Internet Browsers: [employer] or Google Chrome**  IF USING DOXIMITY or DOXY.ME - The patient will receive a link just prior to their visit by text.     FULL LENGTH CONSENT FOR TELE-HEALTH VISIT   I hereby voluntarily request, consent and authorize [facility] and its employed or contracted physicians, physician assistants, nurse practitioners or other licensed health care professionals (the Practitioner), to provide me with telemedicine health care services (the "Services") as deemed necessary by the treating Practitioner. I acknowledge and consent to receive the Services by the Practitioner via telemedicine. I understand that the telemedicine visit will involve communicating with the Practitioner through live audiovisual communication technology and the disclosure of certain medical information by electronic transmission. I acknowledge that I have been given the opportunity to request an in-person assessment or other available alternative prior to the telemedicine visit and am voluntarily participating in the telemedicine visit.  I understand that I have the right to withhold or withdraw my consent to the use of telemedicine in the course of my care at  any time, without affecting my right to future care or treatment, and that the Practitioner or I may terminate the telemedicine visit at any time. I understand that I have the right to inspect all information obtained and/or recorded in the course of the telemedicine visit and may receive copies of available information for a reasonable fee.  I understand that some of the potential risks of receiving the Services via telemedicine include:  [name] Delay or interruption in medical evaluation due to technological equipment failure or disruption; . Information transmitted may not be sufficient (e.g. poor resolution of images) to allow for appropriate medical decision making by the Practitioner; and/or  . In rare instances, security protocols could fail, causing a breach of personal health information.  Furthermore, I acknowledge that it is my responsibility to provide information about my medical history, conditions and care that is complete and accurate to the best of my ability. I acknowledge that Practitioner's advice, recommendations, and/or decision may be based on factors not within their control, such as incomplete or inaccurate data provided by me or distortions of diagnostic images or specimens that  may result from electronic transmissions. I understand that the practice of medicine is not an exact science and that Practitioner makes no warranties or guarantees regarding treatment outcomes. I acknowledge that I will receive a copy of this consent concurrently upon execution via email to the email address I last provided but may also request a printed copy by calling the office of [place].    I understand that my insurance will be billed for this visit.   I have read or had this consent read to me. . I understand the contents of this consent, which adequately explains the benefits and risks of the Services being provided via telemedicine.  . I have been provided ample opportunity to ask questions  regarding this consent and the Services and have had my questions answered to my satisfaction. . I give my informed consent for the services to be provided through the use of telemedicine in my medical care  By participating in this telemedicine visit I agree to the above.

## 2018-12-16 ENCOUNTER — Telehealth: Payer: Self-pay | Admitting: Internal Medicine

## 2018-12-16 ENCOUNTER — Telehealth (INDEPENDENT_AMBULATORY_CARE_PROVIDER_SITE_OTHER): Payer: Commercial Managed Care - PPO | Admitting: Internal Medicine

## 2018-12-16 DIAGNOSIS — D649 Anemia, unspecified: Secondary | ICD-10-CM

## 2018-12-16 DIAGNOSIS — Z952 Presence of prosthetic heart valve: Secondary | ICD-10-CM | POA: Diagnosis not present

## 2018-12-16 DIAGNOSIS — E785 Hyperlipidemia, unspecified: Secondary | ICD-10-CM | POA: Diagnosis not present

## 2018-12-16 DIAGNOSIS — D598 Other acquired hemolytic anemias: Secondary | ICD-10-CM

## 2018-12-16 DIAGNOSIS — Z79899 Other long term (current) drug therapy: Secondary | ICD-10-CM

## 2018-12-16 NOTE — Progress Notes (Signed)
Virtual Visit via Telephone Note   This visit type was conducted due to national recommendations for restrictions regarding the COVID-19 Pandemic (e.g. social distancing) in an effort to limit this patient's exposure and mitigate transmission in our community.  Due to his co-morbid illnesses, this patient is at least at moderate risk for complications without adequate follow up.  This format is felt to be most appropriate for this patient at this time.  The patient did not have access to video technology/had technical difficulties with video requiring transitioning to audio format only (telephone).  All issues noted in this document were discussed and addressed.  No physical exam could be performed with this format.  Please refer to the patient's chart for his  consent to telehealth for [facility].   Evaluation Performed:  Telephone follow-up  Date:  12/16/2018   ID:  Luis Ferguson, DOB 1961/07/12, MRN [id number]  Patient Location:  [address] [place] [address]  Provider location:   [address], [address] [place], [place] [address]  PCP:  Milus Height, PA-C  Cardiologist:  Chrystie Nose, MD Electrophysiologist:  None   Chief Complaint:  No complaints  History of Present Illness:    Luis Ferguson is a 58 y.o. male who presents via audio/video conferencing for a telehealth visit today.  [name] is seen today in a phone follow-up.  He is done very well after his second surgery.  He denies any worsening shortness of breath or chest pain.  His energy level has improved.  He had a repeat echocardiogram last week which showed a low bioprosthetic aortic valve gradient with normal function and normal LVEF.  He has not had any repeat testing for anemia which is significant at the time however it is presumably improved.  I would recommend a CBC.  The patient does not have symptoms concerning for COVID-19 infection (fever, chills, cough, or new SHORTNESS OF BREATH).     Prior CV studies:   The following studies were reviewed today:  Echocardiogram  PMHx:  Past Medical History:  Diagnosis Date  . Anemia   . Aortic stenosis   . Arthritis    achiness in shoulder , elbows  . Connective tissue disease (HCC)    followed by Dr. Kathi Ludwig, GMA, pt. off Prednisone since mid 2017  . Fatigue   . Headache   . Perivalvular leak of prosthetic heart valve   . PONV (postoperative nausea and vomiting)   . Psoriasis    below the knee on both legs   . Wears glasses     Past Surgical History:  Procedure Laterality Date  . AORTIC VALVE REPLACEMENT  06/2017  . AORTIC VALVE REPLACEMENT N/A 09/17/2017   Procedure: REDO AORTIC VALVE REPLACEMENT (AVR);  Surgeon: [name], MD;  Location: [facility] OR;  Service: Open Heart Surgery;  Laterality: N/A;  . CARDIAC CATHETERIZATION    . HERNIA REPAIR  2016   left and right  . RIGHT/LEFT HEART CATH AND CORONARY ANGIOGRAPHY N/A 06/16/2017   Procedure: RIGHT/LEFT HEART CATH AND CORONARY ANGIOGRAPHY;  Surgeon: Lyn Records, MD;  [facility] INVASIVE [facility] LAB;  Service: Cardiovascular;  Laterality: N/A;  . TEE WITHOUT CARDIOVERSION N/A 07/12/2017   Procedure: TRANSESOPHAGEAL ECHOCARDIOGRAM (TEE);  Surgeon: [name], MD;  Location: [facility] OR;  Service: Open Heart Surgery;  Laterality: N/A;  . TEE WITHOUT CARDIOVERSION N/A 09/09/2017   Procedure: TRANSESOPHAGEAL ECHOCARDIOGRAM (TEE);  Surgeon: Thurmon Fair, MD;  Location: [facility] ENDOSCOPY;  Service:  Cardiovascular;  Laterality: N/A;  . TEE WITHOUT CARDIOVERSION N/A 09/17/2017   Procedure: TRANSESOPHAGEAL ECHOCARDIOGRAM (TEE);  Surgeon: [name], MD;  Location: [facility] OR;  Service: Open Heart Surgery;  Laterality: N/A;    FAMHx:  Family History  Problem Relation Age of Onset  . Diabetes Mother        also HTN  . CAD Father 46       CABGx3  . Dementia Father   . Liver cancer Brother   . Diabetes Brother     SOCHx:   reports that he has never smoked. He  has never used smokeless tobacco. He reports current alcohol use. He reports that he does not use drugs.  ALLERGIES:  Allergies  Allergen Reactions  . Ace Inhibitors Cough  . Codeine Itching and Rash    MEDS:  Current Meds  Medication Sig  . atorvastatin (LIPITOR) 20 MG tablet Take 20 mg by mouth daily.   . cholecalciferol (VITAMIN D) 1000 UNITS tablet Take 1,000 Units by mouth daily.  . COSENTYX SENSOREADY PEN 150 MG/ML SOAJ Inject 150 mg into the skin every 30 (thirty) days.  . fexofenadine (ALLEGRA) 60 MG tablet Take 60 mg by mouth daily.  . hydroxychloroquine (PLAQUENIL) 200 MG tablet Take 400 mg by mouth daily.   . metoprolol tartrate (LOPRESSOR) 50 MG tablet TAKE 1 TABLET TWICE A DAY  . Omega-3 Fatty Acids (FISH OIL PO) Take 360 mg by mouth daily.  . tamsulosin (FLOMAX) 0.4 MG CAPS capsule Take 0.4 mg by mouth daily after breakfast.      ROS: Pertinent items noted in HPI and remainder of comprehensive ROS otherwise negative.  Labs/Other Tests and Data Reviewed:    Recent Labs: 01/20/2018: Hemoglobin 11.0; Platelets 138   Recent Lipid Panel No results found for: CHOL, TRIG, HDL, CHOLHDL, LDLCALC, LDLDIRECT  Wt Readings from Last 3 Encounters:  12/24/17 170 lb 12.8 oz (77.5 kg)  10/20/17 169 lb 3.2 oz (76.7 kg)  10/19/17 162 lb (73.5 kg)     Exam:    Vital Signs:  There were no vitals taken for this visit.   Exam deferred due to telephone visit  ASSESSMENT & PLAN:    1. Severe symptomatic calcific aortic valve stenosis - s/p AVR with mild to moderate peri-valvular leak 2. Moderate aortic insufficiency 3. Dyslipidemia 4. Lupus/MCTD  Luis Ferguson is doing well now after his aortic valve replacement.  His aortic gradient is stable.  His energy level has improved.  His cholesterol has been well treated.  He should have a repeat CBC to monitor for improvement in his anemia.  Otherwise no medication changes today.  Follow-up with me annually or sooner as necessary.   COVID-19 Education: The signs and symptoms of COVID-19 were discussed with the patient and how to seek care for testing (follow up with PCP or arrange E-visit).  The importance of social distancing was discussed today.  Patient Risk:   After full review of this patients clinical status, I feel that they are at least moderate risk at this time.  Time:   Today, I have spent 25 minutes with the patient with telehealth technology discussing aortic valve replacement, dyslipidemia and anemia.     Medication Adjustments/Labs and Tests Ordered: Current medicines are reviewed at length with the patient today.  Concerns regarding medicines are outlined above.   Tests Ordered: Orders Placed This Encounter  Procedures  . CBC    Medication Changes: No orders of the defined types were placed  in this encounter.   Disposition:  in 1 year(s)  Chrystie Nose, MD, [facility], FACP  [place]  [facility]  Medical Director of the [facility] Lip[facility]  Cardiovascular Risk Reduction Clinic Diplomate of the American Board of Clinical Lipidology Attending Cardiologist  Direct Dial: [phone]  Fax: [phone]  Website:  [email or web address]  Chrystie Nose, MD  12/16/2018 4:50 PM

## 2018-12-16 NOTE — Telephone Encounter (Signed)
Patient called to review e-visit instructions. Patient aware that the following changes have been made: NONE Patient aware that they will need the following labs: CBC next week  Patient aware that they will need the following test(s): NONE  Recall for ONE YEAR entered.   LM with AVS details

## 2018-12-16 NOTE — Patient Instructions (Signed)
Medication Instructions:  Your physician recommends that you continue on your current medications as directed. Please refer to the Current Medication list given to you today.  If you need a refill on your cardiac medications before your next appointment, please call your pharmacy.   Lab work: CBC next week - non-fasting lab If you have labs (blood work) drawn today and your tests are completely normal, you will receive your results only by: [name] MyChart Message (if you have MyChart) OR . A paper copy in the mail If you have any lab test that is abnormal or we need to change your treatment, we will call you to review the results.  Testing/Procedures: NONE  Follow-Up: At [facility], you and your health needs are our priority.  As part of our continuing mission to provide you with exceptional heart care, we have created designated Provider Care Teams.  These Care Teams include your primary Cardiologist (physician) and Advanced Practice Providers (APPs -  Physician Assistants and Nurse Practitioners) who all work together to provide you with the care you need, when you need it. You will need a follow up appointment in 12 months.  Please call our office 2 months in advance to schedule this appointment.  You may see Chrystie Nose, MD or one of the following Advanced Practice Providers on your designated Care Team: [place], [place] . Micah Flesher, PA-C  Any Other Special Instructions Will Be Listed Below (If Applicable).

## 2018-12-22 LAB — CBC
Hematocrit: 40 % (ref 37.5–51.0)
Hemoglobin: 12.9 g/dL — ABNORMAL LOW (ref 13.0–17.7)
MCH: 29.3 pg (ref 26.6–33.0)
MCHC: 32.3 g/dL (ref 31.5–35.7)
MCV: 91 fL (ref 79–97)
Platelets: 172 10*3/uL (ref 150–450)
RBC: 4.4 x10E6/uL (ref 4.14–5.80)
RDW: 14.3 % (ref 11.6–15.4)
WBC: 3.5 10*3/uL (ref 3.4–10.8)

## 2019-01-16 ENCOUNTER — Other Ambulatory Visit: Payer: Self-pay | Admitting: Internal Medicine

## 2019-06-05 ENCOUNTER — Emergency Department (HOSPITAL_COMMUNITY)
Admission: EM | Admit: 2019-06-05 | Discharge: 2019-06-05 | Disposition: A | Discharge status: Home / Self Care | Payer: Commercial Managed Care - PPO | Attending: Emergency Medicine | Admitting: Emergency Medicine

## 2019-06-05 ENCOUNTER — Emergency Department (HOSPITAL_COMMUNITY): Payer: Commercial Managed Care - PPO

## 2019-06-05 ENCOUNTER — Encounter (HOSPITAL_COMMUNITY): Payer: Self-pay | Admitting: Emergency Medicine

## 2019-06-05 ENCOUNTER — Other Ambulatory Visit: Payer: Self-pay

## 2019-06-05 DIAGNOSIS — K219 Gastro-esophageal reflux disease without esophagitis: Secondary | ICD-10-CM | POA: Insufficient documentation

## 2019-06-05 DIAGNOSIS — K2900 Acute gastritis without bleeding: Secondary | ICD-10-CM

## 2019-06-05 DIAGNOSIS — E876 Hypokalemia: Secondary | ICD-10-CM | POA: Diagnosis not present

## 2019-06-05 DIAGNOSIS — I252 Old myocardial infarction: Secondary | ICD-10-CM | POA: Diagnosis not present

## 2019-06-05 DIAGNOSIS — Z79899 Other long term (current) drug therapy: Secondary | ICD-10-CM | POA: Insufficient documentation

## 2019-06-05 DIAGNOSIS — R1110 Vomiting, unspecified: Secondary | ICD-10-CM | POA: Diagnosis present

## 2019-06-05 LAB — URINALYSIS, ROUTINE W REFLEX MICROSCOPIC
Bacteria, UA: NONE SEEN
Bilirubin Urine: NEGATIVE
Glucose, UA: NEGATIVE mg/dL
Hgb urine dipstick: NEGATIVE
Ketones, ur: NEGATIVE mg/dL
Leukocytes,Ua: NEGATIVE
Nitrite: NEGATIVE
Protein, ur: 100 mg/dL — AB
Specific Gravity, Urine: 1.017 (ref 1.005–1.030)
pH: 6 (ref 5.0–8.0)

## 2019-06-05 LAB — COMPREHENSIVE METABOLIC PANEL
ALT: 47 U/L — ABNORMAL HIGH (ref 0–44)
AST: 37 U/L (ref 15–41)
Albumin: 4 g/dL (ref 3.5–5.0)
Alkaline Phosphatase: 64 U/L (ref 38–126)
Anion gap: 10 (ref 5–15)
BUN: 14 mg/dL (ref 6–20)
CO2: 28 mmol/L (ref 22–32)
Calcium: 8 mg/dL — ABNORMAL LOW (ref 8.9–10.3)
Chloride: 91 mmol/L — ABNORMAL LOW (ref 98–111)
Creatinine, Ser: 0.76 mg/dL (ref 0.61–1.24)
GFR calc Af Amer: >60 mL/min (ref >60)
GFR calc non Af Amer: >60 mL/min (ref >60)
Glucose, Bld: 101 mg/dL — ABNORMAL HIGH (ref 70–99)
Potassium: 3 mmol/L — ABNORMAL LOW (ref 3.5–5.1)
Sodium: 129 mmol/L — ABNORMAL LOW (ref 135–145)
Total Bilirubin: 1.3 mg/dL — ABNORMAL HIGH (ref 0.3–1.2)
Total Protein: 7.6 g/dL (ref 6.5–8.1)

## 2019-06-05 LAB — LIPASE, BLOOD: Lipase: 63 U/L — ABNORMAL HIGH (ref 11–51)

## 2019-06-05 LAB — TROPONIN I (HIGH SENSITIVITY)
Troponin I (High Sensitivity): 24 ng/L — ABNORMAL HIGH (ref <18)
Troponin I (High Sensitivity): 24 ng/L — ABNORMAL HIGH (ref <18)

## 2019-06-05 LAB — CBC
HCT: 43.7 % (ref 39.0–52.0)
Hemoglobin: 14.1 g/dL (ref 13.0–17.0)
MCH: 27.6 pg (ref 26.0–34.0)
MCHC: 32.3 g/dL (ref 30.0–36.0)
MCV: 85.7 fL (ref 80.0–100.0)
Platelets: 150 10*3/uL (ref 150–400)
RBC: 5.1 MIL/uL (ref 4.22–5.81)
RDW: 14.6 % (ref 11.5–15.5)
WBC: 3.7 10*3/uL — ABNORMAL LOW (ref 4.0–10.5)
nRBC: 0 % (ref 0.0–0.2)

## 2019-06-05 MED ORDER — LIDOCAINE VISCOUS HCL 2 % MT SOLN
15.0000 mL | Freq: Once | OROMUCOSAL | Status: AC
  Administered 2019-06-05: 15 mL via ORAL
  Filled 2019-06-05: qty 15

## 2019-06-05 MED ORDER — POTASSIUM CHLORIDE ER 10 MEQ PO TBCR: 10.0000 meq | EXTENDED_RELEASE_TABLET | Freq: Every day | ORAL | 0 refills | Status: DC

## 2019-06-05 MED ORDER — POTASSIUM CHLORIDE 10 MEQ/100ML IV SOLN
10.0000 meq | Freq: Once | INTRAVENOUS | Status: AC
  Administered 2019-06-05: 10 meq via INTRAVENOUS
  Filled 2019-06-05: qty 100

## 2019-06-05 MED ORDER — SODIUM CHLORIDE 0.9% FLUSH: 3.0000 mL | Freq: Once | INTRAVENOUS | Status: DC

## 2019-06-05 MED ORDER — SODIUM CHLORIDE 0.9 % IV BOLUS
1000.0000 mL | Freq: Once | INTRAVENOUS | Status: AC
  Administered 2019-06-05: 1000 mL via INTRAVENOUS

## 2019-06-05 MED ORDER — HYDRALAZINE HCL 20 MG/ML IJ SOLN
10.0000 mg | Freq: Once | INTRAMUSCULAR | Status: AC
  Administered 2019-06-05: 10 mg via INTRAVENOUS
  Filled 2019-06-05: qty 1

## 2019-06-05 MED ORDER — ALUM & MAG HYDROXIDE-SIMETH 200-200-20 MG/5ML PO SUSP
30.0000 mL | Freq: Once | ORAL | Status: AC
  Administered 2019-06-05: 30 mL via ORAL
  Filled 2019-06-05: qty 30

## 2019-06-05 MED ORDER — ONDANSETRON HCL 4 MG/2ML IJ SOLN
4.0000 mg | Freq: Once | INTRAMUSCULAR | Status: DC | PRN
  Filled 2019-06-05: qty 2

## 2019-06-05 MED ORDER — ONDANSETRON HCL 4 MG/2ML IJ SOLN
4.0000 mg | Freq: Once | INTRAMUSCULAR | Status: AC
  Administered 2019-06-05: 4 mg via INTRAVENOUS

## 2019-06-05 NOTE — ED Triage Notes (Signed)
Pt verbalizes n/v with "acid reflex" since this past Tuesday; denies CP; "but I did have two open heart surgeries."

## 2019-06-05 NOTE — Discharge Instructions (Signed)
You were found to have a mildly low potassium today at 3.0; we have given you some potassium in the ED. I have written for a few days of oral potassium as well - take as prescribed  Use Maalox as needed for comfort. Continue taking Omeprazole for acid reflux. You can take the Reglan (metaclopramide) as needed for nausea.   Attached is information on bland diet - try to slowly increase your diet for the next few days.   Follow up with your PCP. Return to the ED for any worsening symptoms including inability to keep liquids down, continuous vomiting, feeling like you could pass out, vomiting blood, fevers > 100.4

## 2019-06-05 NOTE — ED Provider Notes (Addendum)
[facility]-EMERGENCY DEPT Provider Note   CSN: [id number] Arrival date & time: 06/05/19  1208     History   Chief Complaint Chief Complaint  Patient presents with   Emesis    HPI Luis Ferguson is a 58 y.o. male with PMHx GERD, Lupus, s/p AVR who presents to the ED today complaining of nausea and nonbloody nonbilious emesis x 5 days.  Patient reports history of acid reflux and states that he sometimes has issues with it.  States that he has been unable to keep anything down for the past couple of days, prompting his ED visit today.  He was prescribed omeprazole as well as Reglan by his PCP when he called into the office earlier this week.  States no improvement with same.  Patient reports he has not had a bowel movement in 5 days given he thinks he has not eaten anything.  He is still passing gas.  States he has some soreness diffusely to his abdomen but attributes that more to vomiting excessively. He does report that he was hiccuping quite frequently earlier in his symptomology but that has improved; he is now coughing.  Is denying any chest pain.  He does report who open heart surgeries with history of aortic valve replacement. Denies fever, chills, shortness of breath, urinary symptoms, or any other associated symptoms. PSHx includes hernia repair on abdomen.        Past Medical History:  Diagnosis Date   Anemia    Aortic stenosis    Arthritis    achiness in shoulder , elbows   Connective tissue disease (HCC)    followed by Dr. Kathi Ludwig, GMA, pt. off Prednisone since mid 2017   Fatigue    Headache    Perivalvular leak of prosthetic heart valve    PONV (postoperative nausea and vomiting)    Psoriasis    below the knee on both legs    Wears glasses     Patient Active Problem List   Diagnosis Date Noted   ST elevation myocardial infarction (STEMI) of inferolateral wall (HCC) 09/21/2017   S/P AVR (aortic valve replacement) 09/17/2017    Paravalvular leak (prosthetic valve), initial encounter 09/17/2017   Perivalvular leak of prosthetic heart valve    Malnutrition of moderate degree 08/07/2017   Other acquired hemolytic anemias (HCC) 08/06/2017   Thrombocytopenia (HCC) 08/06/2017   Symptomatic anemia 08/05/2017   GERD (gastroesophageal reflux disease) 08/05/2017   Cough 07/30/2017   S/P AVR 07/12/2017   Aortic valve regurgitation 01/15/2017   Aortic stenosis 01/22/2015   Possible bicuspid aortic valve 01/22/2015   Dyslipidemia 01/22/2015   Lupus (HCC) 01/22/2015    Past Surgical History:  Procedure Laterality Date   AORTIC VALVE REPLACEMENT  06/2017   AORTIC VALVE REPLACEMENT N/A 09/17/2017   Procedure: REDO AORTIC VALVE REPLACEMENT (AVR);  Surgeon: [name], MD;  Location: [facility] OR;  Service: Open Heart Surgery;  Laterality: N/A;   CARDIAC CATHETERIZATION     HERNIA REPAIR  2016   left and right   RIGHT/LEFT HEART CATH AND CORONARY ANGIOGRAPHY N/A 06/16/2017   Procedure: RIGHT/LEFT HEART CATH AND CORONARY ANGIOGRAPHY;  Surgeon: Lyn Records, MD;  Location: [facility] INVASIVE CV LAB;  Service: Cardiovascular;  Laterality: N/A;   TEE WITHOUT CARDIOVERSION N/A 07/12/2017   Procedure: TRANSESOPHAGEAL ECHOCARDIOGRAM (TEE);  Surgeon: [name], MD;  Location: [facility] OR;  Service: Open Heart Surgery;  Laterality: N/A;   TEE WITHOUT CARDIOVERSION N/A 09/09/2017   Procedure: TRANSESOPHAGEAL  ECHOCARDIOGRAM (TEE);  Surgeon: Thurmon Fairroitoru, Mihai, MD;  [facility];  Service: Cardiovascular;  Laterality: N/A;   TEE WITHOUT CARDIOVERSION N/A 09/17/2017   Procedure: TRANSESOPHAGEAL ECHOCARDIOGRAM (TEE);  Surgeon: Loreli SlotHendrickson, Steven C, MD;  [facility];  Service: Open Heart Surgery;  Laterality: N/A;        Home Medications    Prior to Admission medications   Medication Sig Start Date End Date Taking? Authorizing Provider  atorvastatin (LIPITOR) 20 MG tablet Take 20 mg by mouth  daily.  11/25/12  Yes [provider]  cholecalciferol (VITAMIN D) 1000 UNITS tablet Take 1,000 Units by mouth daily.   Yes [provider]  COSENTYX SENSOREADY PEN 150 MG/ML SOAJ Inject 150 mg into the skin every 30 (thirty) days. 11/09/18  Yes [provider]  fexofenadine (ALLEGRA) 60 MG tablet Take 60 mg by mouth daily.   Yes [provider]  hydroxychloroquine (PLAQUENIL) 200 MG tablet Take 400 mg by mouth daily.  03/03/13  Yes [provider]  metoCLOPramide (REGLAN) 10 MG tablet Take 10 mg by mouth 2 (two) times daily. 06/01/19  Yes [provider]  metoprolol tartrate (LOPRESSOR) 50 MG tablet TAKE 1 TABLET TWICE A DAY Patient taking differently: Take 50 mg by mouth 2 (two) times daily.  01/16/19  Yes Hilty, Lisette AbuKenneth C, MD  Omega-3 Fatty Acids (FISH OIL PO) Take 360 mg by mouth daily.   Yes [provider]  omeprazole (PRILOSEC) 20 MG capsule Take 20 mg by mouth 2 (two) times daily. 06/01/19  Yes [provider]  tamsulosin (FLOMAX) 0.4 MG CAPS capsule Take 0.4 mg by mouth daily after breakfast.    Yes [provider]  potassium chloride (KLOR-CON) 10 MEQ tablet Take 1 tablet (10 mEq total) by mouth daily for 5 days. 06/05/19 06/10/19  Tanda RockersVenter, Laveah Gloster, PA-C    Family History Family History  Problem Relation Age of Onset   Diabetes Mother        also HTN   CAD Father [id number]       CABGx3   Dementia Father    Liver cancer Brother    Diabetes Brother     Social History Social History   Tobacco Use   Smoking status: Never Smoker   Smokeless tobacco: Never Used  Substance Use Topics   Alcohol use: Yes    Comment: rare   Drug use: No     Allergies   Ace inhibitors and Codeine   Review of Systems Review of Systems  Constitutional: Negative for chills and fever.  HENT: Negative for congestion.   Eyes: Negative for visual disturbance.  Respiratory: Positive for cough. Negative for shortness of  breath.   Cardiovascular: Negative for chest pain and leg swelling.  Gastrointestinal: Positive for abdominal pain, constipation, nausea and vomiting. Negative for blood in stool and diarrhea.  Genitourinary: Negative for difficulty urinating.  Musculoskeletal: Negative for myalgias.  Skin: Negative for rash.  Neurological: Negative for headaches.     Physical Exam Updated Vital Signs BP 139/89 (BP Location: Left Arm)    Pulse 92    Temp 98.6 F (37 C) (Oral)    Resp 16    SpO2 100%   Physical Exam Vitals signs and nursing note reviewed.  Constitutional:      Comments: Actively dry heaving  HENT:     Head: Normocephalic and atraumatic.  Eyes:     Conjunctiva/sclera: Conjunctivae normal.  Neck:     Musculoskeletal: Neck supple.  Cardiovascular:  Rate and Rhythm: Normal rate and regular rhythm.     Pulses: Normal pulses.  Pulmonary:     Effort: Pulmonary effort is normal.     Breath sounds: Normal breath sounds. No wheezing, rhonchi or rales.  Abdominal:     Palpations: Abdomen is soft.     Tenderness: There is abdominal tenderness. There is no right CVA tenderness, left CVA tenderness, guarding or rebound.     Comments: Soft, mild tenderness diffusely, +BS throughout, no r/g/r, neg murphy's, neg mcburney's, no CVA TTP   Skin:    General: Skin is warm and dry.  Neurological:     Mental Status: He is alert.      ED Treatments / Results  Labs (all labs ordered are listed, but only abnormal results are displayed) Labs Reviewed  LIPASE, BLOOD - Abnormal; Notable for the following components:      Result Value   Lipase 63 (*)    All other components within normal limits  COMPREHENSIVE METABOLIC PANEL - Abnormal; Notable for the following components:   Sodium 129 (*)    Potassium 3.0 (*)    Chloride 91 (*)    Glucose, Bld 101 (*)    Calcium 8.0 (*)    ALT 47 (*)    Total Bilirubin 1.3 (*)    All other components within normal limits  CBC - Abnormal; Notable for  the following components:   WBC 3.7 (*)    All other components within normal limits  URINALYSIS, ROUTINE W REFLEX MICROSCOPIC - Abnormal; Notable for the following components:   Protein, ur 100 (*)    All other components within normal limits  TROPONIN I (HIGH SENSITIVITY) - Abnormal; Notable for the following components:   Troponin I (High Sensitivity) 24 (*)    All other components within normal limits  TROPONIN I (HIGH SENSITIVITY) - Abnormal; Notable for the following components:   Troponin I (High Sensitivity) 24 (*)    All other components within normal limits    EKG EKG Interpretation  Date/Time:  Monday June 05 2019 19:08:25 EST Ventricular Rate:  107 PR Interval:    QRS Duration: 96 QT Interval:  363 QTC Calculation: 485 R Axis:   74 Text Interpretation: Sinus tachycardia Probable left atrial enlargement LVH with secondary repolarization abnormality Borderline prolonged QT interval Confirmed by Dene Gentry [phone]) on 06/05/2019 7:41:43 PM   Radiology Dg Abd Acute W/chest  Result Date: 06/05/2019 CLINICAL DATA:  Acid reflux, abdominal pain EXAM: DG ABDOMEN ACUTE W/ 1V CHEST COMPARISON:  10/19/2017 FINDINGS: There is no evidence of dilated bowel loops or free intraperitoneal air. No radiopaque calculi or other significant radiographic abnormality is seen. Extensive postoperative changes again noted to the sternum and mediastinum. Heart size and mediastinal contours are within normal limits. Both lungs are clear. Levo scoliotic curvature centered at L2. IMPRESSION: Negative abdominal radiographs.  No acute cardiopulmonary findings. Electronically Signed   By: Davina Poke M.D.   On: 06/05/2019 20:06    Procedures Procedures (including critical care time)  Medications Ordered in ED Medications  sodium chloride 0.9 % bolus 1,000 mL (0 mLs Intravenous Stopped 06/05/19 2051)  potassium chloride 10 mEq in 100 mL IVPB (0 mEq Intravenous Stopped 06/05/19 2032)    ondansetron (ZOFRAN) injection 4 mg (4 mg Intravenous Given 06/05/19 2015)  hydrALAZINE (APRESOLINE) injection 10 mg (10 mg Intravenous Given 06/05/19 2045)  alum & mag hydroxide-simeth (MAALOX/MYLANTA) 200-200-20 MG/5ML suspension 30 mL (30 mLs Oral Given 06/05/19 2112)  And  lidocaine (XYLOCAINE) 2 % viscous mouth solution 15 mL (15 mLs Oral Given 06/05/19 2112)     Initial Impression / Assessment and Plan / ED Course  I have reviewed the triage vital signs and the nursing notes.  Pertinent labs & imaging results that were available during my care of the patient were reviewed by me and considered in my medical decision making (see chart for details).    58 year old male presents to the ED today complaining of nausea and vomiting for the past 5 days.  Reports he ate some frozen teriyaki chicken before his symptoms started.  Also complaining of intermittent hiccuping.  States when he hiccups he feels like he cannot catch his breath which is the most concerning thing for him.  Patient was given prescription for omeprazole as well as metoclopramide for his hiccuping with mild relief.  Wife reports that has been better but states that patient is still nauseated and more dry heaving at this point.  It is worsening today.  Patient is afebrile without tachycardia or tachypnea.  He is actively dry heaving in the room when I enter.  He is no focal tenderness on his abdomen.  No peritoneal signs.  Will obtain baseline screening labs including CBC CMP lipase.  States he has not had a bowel movement in 5 days although he attributes this morning to not being able to keep anything down and has not been eating.  Will obtain acute abdominal series to ensure that there is no obstruction.  Give fluids and Zofran for comfort.  No Leukocytosis.  CMP with potassium 3.0.  Sodium 129.  Will replete.  Creatinine within normal limits.  No elevation in LFTs.  Troponins obtained given patient's history.  He is denying any  chest pain but given he states he feels short of breath we will order this.  Suspect he feels more short of breath from hiccuping.  Intermittently hiccups in the room but then it will cease on its own.   Initial troponin 24. Will repeat.  Patient's blood pressure has slowly been increasing during hospital stay.  Will give hydralazine.  Acute abdominal series without signs of obstruction.   Upon reevaluation patient resting comfortably although again intermittently hiccups.  Wife reports that he continues to more spit up at this point.  Patient has mildly prolonged QT interval on EKG.  Will hold off on QT prolonging medications at this time.  Attempt GI cocktail and reevaluate to see if this helps patient's discomfort.   Repeat troponin 24. Upon reevaluation blood pressure at 162/92. Pt reports he feels much improved after GI cocktail. Able to drink coke and keep it down; pt adequately fluid challenged. Have advise pt to pick up OTC maalox as this seemed to really help his symptoms. Advised to continue taking omeprazole as prescribed and reglan PRN. Pt advised to follow up with PCP. Strict return precautions have been discussed with pt. He is in agreement with plan at this time and stable for discharge home.   This note was prepared using Dragon voice recognition software and may include unintentional dictation errors due to the inherent limitations of voice recognition software.       Final Clinical Impressions(s) / ED Diagnoses   Final diagnoses:  Gastroesophageal reflux disease, unspecified whether esophagitis present  Acute gastritis without hemorrhage, unspecified gastritis type  Hypokalemia    ED Discharge Orders         Ordered    potassium chloride (KLOR-CON) 10 MEQ tablet  Daily     06/05/19 2157           Tanda Rockers, PA-C 06/05/19 2200    Tanda Rockers, PA-C 06/05/19 2201    Wynetta Fines, MD 06/05/19 [phone]

## 2019-06-05 NOTE — ED Notes (Signed)
Pt provided urinal and labeled specimen cup for U/A collection when possible. [employer]

## 2019-07-12 ENCOUNTER — Other Ambulatory Visit: Payer: Self-pay | Admitting: Gastroenterology

## 2019-07-12 DIAGNOSIS — R1084 Generalized abdominal pain: Secondary | ICD-10-CM

## 2019-07-12 DIAGNOSIS — R634 Abnormal weight loss: Secondary | ICD-10-CM

## 2019-07-13 ENCOUNTER — Ambulatory Visit
Admission: RE | Admit: 2019-07-13 | Discharge: 2019-07-13 | Disposition: A | Discharge status: Home / Self Care | Payer: Commercial Managed Care - PPO | Source: Ambulatory Visit | Attending: Gastroenterology | Admitting: Gastroenterology

## 2019-07-13 DIAGNOSIS — R634 Abnormal weight loss: Secondary | ICD-10-CM

## 2019-07-13 DIAGNOSIS — R1084 Generalized abdominal pain: Secondary | ICD-10-CM

## 2019-07-13 MED ORDER — IOPAMIDOL (ISOVUE-300) INJECTION 61%
100.0000 mL | Freq: Once | INTRAVENOUS | Status: AC | PRN
  Administered 2019-07-13: 100 mL via INTRAVENOUS

## 2019-07-14 ENCOUNTER — Other Ambulatory Visit: Payer: Self-pay | Admitting: Gastroenterology

## 2019-07-14 DIAGNOSIS — R9389 Abnormal findings on diagnostic imaging of other specified body structures: Secondary | ICD-10-CM

## 2019-07-26 ENCOUNTER — Ambulatory Visit
Admission: RE | Admit: 2019-07-26 | Discharge: 2019-07-26 | Disposition: A | Discharge status: Home / Self Care | Payer: Commercial Managed Care - PPO | Source: Ambulatory Visit | Attending: Gastroenterology | Admitting: Gastroenterology

## 2019-07-26 DIAGNOSIS — R9389 Abnormal findings on diagnostic imaging of other specified body structures: Secondary | ICD-10-CM

## 2019-12-04 ENCOUNTER — Telehealth (INDEPENDENT_AMBULATORY_CARE_PROVIDER_SITE_OTHER): Payer: Commercial Managed Care - PPO | Admitting: Internal Medicine

## 2019-12-04 ENCOUNTER — Telehealth: Payer: Self-pay | Admitting: Internal Medicine

## 2019-12-04 ENCOUNTER — Encounter: Payer: Self-pay | Admitting: Internal Medicine

## 2019-12-04 VITALS — BP 120/87 | Ht 66.0 in | Wt 155.0 lb

## 2019-12-04 DIAGNOSIS — E785 Hyperlipidemia, unspecified: Secondary | ICD-10-CM | POA: Diagnosis not present

## 2019-12-04 DIAGNOSIS — Z952 Presence of prosthetic heart valve: Secondary | ICD-10-CM | POA: Diagnosis not present

## 2019-12-04 DIAGNOSIS — K219 Gastro-esophageal reflux disease without esophagitis: Secondary | ICD-10-CM

## 2019-12-04 NOTE — Telephone Encounter (Signed)
  Patient Consent for Virtual Visit         Luis Ferguson has provided verbal consent on 12/04/2019 for a virtual visit (video or telephone).  Virtual visit completed 12/04/2019  CONSENT FOR VIRTUAL VISIT FOR:  Luis Ferguson  By participating in this virtual visit I agree to the following:  I hereby voluntarily request, consent and authorize [facility] and its employed or contracted physicians, physician assistants, nurse practitioners or other licensed health care professionals (the Practitioner), to provide me with telemedicine health care services (the "Services") as deemed necessary by the treating Practitioner. I acknowledge and consent to receive the Services by the Practitioner via telemedicine. I understand that the telemedicine visit will involve communicating with the Practitioner through live audiovisual communication technology and the disclosure of certain medical information by electronic transmission. I acknowledge that I have been given the opportunity to request an in-person assessment or other available alternative prior to the telemedicine visit and am voluntarily participating in the telemedicine visit.  I understand that I have the right to withhold or withdraw my consent to the use of telemedicine in the course of my care at any time, without affecting my right to future care or treatment, and that the Practitioner or I may terminate the telemedicine visit at any time. I understand that I have the right to inspect all information obtained and/or recorded in the course of the telemedicine visit and may receive copies of available information for a reasonable fee.  I understand that some of the potential risks of receiving the Services via telemedicine include:  [name] Delay or interruption in medical evaluation due to technological equipment failure or disruption; . Information transmitted may not be sufficient (e.g. poor resolution of images) to allow for appropriate medical  decision making by the Practitioner; and/or  . In rare instances, security protocols could fail, causing a breach of personal health information.  Furthermore, I acknowledge that it is my responsibility to provide information about my medical history, conditions and care that is complete and accurate to the best of my ability. I acknowledge that Practitioner's advice, recommendations, and/or decision may be based on factors not within their control, such as incomplete or inaccurate data provided by me or distortions of diagnostic images or specimens that may result from electronic transmissions. I understand that the practice of medicine is not an exact science and that Practitioner makes no warranties or guarantees regarding treatment outcomes. I acknowledge that a copy of this consent can be made available to me via my patient portal [facility] MyChart), or I can request a printed copy by calling the office of [facility].    I understand that my insurance will be billed for this visit.   I have read or had this consent read to me. . I understand the contents of this consent, which adequately explains the benefits and risks of the Services being provided via telemedicine.  . I have been provided ample opportunity to ask questions regarding this consent and the Services and have had my questions answered to my satisfaction. . I give my informed consent for the services to be provided through the use of telemedicine in my medical care

## 2019-12-04 NOTE — Patient Instructions (Signed)
Medication Instructions:  Your physician recommends that you continue on your current medications as directed. Please refer to the Current Medication list given to you today.  *If you need a refill on your cardiac medications before your next appointment, please call your pharmacy*    Follow-Up: At [facility], you and your health needs are our priority.  As part of our continuing mission to provide you with exceptional heart care, we have created designated Provider Care Teams.  These Care Teams include your primary Cardiologist (physician) and Advanced Practice Providers (APPs -  Physician Assistants and Nurse Practitioners) who all work together to provide you with the care you need, when you need it.  We recommend signing up for the patient portal called "MyChart".  Sign up information is provided on this After Visit Summary.  MyChart is used to connect with patients for Virtual Visits (Telemedicine).  Patients are able to view lab/test results, encounter notes, upcoming appointments, etc.  Non-urgent messages can be sent to your provider as well.   To learn more about what you can do with MyChart, go to [email or web address].    Your next appointment:   1 year(s)  The format for your next appointment:   In Person  Provider:   You may see Kenneth C Hilty, MD or one of the following Advanced Practice Providers on your designated Care Team:    Hao Meng, PA-C  Angela Duke, PA-C or   Krista Kroeger, PA-C    Other Instructions   

## 2019-12-04 NOTE — Progress Notes (Signed)
Virtual Visit via Telephone Note   This visit type was conducted due to national recommendations for restrictions regarding the COVID-19 Pandemic (e.g. social distancing) in an effort to limit this patient's exposure and mitigate transmission in our community.  Due to his co-morbid illnesses, this patient is at least at moderate risk for complications without adequate follow up.  This format is felt to be most appropriate for this patient at this time.  The patient did not have access to video technology/had technical difficulties with video requiring transitioning to audio format only (telephone).  All issues noted in this document were discussed and addressed.  No physical exam could be performed with this format.  Please refer to the patient's chart for his  consent to telehealth for [facility].   Evaluation Performed:  Telephone follow-up  Date:  12/04/2019   ID:  Luis Ferguson, DOB 03-04-1961, MRN [id number]  Patient Location:  [address] [place] [address]  Provider location:   [address], [address] [place], [place] [address]  PCP:  Milus Height, [place]  Cardiologist:  Chrystie Nose, MD Electrophysiologist:  None   Chief Complaint:  No complaints  History of Present Illness:    Luis Ferguson is a 59 y.o. male who presents via audio/video conferencing for a telehealth visit today.  Luis Ferguson is seen today in a phone follow-up.  He is done very well after his second surgery.  He denies any worsening shortness of breath or chest pain.  His energy level has improved.  He had a repeat echocardiogram last week which showed a low bioprosthetic aortic valve gradient with normal function and normal LVEF.  He has not had any repeat testing for anemia which is significant at the time however it is presumably improved.  I would recommend a CBC.  12/04/2019  Doing very well. No chest pain or shortness of breath. Had recent significant weight loss due to reflux, inability  to eat/vomiting. Now weight has stabilized. Exercising 3x a week. Echo in May of 2020 showed normal bioprosthetic aortic valve gradient. Recent lipids were as follows: TC 102, HDL 30, LDL 56, TG 79.   The patient does not have symptoms concerning for COVID-19 infection (fever, chills, cough, or new SHORTNESS OF BREATH).    Prior CV studies:   The following studies were reviewed today:  Echocardiogram  PMHx:  Past Medical History:  Diagnosis Date  . Anemia   . Aortic stenosis   . Arthritis    achiness in shoulder , elbows  . Connective tissue disease (HCC)    followed by Dr. Kathi Ludwig, GMA, pt. off Prednisone since mid 2017  . Fatigue   . Headache   . Perivalvular leak of prosthetic heart valve   . PONV (postoperative nausea and vomiting)   . Psoriasis    below the knee on both legs   . Wears glasses     Past Surgical History:  Procedure Laterality Date  . AORTIC VALVE REPLACEMENT  06/2017  . AORTIC VALVE REPLACEMENT N/A 09/17/2017   Procedure: REDO AORTIC VALVE REPLACEMENT (AVR);  Surgeon: [name], MD;  Location: [facility] OR;  Service: Open Heart Surgery;  Laterality: N/A;  . CARDIAC CATHETERIZATION    . HERNIA REPAIR  2016   left and right  . RIGHT/LEFT HEART CATH AND CORONARY ANGIOGRAPHY N/A 06/16/2017   Procedure: RIGHT/LEFT HEART CATH AND CORONARY ANGIOGRAPHY;  Surgeon: Lyn Records, MD;  Location: [facility] INVASIVE CV LAB;  Service: Cardiovascular;  Laterality: N/A;  . TEE WITHOUT CARDIOVERSION N/A 07/12/2017   Procedure: TRANSESOPHAGEAL ECHOCARDIOGRAM (TEE);  Surgeon: [name], MD;  Location[facility] OR;  Service: Open Heart Surgery;  Laterality: N/A;  . TEE WITHOUT CARDIOVERSION N/A 09/09/2017   Procedure: TRANSESOPHAGEAL ECHOCARDIOGRAM (TEE);  Surgeon: Thurmon Fair, MD;  Location: [facility] ENDOSCOPY;  Service: Cardiovascular;  Laterality: N/A;  . TEE WITHOUT CARDIOVERSION N/A 09/17/2017   Procedure: TRANSESOPHAGEAL ECHOCARDIOGRAM (TEE);  Surgeon: [name], MD;  Location: [facility] OR;  Service: Open Heart Surgery;  Laterality: N/A;    FAMHx:  Family History  Problem Relation Age of Onset  . Diabetes Mother        also HTN  . CAD Father 52       CABGx3  . Dementia Father   . Liver cancer Brother   . Diabetes Brother     SOCHx:   reports that he has never smoked. He has never used smokeless tobacco. He reports current alcohol use. He reports that he does not use drugs.  ALLERGIES:  Allergies  Allergen Reactions  . Ace Inhibitors Cough  . Codeine Itching and Rash    MEDS:  Current Meds  Medication Sig  . atorvastatin (LIPITOR) 20 MG tablet Take 20 mg by mouth daily.   . cholecalciferol (VITAMIN D) 1000 UNITS tablet Take 1,000 Units by mouth daily.  . COSENTYX SENSOREADY PEN 150 MG/ML SOAJ Inject 150 mg into the skin every 30 (thirty) days.  . fexofenadine (ALLEGRA) 60 MG tablet Take 60 mg by mouth daily.  . hydroxychloroquine (PLAQUENIL) 200 MG tablet Take 400 mg by mouth daily.   . metoprolol tartrate (LOPRESSOR) 50 MG tablet TAKE 1 TABLET TWICE A DAY (Patient taking differently: Take 50 mg by mouth 2 (two) times daily. )  . Omega-3 Fatty Acids (FISH OIL PO) Take 360 mg by mouth daily.  . tamsulosin (FLOMAX) 0.4 MG CAPS capsule Take 0.4 mg by mouth daily after breakfast.      ROS: Pertinent items noted in HPI and remainder of comprehensive ROS otherwise negative.  Labs/Other Tests and Data Reviewed:    Recent Labs: 06/05/2019: ALT 47; BUN 14; Creatinine, Ser 0.76; Hemoglobin 14.1; Platelets 150; Potassium 3.0; Sodium 129   Recent Lipid Panel No results found for: CHOL, TRIG, HDL, CHOLHDL, LDLCALC, LDLDIRECT  Wt Readings from Last 3 Encounters:  12/04/19 155 lb (70.3 kg)  12/24/17 170 lb 12.8 oz (77.5 kg)  10/20/17 169 lb 3.2 oz (76.7 kg)     Exam:    Vital Signs:  BP 120/87   Ht 5\' 6"  (1.676 m)   Wt 155 lb (70.3 kg)   BMI 25.02 kg/m    Exam deferred due to telephone visit  ASSESSMENT & PLAN:     1. Severe symptomatic calcific aortic valve stenosis - s/p AVR with mild to moderate peri-valvular leak 2. Moderate aortic insufficiency 3. Dyslipidemia 4. Lupus/MCTD 5. GERD  Mr. Angell continues to do well after AVR - BP is at goal, cholesterol is well-controlled, he denies any symptoms and is exercising regularly. Recent weight loss due to GERD, now controlled. No changes to medication today. Follow-up annually or sooner as necessary.  COVID-19 Education: The signs and symptoms of COVID-19 were discussed with the patient and how to seek care for testing (follow up with PCP or arrange E-visit).  The importance of social distancing was discussed today.  Patient Risk:   After full review of this patients clinical status, I feel that they are at  least moderate risk at this time.  Time:   Today, I have spent 25 minutes with the patient with telehealth technology discussing aortic valve replacement, dyslipidemia and anemia.     Medication Adjustments/Labs and Tests Ordered: Current medicines are reviewed at length with the patient today.  Concerns regarding medicines are outlined above.   Tests Ordered: No orders of the defined types were placed in this encounter.   Medication Changes: No orders of the defined types were placed in this encounter.   Disposition:  in 1 year(s)  Pixie Casino, MD, [facility], [place] Director of the Advanced Lipid Disorders &  Cardiovascular Risk Reduction Clinic Diplomate of the American Board of Clinical Lipidology Attending Cardiologist  Direct Dial: [phone]  Fax: [phone]  Website:  [email or web address]  Pixie Casino, MD  12/04/2019 8:08 AM

## 2019-12-19 ENCOUNTER — Other Ambulatory Visit: Payer: Self-pay | Admitting: Internal Medicine

## 2020-12-23 ENCOUNTER — Other Ambulatory Visit: Payer: Self-pay | Admitting: Internal Medicine

## 2021-01-10 ENCOUNTER — Other Ambulatory Visit: Payer: Self-pay | Admitting: Internal Medicine

## 2022-06-02 ENCOUNTER — Telehealth: Payer: Self-pay

## 2022-06-02 NOTE — Telephone Encounter (Signed)
   Pre-operative Risk Assessment    Patient Name: Luis Ferguson  DOB: 07/16/1961 MRN: [id number]     Request for Surgical Clearance    Procedure:  Dental Extraction - Amount of Teeth to be Pulled:  CLEANING,FILLINGS, CROWNS,BRIDGES, EXTRACTION  Date of Surgery:  Clearance TBD                                 Surgeon:  Cristal Ford DMD  Surgeon's Group or Practice Name:  [facility] Phone number:  [phone] Fax number:  [phone]   Type of Clearance Requested:   DOES PATIENT REQUIRE ANTIBIOTIC PROPHYLAXIS PRIOR TO DENTAL PROCEDURE?   Type of Anesthesia:  Local WITH EPINEPHRINE   Additional requests/questions:  Please fax a copy of CLEARANCE to the surgeon's office.  Signed, Jeanmarie Plant Raad Clayson  CCMA 06/02/2022, 2:45 PM

## 2022-06-02 NOTE — Telephone Encounter (Signed)
I will fax notes to DDS office to clarify only the procedure to be done at this time. We cannot provider blanket type clearance.   Example: If pt is having extractions we will need to know how many teeth being extracted (please not the # of the tooth in the mouth), also if simple or surgical extractions.

## 2022-06-02 NOTE — Telephone Encounter (Signed)
Preoperative team, please reach out to the requesting office and let them know that we do not provide blanket coverage.  Please gather details surrounding procedure to be performed.  At that time we will be able to provide recommendations from a cardiac standpoint.  Thank you for your help.  Jossie Ng. Dollene Mallery NP-C     06/02/2022, 2:58 PM [place] [address] Office [phone] Fax [phone]

## 2022-06-03 NOTE — Telephone Encounter (Signed)
Left a message today for the DDS office to please send a clearanc request only for the procedure to be done at this time, see notes from yesterday

## 2022-06-05 NOTE — Telephone Encounter (Signed)
I have tried x 2 by phone to reach the DDS and x 2 by fax to clarify procedure, see previous notes. WE cannot proceed with clearance until we know the procedure to be done at this time. Please fax a new clearance over to [phone] with the procedure to be done at this time.

## 2022-06-17 NOTE — Telephone Encounter (Signed)
Please see note below. Will remove from pre-op pool and wait for new clearance form with updated information to be faxed to our office.   Corrin Parker, PA-C 06/17/2022 8:35 AM

## 2023-11-12 ENCOUNTER — Ambulatory Visit: Admitting: Emergency Medicine

## 2023-12-24 ENCOUNTER — Ambulatory Visit: Admitting: Emergency Medicine

## 2023-12-24 NOTE — Progress Notes (Deleted)
  Cardiology Office Note:    Date:  12/24/2023  ID:  KEMP GOMES, DOB [birth year], MRN [id number] PCP: Diamond Formica, PA   [facility] Providers Cardiologist:  Hazle Lites, MD { Click to update primary MD,subspecialty MD or APP then REFRESH:1}    {Click to Open Review  :1}   Patient Profile:       Chief Complaint: *** History of Present Illness:  Luis Ferguson is a 63 y.o. male with visit-pertinent history of contact of tissue disorder and aortic stenosis s/p AVR  Cardiac catheterization 06/16/2017 showed normal coronaries.  Patient previously had minimally invasive aortic valve replacement by Dr. Luna Salinas on 07/12/2017 through a right many thoracotomy.  Unfortunately there was a small to moderate perivalvular leak noted after the chest closure.  Patient struggled with symptom of heart failure as well as symptomatic anemia afterward.  It was felt that this his hemolysis is related to perivalvular leak.  TTE performed 09/09/2017 showed severe perivalvular leak on the medial aspect of the prosthetic ring.  Normal LV function.  He eventually was brought back to the hospital to 2019 undergo redo aortic valve replacement via median sternotomy using 23 Inspiris Resilia pericardial aortic valve.  Hospital course complicated by elevation of troponin up to 1.15, there was also some inferior ST elevation which was felt to be due to pericarditis.  He was on steroids at the time for hemolytic anemia.  Echocardiogram on 08/2017 showed LVEF 55 to 60%, paradoxical septal wall motion, bioprosthetic aortic valve function normal, small pericardial effusion was identified posterior to the heart.  Most recent echocardiogram 12/09/2018 showed LVEF 55-60% with normal aortic valve prosthesis.  Patient was last seen via telemedicine on 12/04/2019 by Dr. Maximo Spar.  Patient noted they are doing well.  No chest pain or shortness of breath.  Patient has a follow-up with you.  Discussed the use of AI scribe  software for clinical note transcription with the patient, who gave verbal consent to proceed.  History of Present Illness     Review of systems:  Please see the history of present illness. All other systems are reviewed and otherwise negative. ***      Studies Reviewed:        ***  Risk Assessment/Calculations:   {Does this patient have ATRIAL FIBRILLATION?[phone]} No BP recorded.  {Refresh Note OR Click here to enter BP  :1}***        Physical Exam:   VS:  There were no vitals taken for this visit.   Wt Readings from Last 3 Encounters:  12/04/19 155 lb (70.3 kg)  12/24/17 170 lb 12.8 oz (77.5 kg)  10/20/17 169 lb 3.2 oz (76.7 kg)    GEN: Well nourished, well developed in no acute distress NECK: No JVD; No carotid bruits CARDIAC: ***RRR, no murmurs, rubs, gallops RESPIRATORY:  Clear to auscultation without rales, wheezing or rhonchi  ABDOMEN: Soft, non-tender, non-distended EXTREMITIES:  No edema; No acute deformity ***      Assessment and Plan:  Assessment and Plan Assessment & Plan      {Are you ordering a CV Procedure (e.g. stress test, cath, DCCV, TEE, etc)?   Press F2        :[id number]}  Dispo:  No follow-ups on file.  Signed, Ava Boatman, NP

## 2024-04-19 ENCOUNTER — Inpatient Hospital Stay (HOSPITAL_COMMUNITY)

## 2024-04-19 ENCOUNTER — Encounter (HOSPITAL_COMMUNITY): Payer: Self-pay

## 2024-04-19 ENCOUNTER — Emergency Department (HOSPITAL_COMMUNITY)

## 2024-04-19 ENCOUNTER — Other Ambulatory Visit: Payer: Self-pay

## 2024-04-19 ENCOUNTER — Inpatient Hospital Stay (HOSPITAL_COMMUNITY)
Admission: EM | Admit: 2024-04-19 | Discharge: 2024-04-26 | DRG: 853 | Disposition: E | Attending: Pulmonary Disease | Admitting: Pulmonary Disease

## 2024-04-19 DIAGNOSIS — R579 Shock, unspecified: Secondary | ICD-10-CM | POA: Diagnosis not present

## 2024-04-19 DIAGNOSIS — A419 Sepsis, unspecified organism: Secondary | ICD-10-CM | POA: Diagnosis not present

## 2024-04-19 DIAGNOSIS — N179 Acute kidney failure, unspecified: Secondary | ICD-10-CM | POA: Diagnosis present

## 2024-04-19 DIAGNOSIS — D649 Anemia, unspecified: Secondary | ICD-10-CM | POA: Diagnosis not present

## 2024-04-19 DIAGNOSIS — I129 Hypertensive chronic kidney disease with stage 1 through stage 4 chronic kidney disease, or unspecified chronic kidney disease: Secondary | ICD-10-CM | POA: Diagnosis present

## 2024-04-19 DIAGNOSIS — A4101 Sepsis due to Methicillin susceptible Staphylococcus aureus: Principal | ICD-10-CM | POA: Diagnosis present

## 2024-04-19 DIAGNOSIS — R7881 Bacteremia: Secondary | ICD-10-CM | POA: Insufficient documentation

## 2024-04-19 DIAGNOSIS — I700 Atherosclerosis of aorta: Secondary | ICD-10-CM | POA: Diagnosis present

## 2024-04-19 DIAGNOSIS — I330 Acute and subacute infective endocarditis: Secondary | ICD-10-CM | POA: Diagnosis present

## 2024-04-19 DIAGNOSIS — Z66 Do not resuscitate: Secondary | ICD-10-CM | POA: Diagnosis present

## 2024-04-19 DIAGNOSIS — B9561 Methicillin susceptible Staphylococcus aureus infection as the cause of diseases classified elsewhere: Secondary | ICD-10-CM | POA: Diagnosis not present

## 2024-04-19 DIAGNOSIS — D62 Acute posthemorrhagic anemia: Secondary | ICD-10-CM | POA: Diagnosis not present

## 2024-04-19 DIAGNOSIS — I110 Hypertensive heart disease with heart failure: Secondary | ICD-10-CM | POA: Diagnosis present

## 2024-04-19 DIAGNOSIS — N390 Urinary tract infection, site not specified: Secondary | ICD-10-CM | POA: Diagnosis present

## 2024-04-19 DIAGNOSIS — I63411 Cerebral infarction due to embolism of right middle cerebral artery: Secondary | ICD-10-CM | POA: Diagnosis not present

## 2024-04-19 DIAGNOSIS — G936 Cerebral edema: Secondary | ICD-10-CM | POA: Diagnosis present

## 2024-04-19 DIAGNOSIS — Z9889 Other specified postprocedural states: Secondary | ICD-10-CM | POA: Diagnosis not present

## 2024-04-19 DIAGNOSIS — E872 Acidosis, unspecified: Secondary | ICD-10-CM | POA: Diagnosis present

## 2024-04-19 DIAGNOSIS — G935 Compression of brain: Secondary | ICD-10-CM | POA: Diagnosis not present

## 2024-04-19 DIAGNOSIS — J9601 Acute respiratory failure with hypoxia: Secondary | ICD-10-CM | POA: Diagnosis present

## 2024-04-19 DIAGNOSIS — I743 Embolism and thrombosis of arteries of the lower extremities: Secondary | ICD-10-CM | POA: Diagnosis not present

## 2024-04-19 DIAGNOSIS — L4050 Arthropathic psoriasis, unspecified: Secondary | ICD-10-CM | POA: Diagnosis present

## 2024-04-19 DIAGNOSIS — Z818 Family history of other mental and behavioral disorders: Secondary | ICD-10-CM

## 2024-04-19 DIAGNOSIS — R29729 NIHSS score 29: Secondary | ICD-10-CM | POA: Diagnosis present

## 2024-04-19 DIAGNOSIS — I351 Nonrheumatic aortic (valve) insufficiency: Secondary | ICD-10-CM | POA: Diagnosis not present

## 2024-04-19 DIAGNOSIS — B9729 Other coronavirus as the cause of diseases classified elsewhere: Secondary | ICD-10-CM | POA: Diagnosis present

## 2024-04-19 DIAGNOSIS — K5900 Constipation, unspecified: Secondary | ICD-10-CM | POA: Diagnosis present

## 2024-04-19 DIAGNOSIS — K922 Gastrointestinal hemorrhage, unspecified: Secondary | ICD-10-CM | POA: Diagnosis not present

## 2024-04-19 DIAGNOSIS — T8203XD Leakage of heart valve prosthesis, subsequent encounter: Secondary | ICD-10-CM

## 2024-04-19 DIAGNOSIS — E860 Dehydration: Principal | ICD-10-CM | POA: Diagnosis present

## 2024-04-19 DIAGNOSIS — G9341 Metabolic encephalopathy: Secondary | ICD-10-CM | POA: Diagnosis present

## 2024-04-19 DIAGNOSIS — Z952 Presence of prosthetic heart valve: Secondary | ICD-10-CM

## 2024-04-19 DIAGNOSIS — D65 Disseminated intravascular coagulation [defibrination syndrome]: Secondary | ICD-10-CM | POA: Diagnosis present

## 2024-04-19 DIAGNOSIS — Z953 Presence of xenogenic heart valve: Secondary | ICD-10-CM

## 2024-04-19 DIAGNOSIS — T826XXA Infection and inflammatory reaction due to cardiac valve prosthesis, initial encounter: Secondary | ICD-10-CM | POA: Diagnosis not present

## 2024-04-19 DIAGNOSIS — M329 Systemic lupus erythematosus, unspecified: Secondary | ICD-10-CM | POA: Diagnosis present

## 2024-04-19 DIAGNOSIS — Z8249 Family history of ischemic heart disease and other diseases of the circulatory system: Secondary | ICD-10-CM

## 2024-04-19 DIAGNOSIS — E785 Hyperlipidemia, unspecified: Secondary | ICD-10-CM | POA: Diagnosis not present

## 2024-04-19 DIAGNOSIS — D696 Thrombocytopenia, unspecified: Secondary | ICD-10-CM | POA: Diagnosis not present

## 2024-04-19 DIAGNOSIS — D689 Coagulation defect, unspecified: Secondary | ICD-10-CM | POA: Diagnosis not present

## 2024-04-19 DIAGNOSIS — Z833 Family history of diabetes mellitus: Secondary | ICD-10-CM

## 2024-04-19 DIAGNOSIS — K2100 Gastro-esophageal reflux disease with esophagitis, without bleeding: Secondary | ICD-10-CM | POA: Diagnosis not present

## 2024-04-19 DIAGNOSIS — R1310 Dysphagia, unspecified: Secondary | ICD-10-CM | POA: Diagnosis present

## 2024-04-19 DIAGNOSIS — R531 Weakness: Secondary | ICD-10-CM | POA: Diagnosis present

## 2024-04-19 DIAGNOSIS — R570 Cardiogenic shock: Secondary | ICD-10-CM | POA: Diagnosis present

## 2024-04-19 DIAGNOSIS — E871 Hypo-osmolality and hyponatremia: Secondary | ICD-10-CM | POA: Diagnosis present

## 2024-04-19 DIAGNOSIS — Z885 Allergy status to narcotic agent status: Secondary | ICD-10-CM

## 2024-04-19 DIAGNOSIS — I2510 Atherosclerotic heart disease of native coronary artery without angina pectoris: Secondary | ICD-10-CM | POA: Diagnosis present

## 2024-04-19 DIAGNOSIS — I69391 Dysphagia following cerebral infarction: Secondary | ICD-10-CM | POA: Diagnosis not present

## 2024-04-19 DIAGNOSIS — F05 Delirium due to known physiological condition: Secondary | ICD-10-CM | POA: Diagnosis present

## 2024-04-19 DIAGNOSIS — I509 Heart failure, unspecified: Secondary | ICD-10-CM | POA: Diagnosis present

## 2024-04-19 DIAGNOSIS — Z79899 Other long term (current) drug therapy: Secondary | ICD-10-CM

## 2024-04-19 DIAGNOSIS — R6521 Severe sepsis with septic shock: Secondary | ICD-10-CM | POA: Diagnosis present

## 2024-04-19 DIAGNOSIS — B342 Coronavirus infection, unspecified: Secondary | ICD-10-CM | POA: Insufficient documentation

## 2024-04-19 DIAGNOSIS — F0390 Unspecified dementia without behavioral disturbance: Secondary | ICD-10-CM | POA: Diagnosis present

## 2024-04-19 DIAGNOSIS — Z515 Encounter for palliative care: Secondary | ICD-10-CM

## 2024-04-19 DIAGNOSIS — R578 Other shock: Secondary | ICD-10-CM | POA: Diagnosis not present

## 2024-04-19 DIAGNOSIS — Z9911 Dependence on respirator [ventilator] status: Secondary | ICD-10-CM | POA: Diagnosis not present

## 2024-04-19 DIAGNOSIS — N400 Enlarged prostate without lower urinary tract symptoms: Secondary | ICD-10-CM | POA: Diagnosis present

## 2024-04-19 DIAGNOSIS — J9600 Acute respiratory failure, unspecified whether with hypoxia or hypercapnia: Secondary | ICD-10-CM | POA: Diagnosis not present

## 2024-04-19 DIAGNOSIS — I352 Nonrheumatic aortic (valve) stenosis with insufficiency: Secondary | ICD-10-CM | POA: Diagnosis present

## 2024-04-19 DIAGNOSIS — J9690 Respiratory failure, unspecified, unspecified whether with hypoxia or hypercapnia: Secondary | ICD-10-CM | POA: Diagnosis not present

## 2024-04-19 DIAGNOSIS — I70222 Atherosclerosis of native arteries of extremities with rest pain, left leg: Secondary | ICD-10-CM | POA: Diagnosis present

## 2024-04-19 DIAGNOSIS — K219 Gastro-esophageal reflux disease without esophagitis: Secondary | ICD-10-CM | POA: Diagnosis present

## 2024-04-19 DIAGNOSIS — I70229 Atherosclerosis of native arteries of extremities with rest pain, unspecified extremity: Secondary | ICD-10-CM | POA: Diagnosis not present

## 2024-04-19 DIAGNOSIS — T8203XA Leakage of heart valve prosthesis, initial encounter: Secondary | ICD-10-CM | POA: Diagnosis present

## 2024-04-19 DIAGNOSIS — I38 Endocarditis, valve unspecified: Secondary | ICD-10-CM | POA: Diagnosis not present

## 2024-04-19 DIAGNOSIS — I639 Cerebral infarction, unspecified: Secondary | ICD-10-CM | POA: Diagnosis not present

## 2024-04-19 DIAGNOSIS — M79A22 Nontraumatic compartment syndrome of left lower extremity: Secondary | ICD-10-CM | POA: Diagnosis not present

## 2024-04-19 DIAGNOSIS — I350 Nonrheumatic aortic (valve) stenosis: Secondary | ICD-10-CM | POA: Diagnosis not present

## 2024-04-19 DIAGNOSIS — I63511 Cerebral infarction due to unspecified occlusion or stenosis of right middle cerebral artery: Secondary | ICD-10-CM | POA: Diagnosis not present

## 2024-04-19 DIAGNOSIS — Z800 Family history of malignant neoplasm of digestive organs: Secondary | ICD-10-CM

## 2024-04-19 DIAGNOSIS — I998 Other disorder of circulatory system: Secondary | ICD-10-CM | POA: Diagnosis not present

## 2024-04-19 HISTORY — DX: Systemic lupus erythematosus, unspecified: M32.9

## 2024-04-19 LAB — I-STAT CG4 LACTIC ACID, ED
Lactic Acid, Venous: 3.6 mmol/L (ref 0.5–1.9)
Lactic Acid, Venous: 3.4 mmol/L (ref 0.5–1.9)
Lactic Acid, Venous: 2.7 mmol/L (ref 0.5–1.9)

## 2024-04-19 LAB — CBC WITH DIFFERENTIAL/PLATELET
Band Neutrophils: 6 %
Basophils Absolute: 0 K/uL (ref 0.0–0.1)
Basophils Relative: 0 %
Eosinophils Absolute: 0 K/uL (ref 0.0–0.5)
Eosinophils Relative: 0 %
HCT: 32 % — ABNORMAL LOW (ref 39.0–52.0)
Hemoglobin: 10.8 g/dL — ABNORMAL LOW (ref 13.0–17.0)
Lymphocytes Relative: 2 %
Lymphs Abs: 0.2 K/uL — ABNORMAL LOW (ref 0.7–4.0)
MCH: 28.4 pg (ref 26.0–34.0)
MCHC: 33.8 g/dL (ref 30.0–36.0)
MCV: 84.2 fL (ref 80.0–100.0)
Monocytes Absolute: 0.2 K/uL (ref 0.1–1.0)
Monocytes Relative: 2 %
Neutro Abs: 8.8 K/uL — ABNORMAL HIGH (ref 1.7–7.7)
Neutrophils Relative %: 90 %
Platelets: 49 K/uL — ABNORMAL LOW (ref 150–400)
RBC: 3.8 MIL/uL — ABNORMAL LOW (ref 4.22–5.81)
RDW: 14.6 % (ref 11.5–15.5)
Smear Review: NORMAL
WBC: 9.2 K/uL (ref 4.0–10.5)
nRBC: 0 % (ref 0.0–0.2)

## 2024-04-19 LAB — I-STAT VENOUS BLOOD GAS, ED
Acid-base deficit: 6 mmol/L — ABNORMAL HIGH (ref 0.0–2.0)
Bicarbonate: 16.9 mmol/L — ABNORMAL LOW (ref 20.0–28.0)
Calcium, Ion: 1.02 mmol/L — ABNORMAL LOW (ref 1.15–1.40)
HCT: 29 % — ABNORMAL LOW (ref 39.0–52.0)
Hemoglobin: 9.9 g/dL — ABNORMAL LOW (ref 13.0–17.0)
O2 Saturation: 87 %
Potassium: 3.6 mmol/L (ref 3.5–5.1)
Sodium: 137 mmol/L (ref 135–145)
TCO2: 18 mmol/L — ABNORMAL LOW (ref 22–32)
pCO2, Ven: 24.7 mmHg — ABNORMAL LOW (ref 44–60)
pH, Ven: 7.444 — ABNORMAL HIGH (ref 7.25–7.43)
pO2, Ven: 49 mmHg — ABNORMAL HIGH (ref 32–45)

## 2024-04-19 LAB — COMPREHENSIVE METABOLIC PANEL WITH GFR
ALT: 98 U/L — ABNORMAL HIGH (ref 0–44)
AST: 160 U/L — ABNORMAL HIGH (ref 15–41)
Albumin: 2.5 g/dL — ABNORMAL LOW (ref 3.5–5.0)
Alkaline Phosphatase: 140 U/L — ABNORMAL HIGH (ref 38–126)
Anion gap: 16 — ABNORMAL HIGH (ref 5–15)
BUN: 95 mg/dL — ABNORMAL HIGH (ref 8–23)
CO2: 19 mmol/L — ABNORMAL LOW (ref 22–32)
Calcium: 8 mg/dL — ABNORMAL LOW (ref 8.9–10.3)
Chloride: 97 mmol/L — ABNORMAL LOW (ref 98–111)
Creatinine, Ser: 3.44 mg/dL — ABNORMAL HIGH (ref 0.61–1.24)
GFR, Estimated: 19 mL/min — ABNORMAL LOW (ref >60)
Glucose, Bld: 106 mg/dL — ABNORMAL HIGH (ref 70–99)
Potassium: 3.8 mmol/L (ref 3.5–5.1)
Sodium: 132 mmol/L — ABNORMAL LOW (ref 135–145)
Total Bilirubin: 1.2 mg/dL (ref 0.0–1.2)
Total Protein: 6.3 g/dL — ABNORMAL LOW (ref 6.5–8.1)

## 2024-04-19 LAB — CBG MONITORING, ED: Glucose-Capillary: 105 mg/dL — ABNORMAL HIGH (ref 70–99)

## 2024-04-19 LAB — LIPASE, BLOOD: Lipase: 19 U/L (ref 11–51)

## 2024-04-19 LAB — RESP PANEL BY RT-PCR (RSV, FLU A&B, COVID)  RVPGX2
Influenza A by PCR: NEGATIVE
Influenza B by PCR: NEGATIVE
Resp Syncytial Virus by PCR: NEGATIVE
SARS Coronavirus 2 by RT PCR: NEGATIVE

## 2024-04-19 MED ORDER — VANCOMYCIN HCL IN DEXTROSE 1-5 GM/200ML-% IV SOLN: 1000.0000 mg | Freq: Once | INTRAVENOUS | Status: DC

## 2024-04-19 MED ORDER — LACTATED RINGERS IV BOLUS (SEPSIS): 1000.0000 mL | Freq: Once | INTRAVENOUS | Status: DC

## 2024-04-19 MED ORDER — ACETAMINOPHEN 650 MG RE SUPP
650.0000 mg | Freq: Four times a day (QID) | RECTAL | Status: DC | PRN
  Filled 2024-04-19: qty 1

## 2024-04-19 MED ORDER — ATORVASTATIN CALCIUM 10 MG PO TABS
20.0000 mg | ORAL_TABLET | Freq: Every day | ORAL | Status: DC
Start: 2024-04-20 — End: 2024-04-20
  Filled 2024-04-19: qty 2

## 2024-04-19 MED ORDER — ACETAMINOPHEN 325 MG PO TABS: 650.0000 mg | ORAL_TABLET | Freq: Four times a day (QID) | ORAL | Status: DC | PRN

## 2024-04-19 MED ORDER — PHENYLEPHRINE 80 MCG/ML (10ML) SYRINGE FOR IV PUSH (FOR BLOOD PRESSURE SUPPORT)
PREFILLED_SYRINGE | INTRAVENOUS | Status: AC
  Administered 2024-04-19: 100 ug
  Filled 2024-04-19: qty 10

## 2024-04-19 MED ORDER — SODIUM CHLORIDE 0.9 % IV SOLN
2.0000 g | INTRAVENOUS | Status: DC
  Administered 2024-04-20: 2 g via INTRAVENOUS
  Filled 2024-04-19: qty 12.5

## 2024-04-19 MED ORDER — VANCOMYCIN VARIABLE DOSE PER UNSTABLE RENAL FUNCTION (PHARMACIST DOSING): Status: DC

## 2024-04-19 MED ORDER — SODIUM CHLORIDE 0.9 % IV BOLUS
1000.0000 mL | Freq: Once | INTRAVENOUS | Status: AC
  Administered 2024-04-19: 1000 mL via INTRAVENOUS

## 2024-04-19 MED ORDER — POLYETHYLENE GLYCOL 3350 17 G PO PACK: 17.0000 g | PACK | Freq: Every day | ORAL | Status: DC | PRN

## 2024-04-19 MED ORDER — SODIUM CHLORIDE 0.9% FLUSH
3.0000 mL | Freq: Two times a day (BID) | INTRAVENOUS | Status: DC
  Administered 2024-04-20 (×3): 3 mL via INTRAVENOUS

## 2024-04-19 MED ORDER — SODIUM CHLORIDE 0.9 % IV BOLUS: 500.0000 mL | Freq: Once | INTRAVENOUS | Status: DC

## 2024-04-19 MED ORDER — LACTATED RINGERS IV BOLUS (SEPSIS): 250.0000 mL | Freq: Once | INTRAVENOUS | Status: DC

## 2024-04-19 MED ORDER — SODIUM CHLORIDE 0.9 % IV SOLN: INTRAVENOUS | Status: DC

## 2024-04-19 MED ORDER — SUCCINYLCHOLINE CHLORIDE 20 MG/ML IJ SOLN
INTRAMUSCULAR | Status: AC | PRN
  Administered 2024-04-19: 100 mg via INTRAVENOUS

## 2024-04-19 MED ORDER — LACTATED RINGERS IV SOLN: INTRAVENOUS | Status: DC

## 2024-04-19 MED ORDER — ETOMIDATE 2 MG/ML IV SOLN
INTRAVENOUS | Status: AC | PRN
Start: 2024-04-19 — End: 2024-04-19
  Administered 2024-04-19: 20 mg via INTRAVENOUS

## 2024-04-19 MED ORDER — SODIUM CHLORIDE 0.9 % IV SOLN
2.0000 g | Freq: Once | INTRAVENOUS | Status: DC
  Filled 2024-04-19: qty 12.5

## 2024-04-19 MED ORDER — VANCOMYCIN HCL 1500 MG/300ML IV SOLN
1500.0000 mg | Freq: Once | INTRAVENOUS | Status: AC
  Administered 2024-04-20: 1500 mg via INTRAVENOUS
  Filled 2024-04-19: qty 300

## 2024-04-19 MED ORDER — METRONIDAZOLE 500 MG/100ML IV SOLN
500.0000 mg | Freq: Once | INTRAVENOUS | Status: AC
  Administered 2024-04-20: 500 mg via INTRAVENOUS
  Filled 2024-04-19: qty 100

## 2024-04-19 MED ORDER — TAMSULOSIN HCL 0.4 MG PO CAPS
0.4000 mg | ORAL_CAPSULE | Freq: Every day | ORAL | Status: DC
Start: 2024-04-20 — End: 2024-04-21
  Filled 2024-04-19: qty 1

## 2024-04-19 MED ORDER — ACETAMINOPHEN 650 MG RE SUPP
650.0000 mg | Freq: Once | RECTAL | Status: AC
  Administered 2024-04-19: 650 mg via RECTAL

## 2024-04-19 NOTE — Significant Event (Incomplete)
 Patient's nurse notified me that patient was getting more tachypneic.  On exam at bedside patient is minimally responsive.  Not following verbal commands.  I reviewed patient's chart medications notes.  Patient with known history of bioprosthetic aortic valve replacement, lupus/MCTD presented earlier to the ER with weakness and fatigue and was noted to have ongoing nausea vomiting cough congestion for the last 4 days.  Was found to be in acute renal failure with fever elevated transaminase suspected to be viral infection.  Blood pressure 97/71 pulse 112/min temperature 103.5 F respiration 48/min. HEENT anicteric no pallor. Chest bilateral air entry present.  Rhonchorous. Heart S1-S2 heard. Neuro patient is encephalopathic not following commands.  Pupils reacting to light.  Has nystagmus. Extremities - left foot appears cold.  Pulses not dopplerable on the left foot dorsalis pedis.   Assessment and plan -    stat VBG shows pH of 7.44 pCO2 of 24.7 pO2 of 49.  Lactic acid improved from 3.4-2.7.  I ordered stat blood cultures repeat metabolic panel CBC chest x-ray and once stable will get a CT head.  Acute encephalopathy with possible developing sepsis and respiratory failure and left foot appears cold.  Discussed with Dr. Lorette ER physician plan is to intubate patient.  Start empiric antibiotics.  Get CT head when stable.  I have consulted pulmonary critical care and also vascular surgeon Dr. Magda.  Unable to reach patient's wife.  Redia Cleaver MD

## 2024-04-19 NOTE — Progress Notes (Signed)
 Elink monitoring for the code sepsis protocol.

## 2024-04-19 NOTE — ED Notes (Signed)
 Patient transported to CT

## 2024-04-19 NOTE — ED Provider Notes (Signed)
 [facility] AT [facility] Provider Note   CSN: [id number] Arrival date & time: 04/19/24  1057     Patient presents with: Weakness   Luis Ferguson is a 63 y.o. male with PMHx HLD, lupus, GERD, CAD, dementia who presents to ED concerned for productive cough, congestion, nausea and vomiting x4 days. Symptoms are getting worse and patient now has generalized weakness. Endorses fever at home. Also endorses periumbilical abdominal pain which he gets with constipation - caregiver stating that patient had a BM yesterday although patient stating that it has been 4 days since last BM.  Family member stating that patient started Mucinex a couple days ago and the coughing is getting worse. Care giver also stating that patients sun-downing has also became far more severe as he is up all night coughing until he vomits. Care giver attempting to keep patient hydrated, but is having a hard time.  Denies chest pain, dyspnea, hematemesis, diarrhea, dysuria, hematuria, hematochezia.  Of note, EMS noting hypotension around 90/60 which did resolved with 500ml IV fluid bolus.      Weakness      Prior to Admission medications   Medication Sig Start Date End Date Taking? Authorizing Provider  atorvastatin  (LIPITOR) 20 MG tablet Take 20 mg by mouth daily.  11/25/12   [provider]  cholecalciferol  (VITAMIN D ) 1000 UNITS tablet Take 1,000 Units by mouth daily.    [provider]  COSENTYX SENSOREADY PEN 150 MG/ML SOAJ Inject 150 mg into the skin every 30 (thirty) days. 11/09/18   [provider]  fexofenadine (ALLEGRA) 60 MG tablet Take 60 mg by mouth daily.    [provider]  hydroxychloroquine  (PLAQUENIL ) 200 MG tablet Take 400 mg by mouth daily.  03/03/13   [provider]  metoprolol  tartrate (LOPRESSOR ) 50 MG tablet TAKE 1 TABLET TWICE A DAY (PLEASE SCHEDULE OFFICE VISIT FOR REFILLS) 01/10/21   Hilty, Vinie BROCKS, MD  Omega-3 Fatty  Acids (FISH OIL PO) Take 360 mg by mouth daily.    [provider]  tamsulosin  (FLOMAX ) 0.4 MG CAPS capsule Take 0.4 mg by mouth daily after breakfast.     [provider]    Allergies: Ace inhibitors and Codeine    Review of Systems  Neurological:  Positive for weakness.    Updated Vital Signs BP 127/82 (BP Location: Left Arm)   Pulse 97   Temp 99.8 F (37.7 C) (Axillary)   Resp 18   Ht 5' 7.2 (1.707 m)   SpO2 100%   BMI 24.13 kg/m   Physical Exam Vitals and nursing note reviewed.  Constitutional:      General: He is not in acute distress.    Appearance: He is not ill-appearing or toxic-appearing.  HENT:     Head: Normocephalic and atraumatic.     Mouth/Throat:     Mouth: Mucous membranes are moist.  Eyes:     General: No scleral icterus.       Right eye: No discharge.        Left eye: No discharge.     Conjunctiva/sclera: Conjunctivae normal.  Cardiovascular:     Rate and Rhythm: Normal rate and regular rhythm.     Pulses: Normal pulses.     Heart sounds: Normal heart sounds. No murmur heard. Pulmonary:     Effort: Pulmonary effort is normal. No respiratory distress.     Breath sounds: Normal breath sounds. No wheezing, rhonchi or rales.  Abdominal:  General: Abdomen is flat. Bowel sounds are normal. There is no distension.     Palpations: Abdomen is soft. There is no mass.     Tenderness: There is abdominal tenderness.  Musculoskeletal:     Right lower leg: No edema.     Left lower leg: No edema.  Skin:    General: Skin is warm and dry.     Findings: No rash.  Neurological:     General: No focal deficit present.     Mental Status: He is alert. Mental status is at baseline.     Comments: Patient answering questions appropriately, but caregiver at bedside stating that he is answering them incorrectly.  Psychiatric:        Mood and Affect: Mood normal.        Behavior: Behavior normal.     (all labs ordered are listed, but only  abnormal results are displayed) Labs Reviewed  COMPREHENSIVE METABOLIC PANEL WITH GFR - Abnormal; Notable for the following components:      Result Value   Sodium 132 (*)    Chloride 97 (*)    CO2 19 (*)    Glucose, Bld 106 (*)    BUN 95 (*)    Creatinine, Ser 3.44 (*)    Calcium  8.0 (*)    Total Protein 6.3 (*)    Albumin  2.5 (*)    AST 160 (*)    ALT 98 (*)    Alkaline Phosphatase 140 (*)    GFR, Estimated 19 (*)    Anion gap 16 (*)    All other components within normal limits  CBC WITH DIFFERENTIAL/PLATELET - Abnormal; Notable for the following components:   RBC 3.80 (*)    Hemoglobin 10.8 (*)    HCT 32.0 (*)    Platelets 49 (*)    All other components within normal limits  I-STAT CG4 LACTIC ACID, ED - Abnormal; Notable for the following components:   Lactic Acid, Venous 3.6 (*)    All other components within normal limits  I-STAT CG4 LACTIC ACID, ED - Abnormal; Notable for the following components:   Lactic Acid, Venous 3.4 (*)    All other components within normal limits  RESP PANEL BY RT-PCR (RSV, FLU A&B, COVID)  RVPGX2  LIPASE, BLOOD  URINALYSIS, ROUTINE W REFLEX MICROSCOPIC  HEPATITIS PANEL, ACUTE    EKG: None  Radiology: CT ABDOMEN PELVIS WO CONTRAST Result Date: 04/19/2024 CLINICAL DATA:  Cold symptoms and weakness 1 week.  Abdominal pain. EXAM: CT ABDOMEN AND PELVIS WITHOUT CONTRAST TECHNIQUE: Multidetector CT imaging of the abdomen and pelvis was performed following the standard protocol without IV contrast. RADIATION DOSE REDUCTION: This exam was performed according to the departmental dose-optimization program which includes automated exposure control, adjustment of the mA and/or kV according to patient size and/or use of iterative reconstruction technique. COMPARISON:  07/13/2019 FINDINGS: Lower chest: Heart is normal size. Subtle calcified plaque over the descending thoracic aorta. Possible small hiatal hernia unchanged. Median sternotomy wires are present.  Lung bases are clear. Hepatobiliary: Minimal cholelithiasis. Liver and biliary tree are otherwise unremarkable. Pancreas: Suggestion calcifications over the pancreas likely reflecting evidence of previous pancreatitis. No peripancreatic inflammation or fluid. Spleen: Normal. Adrenals/Urinary Tract: Adrenal glands are normal. Kidneys are normal size. Several left renal cysts unchanged. No follow-up imaging is recommended. No evidence of hydronephrosis or nephrolithiasis. Ureters are normal. Minimal bladder distension as the bladder is otherwise unremarkable. Stomach/Bowel: Small hiatal hernia as the stomach is otherwise unremarkable. Small bowel is  unremarkable. Appendix is not visualized. Very minimal diverticulosis of the colon which is otherwise unremarkable. Vascular/Lymphatic: Calcified plaque over the abdominal aorta which is otherwise normal in caliber. Remaining vascular structures are unremarkable. No adenopathy. Reproductive: Prostate is unremarkable. Other: Findings suggesting previous lower abdominal and right inguinal hernia repair. No free fluid or focal inflammatory change. Musculoskeletal: Significant degenerative changes of the spine with curvature of the lumbar spine convex left and multilevel disc disease throughout the lumbar spine. IMPRESSION: 1. No acute findings in the abdomen/pelvis. 2. Minimal cholelithiasis. 3. Small hiatal hernia. 4. Minimal colonic diverticulosis. 5. Left renal cysts.  No further imaging follow-up is recommended. 6. Aortic atherosclerosis. Aortic Atherosclerosis (ICD10-I70.0). Electronically Signed   By: Toribio Agreste M.D.   On: 04/19/2024 14:14   DG Chest 2 View Result Date: 04/19/2024 CLINICAL DATA:  Cough. EXAM: CHEST - 2 VIEW COMPARISON:  06/05/2019 and CT chest 06/30/2017. FINDINGS: Trachea is midline. Heart size normal. Lungs are low in volume with minimal streaky atelectasis. No airspace consolidation or pleural fluid. Degenerative changes in the shoulders. S  shaped scoliosis. IMPRESSION: Low lung volumes with minimal basilar atelectasis. Electronically Signed   By: Newell Eke M.D.   On: 04/19/2024 12:37     .Critical Care  Performed by: Hoy Nidia FALCON, PA-C Authorized by: Hoy Nidia FALCON, PA-C   Critical care provider statement:    Critical care time (minutes):  30   Critical care was necessary to treat or prevent imminent or life-threatening deterioration of the following conditions: hepatic and renal injury from dehydration.   Critical care was time spent personally by me on the following activities:  Development of treatment plan with patient or surrogate, discussions with consultants, evaluation of patient's response to treatment, examination of patient, ordering and review of laboratory studies, ordering and review of radiographic studies, ordering and performing treatments and interventions, pulse oximetry, re-evaluation of patient's condition and review of old charts    Medications Ordered in the ED  sodium chloride  0.9 % bolus 1,000 mL (1,000 mLs Intravenous New Bag/Given 04/19/24 1404)                                    Medical Decision Making Amount and/or Complexity of Data Reviewed Labs: ordered. Radiology: ordered.   This patient presents to the ED for concern of weakness, this involves an extensive number of treatment options, and is a complaint that carries with it a high risk of complications and morbidity.  The differential diagnosis includes Ischemic stroke, intracerebral hemorrhage, subarachnoid hemorrhage, Guillain-Barr syndrome, hypoglycemia, electrolyte abnormality, sepsis, ACS, carbon monoxide poisoning, anemia, dehydration.   Co morbidities that complicate the patient evaluation  HLD, lupus, GERD, CAD, dementia   Additional history obtained:  Dr. Alvera PCP   Problem List / ED Course / Critical interventions / Medication management  Patient presents to ED concern for cough, congestion,  nausea, vomiting over the past 4 days.  Caregiver at bedside stating that patient has been very dehydrated.  EMS noting hypotension which resolved with 500 mL IV fluids.  Patient also endorsing periumbilical abdominal pain and constipation, however caregiver stating that he had a bowel movement yesterday. Physical exam with mild periumbilical tenderness to palpation.  Rest of physical exam reassuring.  Patient afebrile with stable vitals. I Ordered, and personally interpreted labs.  Lactic acid elevated at 3.4.  CBC without leukocytosis.  There is anemia with hemoglobin at 10.8.  CMP  with hyponatremia at 132.  Chloride is also low at 97.  CO2 also low at 19.  There is also an AKI with BUN/creatinine elevated at 95/3.44.  Liver function test also all diffusely elevated.  Lipase and UA pending.  Respiratory panel negative. I ordered imaging studies including CT abdomen/pelvis and chest x-ray. I independently visualized and interpreted imaging which showed no acute process. I agree with the radiologist interpretation. It appears that patient's concerning lab workup is due to severe dehydration from recent URI with cough. Will start IV fluids and admit patient.  I requested consultation with the post on-call Dr. Seena,  and discussed lab and imaging findings as well as pertinent plan - they agree to admit patient. I have reviewed the patients home medicines and have made adjustments as needed   Social Determinants of Health:  none      Final diagnoses:  Dehydration  AKI (acute kidney injury)    ED Discharge Orders     None          Hoy Nidia JULIANNA DEVONNA 04/19/24 1522    Francesca Elsie CROME, MD 04/20/24 [phone]

## 2024-04-19 NOTE — H&P (Addendum)
 History and Physical   Luis Ferguson FMW:[id number] DOB: [birth year] DOA: 04/19/2024  PCP: Alvera Reagin, PA   Patient coming from: Home  Chief Complaint: Fatigue, weakness, recent illness  HPI: Luis Ferguson is a 63 y.o. male with medical history significant of hyperlipidemia, GERD, anemia, CKD, aortic valve regurgitation and stenosis status post replacement, psoriatic arthritis, lupus, BPH,**presenting with fatigue and weakness in the setting of recent illness.  History obtained with assistance of chart review and family.  Patient has had some ongoing nausea, vomiting, cough, congestion for 4 days.  This is gradually worsening and now patient is generally weak and fatigued.  Has had a fever at home.  Reports abdominal pain similar to prior constipation.  Patient has stated he felt his last bowel movement 4 days ago but caregiver notes that last BM was yesterday.  Caregiver reports increased sundowning while ill.  Patient is tried Mucinex without improvement.  EMS was called patient had blood pressure in the 90s and received 500 cc IV fluid.  Patient denies chills, chest pain, abdominal pain.  ED Course: Vital signs in the ED notable for blood pressure 90s-130 systolic, respirate in the 20s, saturating well on room air.  Lab workup included CMP with sodium 132, chloride 97, bicarb 19, gap 16, BUN 95, creatinine elevated to 3.4 from baseline of 0.8, glucose 106, calcium  8.0, protein 6.3, albumin  2.5, AST 160, ALT 98, alk phos 140.  CBC with hemoglobin 10.8 from unknown baseline, platelets 49.  Lactic acid 3.6 and 3.4 and repeat.  Lipase pending.  Respiratory panel for flu COVID RSV negative.  Urinalysis pending.  Chest x-ray showed low lung volumes and minimal atelectasis only.  CT abdomen pelvis showed no acute abnormality but did show gallstones, hiatal hernia, diverticulosis, renal cyst.  Patient received 1 L IV fluids in the ED  Review of Systems: As per HPI otherwise all other  systems reviewed and are negative.  Past Medical History:  Diagnosis Date   Anemia    Aortic stenosis    Arthritis    achiness in shoulder , elbows   Connective tissue disease    followed by Dr. Leni, [facility], pt. off Prednisone  since mid 2017   Fatigue    Headache    Lupus (systemic lupus erythematosus) (HCC)    Paravalvular leak (prosthetic valve), initial encounter 09/17/2017   Perivalvular leak of prosthetic heart valve    PONV (postoperative nausea and vomiting)    Psoriasis    below the knee on both legs    Wears glasses     Past Surgical History:  Procedure Laterality Date   AORTIC VALVE REPLACEMENT  06/2017   AORTIC VALVE REPLACEMENT N/A 09/17/2017   Procedure: REDO AORTIC VALVE REPLACEMENT (AVR);  Surgeon: Kerrin Elspeth BROCKS, MD;  Location: [facility] OR;  Service: Open Heart Surgery;  Laterality: N/A;   CARDIAC CATHETERIZATION     HERNIA REPAIR  2016   left and right   RIGHT/LEFT HEART CATH AND CORONARY ANGIOGRAPHY N/A 06/16/2017   Procedure: RIGHT/LEFT HEART CATH AND CORONARY ANGIOGRAPHY;  Surgeon: Claudene Victory ORN, MD;  Location: [facility] INVASIVE CV LAB;  Service: Cardiovascular;  Laterality: N/A;   TEE WITHOUT CARDIOVERSION N/A 07/12/2017   Procedure: TRANSESOPHAGEAL ECHOCARDIOGRAM (TEE);  Surgeon: Kerrin Elspeth BROCKS, MD;  Location: [facility] OR;  Service: Open Heart Surgery;  Laterality: N/A;   TEE WITHOUT CARDIOVERSION N/A 09/09/2017   Procedure: TRANSESOPHAGEAL ECHOCARDIOGRAM (TEE);  Surgeon: Francyne Headland, MD;  Location: [facility] ENDOSCOPY;  Service: Cardiovascular;  Laterality: N/A;  TEE WITHOUT CARDIOVERSION N/A 09/17/2017   Procedure: TRANSESOPHAGEAL ECHOCARDIOGRAM (TEE);  Surgeon: Kerrin Elspeth BROCKS, MD;  Location: [facility] OR;  Service: Open Heart Surgery;  Laterality: N/A;    Social History  reports that he has never smoked. He has never used smokeless tobacco. He reports current alcohol  use. He reports that he does not use drugs.  Allergies  Allergen Reactions   Ace Inhibitors  Cough   Codeine Itching and Rash    Family History  Problem Relation Age of Onset   Diabetes Mother        also HTN   CAD Father 11       CABGx3   Dementia Father    Liver cancer Brother    Diabetes Brother   Reviewed on admission  Prior to Admission medications   Medication Sig Start Date End Date Taking? Authorizing Provider  atorvastatin  (LIPITOR) 20 MG tablet Take 20 mg by mouth daily.  11/25/12   [provider]  cholecalciferol  (VITAMIN D ) 1000 UNITS tablet Take 1,000 Units by mouth daily.    [provider]  COSENTYX SENSOREADY PEN 150 MG/ML SOAJ Inject 150 mg into the skin every 30 (thirty) days. 11/09/18   [provider]  fexofenadine (ALLEGRA) 60 MG tablet Take 60 mg by mouth daily.    [provider]  hydroxychloroquine  (PLAQUENIL ) 200 MG tablet Take 400 mg by mouth daily.  03/03/13   [provider]  metoprolol  tartrate (LOPRESSOR ) 50 MG tablet TAKE 1 TABLET TWICE A DAY (PLEASE SCHEDULE OFFICE VISIT FOR REFILLS) 01/10/21   Hilty, Vinie BROCKS, MD  Omega-3 Fatty Acids (FISH OIL PO) Take 360 mg by mouth daily.    [provider]  tamsulosin  (FLOMAX ) 0.4 MG CAPS capsule Take 0.4 mg by mouth daily after breakfast.     [provider]    Physical Exam: Vitals:   04/19/24 1215 04/19/24 1315 04/19/24 1400 04/19/24 1504  BP: 123/88  98/72 127/82  Pulse:   78 97  Resp: (!) 27  (!) 27 18  Temp:    99.8 F (37.7 C)  TempSrc:    Axillary  SpO2:  100%  100%  Height:        Physical Exam Constitutional:      General: He is not in acute distress.    Appearance: He is ill-appearing.  HENT:     Head: Normocephalic and atraumatic.     Mouth/Throat:     Mouth: Mucous membranes are moist.     Pharynx: Oropharynx is clear.  Eyes:     Extraocular Movements: Extraocular movements intact.     Pupils: Pupils are equal, round, and reactive to light.  Cardiovascular:     Rate and Rhythm: Regular rhythm. Tachycardia present.      Pulses: Normal pulses.     Heart sounds: Normal heart sounds.  Pulmonary:     Effort: Pulmonary effort is normal. No respiratory distress.     Breath sounds: Normal breath sounds.  Abdominal:     General: Bowel sounds are normal. There is no distension.     Palpations: Abdomen is soft.     Tenderness: There is no abdominal tenderness.  Musculoskeletal:        General: No swelling or deformity.  Skin:    General: Skin is warm and dry.  Neurological:     General: No focal deficit present.     Comments: Generally weak, tired apearing    Labs on Admission: I have personally reviewed following  labs and imaging studies  CBC: Recent Labs  Lab 04/19/24 1219  WBC 9.2  NEUTROABS PENDING  HGB 10.8*  HCT 32.0*  MCV 84.2  PLT 49*    Basic Metabolic Panel: Recent Labs  Lab 04/19/24 1219  NA 132*  K 3.8  CL 97*  CO2 19*  GLUCOSE 106*  BUN 95*  CREATININE 3.44*  CALCIUM  8.0*    GFR: CrCl cannot be calculated (Unknown ideal weight.).  Liver Function Tests: Recent Labs  Lab 04/19/24 1219  AST 160*  ALT 98*  ALKPHOS 140*  BILITOT 1.2  PROT 6.3*  ALBUMIN  2.5*    Urine analysis:    Component Value Date/Time   COLORURINE YELLOW 06/05/2019 1237   APPEARANCEUR CLEAR 06/05/2019 1237   LABSPEC 1.017 06/05/2019 1237   PHURINE 6.0 06/05/2019 1237   GLUCOSEU NEGATIVE 06/05/2019 1237   HGBUR NEGATIVE 06/05/2019 1237   BILIRUBINUR NEGATIVE 06/05/2019 1237   KETONESUR NEGATIVE 06/05/2019 1237   PROTEINUR 100 (A) 06/05/2019 1237   NITRITE NEGATIVE 06/05/2019 1237   LEUKOCYTESUR NEGATIVE 06/05/2019 1237    Radiological Exams on Admission: CT ABDOMEN PELVIS WO CONTRAST Result Date: 04/19/2024 CLINICAL DATA:  Cold symptoms and weakness 1 week.  Abdominal pain. EXAM: CT ABDOMEN AND PELVIS WITHOUT CONTRAST TECHNIQUE: Multidetector CT imaging of the abdomen and pelvis was performed following the standard protocol without IV contrast. RADIATION DOSE REDUCTION: This exam  was performed according to the departmental dose-optimization program which includes automated exposure control, adjustment of the mA and/or kV according to patient size and/or use of iterative reconstruction technique. COMPARISON:  07/13/2019 FINDINGS: Lower chest: Heart is normal size. Subtle calcified plaque over the descending thoracic aorta. Possible small hiatal hernia unchanged. Median sternotomy wires are present. Lung bases are clear. Hepatobiliary: Minimal cholelithiasis. Liver and biliary tree are otherwise unremarkable. Pancreas: Suggestion calcifications over the pancreas likely reflecting evidence of previous pancreatitis. No peripancreatic inflammation or fluid. Spleen: Normal. Adrenals/Urinary Tract: Adrenal glands are normal. Kidneys are normal size. Several left renal cysts unchanged. No follow-up imaging is recommended. No evidence of hydronephrosis or nephrolithiasis. Ureters are normal. Minimal bladder distension as the bladder is otherwise unremarkable. Stomach/Bowel: Small hiatal hernia as the stomach is otherwise unremarkable. Small bowel is unremarkable. Appendix is not visualized. Very minimal diverticulosis of the colon which is otherwise unremarkable. Vascular/Lymphatic: Calcified plaque over the abdominal aorta which is otherwise normal in caliber. Remaining vascular structures are unremarkable. No adenopathy. Reproductive: Prostate is unremarkable. Other: Findings suggesting previous lower abdominal and right inguinal hernia repair. No free fluid or focal inflammatory change. Musculoskeletal: Significant degenerative changes of the spine with curvature of the lumbar spine convex left and multilevel disc disease throughout the lumbar spine. IMPRESSION: 1. No acute findings in the abdomen/pelvis. 2. Minimal cholelithiasis. 3. Small hiatal hernia. 4. Minimal colonic diverticulosis. 5. Left renal cysts.  No further imaging follow-up is recommended. 6. Aortic atherosclerosis. Aortic  Atherosclerosis (ICD10-I70.0). Electronically Signed   By: Toribio Agreste M.D.   On: 04/19/2024 14:14   DG Chest 2 View Result Date: 04/19/2024 CLINICAL DATA:  Cough. EXAM: CHEST - 2 VIEW COMPARISON:  06/05/2019 and CT chest 06/30/2017. FINDINGS: Trachea is midline. Heart size normal. Lungs are low in volume with minimal streaky atelectasis. No airspace consolidation or pleural fluid. Degenerative changes in the shoulders. S shaped scoliosis. IMPRESSION: Low lung volumes with minimal basilar atelectasis. Electronically Signed   By: Newell Eke M.D.   On: 04/19/2024 12:37   EKG: Independently reviewed.  Sinus rhythm at 99  bpm.  Nonspecific T wave changes with evidence of right bundle blanch block.  Bundle branch block is new from previous.  Possible ST changes.  Assessment/Plan Active Problems:   Aortic stenosis   Dyslipidemia   Lupus (systemic lupus erythematosus) (HCC)   Aortic valve regurgitation   S/P AVR   Symptomatic anemia   GERD (gastroesophageal reflux disease)   Perivalvular leak of prosthetic heart valve   Benign prostatic hyperplasia without urinary obstruction   Chronic kidney disease due to hypertension   AKI  ?CKD > The setting of weakness and illness with nausea vomiting and decreased p.o. intake.  Creatinine elevated to 3.4 from baseline 0.8. (History of CKD listed but do not see significant elevation in creatinine in recent past.) > Lactic acid 3.6, 3.4. > Received 1 L IV fluids in the ED and 500 cc by EMS. > Blood pressure improving in the ED from the 90s systolic now in the 120s. - Monitor on telemetry overnight - Continue with IV fluids - Trend renal function and electrolytes - Trend lactic acid  Fever Transaminitis Suspected viral illness > 4 days of nausea, vomiting, cough, congestion.  Now generally weak. > No leukocytosis.  Found to have mild LFT elevations. > Brown sputum, non-diagnostic xray, will check CT chest. > Negative flu COVID and RSV. - Check  full respiratory viral panel, PEP hepatitis panel, CMV - CT Chest WO - Supportive care  Abnormal EKG > New bundle branch block.  Questionable ST changes. - Check troponin  Sundowning > Reported by caregiver, worse with illness - Delirium precautions  Anemia Thrombocytopenia > Hemoglobin 10.8 from baseline.  Platelets 49.  On Plaquenil , but also in the setting of acute illness as above. - Hold Plaquenil  - Trend CBC  Lupus Psoriatic arthritis - Receives Cosentyx outpatient - Holding Plaquenil   BPH - Continue home tamsulosin   Aortic valve stenosis Aortic valve regurgitation > S/P AVR, history of perivalvular leak. - Noted  Hyperlipidemia - Continue home atorvastatin   DVT prophylaxis: SCDs given thrombocytopenia Code Status:   Full Family Communication:  Wife updated at bedside  Disposition Plan:   Patient is from:  Home  Anticipated DC to:  Home  Anticipated DC date:  2 to 4 days  Anticipated DC barriers: None  Consults called:  None Admission status:  Inpatient, telemetry  Severity of Illness: The appropriate patient status for this patient is INPATIENT. Inpatient status is judged to be reasonable and necessary in order to provide the required intensity of service to ensure the patient's safety. [name] presenting symptoms, physical exam findings, and initial radiographic and laboratory data in the context of their chronic comorbidities is felt to place them at high risk for further clinical deterioration. Furthermore, it is not anticipated that the patient will be medically stable for discharge from the hospital within 2 midnights of admission.   * I certify that at the point of admission it is my clinical judgment that the patient will require inpatient hospital care spanning beyond 2 midnights from the point of admission due to high intensity of service, high risk for further deterioration and high frequency of surveillance required.Luis Marsa KATHEE Seena  MD [facility]  How to contact the [facility] Attending or Consulting provider 7A - 7P or covering provider during after hours 7P -7A, for this patient?   Check the care team in [facility] and look for a) attending/consulting [facility] provider listed and b) the [facility] team listed Log into [email or web address] and use [place]'s universal  password to access. If you do not have the password, please contact the hospital operator. Locate the [facility] provider you are looking for under [facility] and page to a number that you can be directly reached. If you still have difficulty reaching the provider, please page the [facility] (Director on Call) for the Hospitalists listed on amion for assistance.  04/19/2024, 3:09 PM

## 2024-04-19 NOTE — ED Triage Notes (Signed)
 BIB EMS from home r/t worsening cold symptoms and weakness x1 weak. Cough, congestion, N/V. Denies pain. Hx Lupus. For EMS patient BP 90/60 and EMS administered 500ml LR with improved BP. A&Ox4.

## 2024-04-19 NOTE — Progress Notes (Signed)
 Pharmacy Antibiotic Note  Luis Ferguson is a 63 y.o. male admitted on 04/19/2024 with fatigue/weakness.  Pharmacy has been consulted for cefepime /vancomycin  dosing for sepsis concerns of unknown source.  -Blood cultures collected -No antibiotics given -WBC WNL, Tmax 103.5, sCr 3.44   Plan: -Cefepime  2g IV every 24 hours -Vancomycin  1500mg  IV x1 -Vancomycin  variable dosing given AKI -Monitor renal function for dose adjustments  -Follow up signs of clinical improvement, LOT, de-escalation of antibiotics   Height: 5' 7.2 (170.7 cm) IBW/kg (Calculated) : 66.56  Temp (24hrs), Avg:100.5 F (38.1 C), Min:98.4 F (36.9 C), Max:103.5 F (39.7 C)  Recent Labs  Lab 04/19/24 1219 04/19/24 1246 04/19/24 1411 04/19/24 2328  WBC 9.2  --   --   --   CREATININE 3.44*  --   --   --   LATICACIDVEN  --  3.6* 3.4* 2.7*    CrCl cannot be calculated (Unknown ideal weight.).    Allergies  Allergen Reactions   Ace Inhibitors Cough   Codeine Itching and Rash    Antimicrobials this admission: Cefepime  9/25 >>  Vancomycin  9/25 >>   Microbiology results: 9/24 BCx:   Thank you for allowing pharmacy to be a part of this patient's care.  Lynwood Poplar, PharmD, BCPS Clinical Pharmacist 04/19/2024 11:50 PM

## 2024-04-20 ENCOUNTER — Inpatient Hospital Stay (HOSPITAL_COMMUNITY)

## 2024-04-20 ENCOUNTER — Inpatient Hospital Stay (HOSPITAL_COMMUNITY): Admitting: Certified Registered Nurse Anesthetist

## 2024-04-20 ENCOUNTER — Encounter (HOSPITAL_COMMUNITY): Admission: EM | Disposition: E | Payer: Self-pay | Source: Home / Self Care | Attending: Pulmonary Disease

## 2024-04-20 ENCOUNTER — Encounter (HOSPITAL_COMMUNITY): Payer: Self-pay

## 2024-04-20 ENCOUNTER — Other Ambulatory Visit: Payer: Self-pay

## 2024-04-20 DIAGNOSIS — I639 Cerebral infarction, unspecified: Secondary | ICD-10-CM

## 2024-04-20 DIAGNOSIS — B342 Coronavirus infection, unspecified: Secondary | ICD-10-CM | POA: Insufficient documentation

## 2024-04-20 DIAGNOSIS — B9561 Methicillin susceptible Staphylococcus aureus infection as the cause of diseases classified elsewhere: Secondary | ICD-10-CM

## 2024-04-20 DIAGNOSIS — M79A22 Nontraumatic compartment syndrome of left lower extremity: Secondary | ICD-10-CM | POA: Diagnosis not present

## 2024-04-20 DIAGNOSIS — I351 Nonrheumatic aortic (valve) insufficiency: Secondary | ICD-10-CM | POA: Diagnosis not present

## 2024-04-20 DIAGNOSIS — I743 Embolism and thrombosis of arteries of the lower extremities: Secondary | ICD-10-CM | POA: Diagnosis not present

## 2024-04-20 DIAGNOSIS — J9600 Acute respiratory failure, unspecified whether with hypoxia or hypercapnia: Secondary | ICD-10-CM

## 2024-04-20 DIAGNOSIS — R578 Other shock: Secondary | ICD-10-CM

## 2024-04-20 DIAGNOSIS — E785 Hyperlipidemia, unspecified: Secondary | ICD-10-CM

## 2024-04-20 DIAGNOSIS — N179 Acute kidney failure, unspecified: Secondary | ICD-10-CM | POA: Diagnosis not present

## 2024-04-20 DIAGNOSIS — I998 Other disorder of circulatory system: Secondary | ICD-10-CM | POA: Diagnosis not present

## 2024-04-20 DIAGNOSIS — J9601 Acute respiratory failure with hypoxia: Secondary | ICD-10-CM

## 2024-04-20 DIAGNOSIS — Z9911 Dependence on respirator [ventilator] status: Secondary | ICD-10-CM

## 2024-04-20 DIAGNOSIS — I38 Endocarditis, valve unspecified: Secondary | ICD-10-CM | POA: Diagnosis not present

## 2024-04-20 DIAGNOSIS — N390 Urinary tract infection, site not specified: Secondary | ICD-10-CM

## 2024-04-20 DIAGNOSIS — D65 Disseminated intravascular coagulation [defibrination syndrome]: Secondary | ICD-10-CM

## 2024-04-20 DIAGNOSIS — Z9889 Other specified postprocedural states: Secondary | ICD-10-CM

## 2024-04-20 DIAGNOSIS — D689 Coagulation defect, unspecified: Secondary | ICD-10-CM

## 2024-04-20 DIAGNOSIS — I70229 Atherosclerosis of native arteries of extremities with rest pain, unspecified extremity: Secondary | ICD-10-CM

## 2024-04-20 DIAGNOSIS — J9690 Respiratory failure, unspecified, unspecified whether with hypoxia or hypercapnia: Secondary | ICD-10-CM

## 2024-04-20 DIAGNOSIS — I63511 Cerebral infarction due to unspecified occlusion or stenosis of right middle cerebral artery: Secondary | ICD-10-CM

## 2024-04-20 DIAGNOSIS — R579 Shock, unspecified: Secondary | ICD-10-CM

## 2024-04-20 DIAGNOSIS — R7881 Bacteremia: Secondary | ICD-10-CM | POA: Diagnosis not present

## 2024-04-20 DIAGNOSIS — R29729 NIHSS score 29: Secondary | ICD-10-CM

## 2024-04-20 DIAGNOSIS — A419 Sepsis, unspecified organism: Secondary | ICD-10-CM | POA: Diagnosis not present

## 2024-04-20 HISTORY — PX: FASCIOTOMY: SHX132

## 2024-04-20 HISTORY — PX: THROMBECTOMY FEMORAL ARTERY: SHX6406

## 2024-04-20 LAB — DIC (DISSEMINATED INTRAVASCULAR COAGULATION)PANEL
D-Dimer, Quant: >20 ug{FEU}/mL — ABNORMAL HIGH (ref 0.00–0.50)
Fibrinogen: 126 mg/dL — ABNORMAL LOW (ref 210–475)
INR: 1.8 — ABNORMAL HIGH (ref 0.8–1.2)
Platelets: 39 K/uL — ABNORMAL LOW (ref 150–400)
Prothrombin Time: 21.7 s — ABNORMAL HIGH (ref 11.4–15.2)
Smear Review: NONE SEEN
aPTT: 167 s (ref 24–36)

## 2024-04-20 LAB — CBC WITH DIFFERENTIAL/PLATELET
Abs Immature Granulocytes: 0.01 K/uL (ref 0.00–0.07)
Abs Immature Granulocytes: 0.03 K/uL (ref 0.00–0.07)
Basophils Absolute: 0 K/uL (ref 0.0–0.1)
Basophils Absolute: 0 K/uL (ref 0.0–0.1)
Basophils Relative: 0 %
Basophils Relative: 0 %
Eosinophils Absolute: 0 K/uL (ref 0.0–0.5)
Eosinophils Absolute: 0 K/uL (ref 0.0–0.5)
Eosinophils Relative: 0 %
Eosinophils Relative: 0 %
HCT: 25.3 % — ABNORMAL LOW (ref 39.0–52.0)
HCT: 26 % — ABNORMAL LOW (ref 39.0–52.0)
Hemoglobin: 8.7 g/dL — ABNORMAL LOW (ref 13.0–17.0)
Hemoglobin: 8.8 g/dL — ABNORMAL LOW (ref 13.0–17.0)
Immature Granulocytes: 0 %
Immature Granulocytes: 0 %
Lymphocytes Relative: 8 %
Lymphocytes Relative: 8 %
Lymphs Abs: 0.6 K/uL — ABNORMAL LOW (ref 0.7–4.0)
Lymphs Abs: 0.8 K/uL (ref 0.7–4.0)
MCH: 28.7 pg (ref 26.0–34.0)
MCH: 28.7 pg (ref 26.0–34.0)
MCHC: 33.8 g/dL (ref 30.0–36.0)
MCHC: 34.4 g/dL (ref 30.0–36.0)
MCV: 83.5 fL (ref 80.0–100.0)
MCV: 84.7 fL (ref 80.0–100.0)
Monocytes Absolute: 0.2 K/uL (ref 0.1–1.0)
Monocytes Absolute: 0.2 K/uL (ref 0.1–1.0)
Monocytes Relative: 3 %
Monocytes Relative: 3 %
Neutro Abs: 6.3 K/uL (ref 1.7–7.7)
Neutro Abs: 8.6 K/uL — ABNORMAL HIGH (ref 1.7–7.7)
Neutrophils Relative %: 89 %
Neutrophils Relative %: 89 %
Platelets: 33 K/uL — ABNORMAL LOW (ref 150–400)
Platelets: 38 K/uL — ABNORMAL LOW (ref 150–400)
RBC: 3.03 MIL/uL — ABNORMAL LOW (ref 4.22–5.81)
RBC: 3.07 MIL/uL — ABNORMAL LOW (ref 4.22–5.81)
RDW: 14.6 % (ref 11.5–15.5)
RDW: 14.6 % (ref 11.5–15.5)
WBC: 7.1 K/uL (ref 4.0–10.5)
WBC: 9.7 K/uL (ref 4.0–10.5)
nRBC: 0 % (ref 0.0–0.2)
nRBC: 0 % (ref 0.0–0.2)

## 2024-04-20 LAB — RESPIRATORY PANEL BY PCR

## 2024-04-20 LAB — BLOOD CULTURE ID PANEL (REFLEXED) - BCID2

## 2024-04-20 LAB — LACTIC ACID, PLASMA
Lactic Acid, Venous: 2.7 mmol/L (ref 0.5–1.9)
Lactic Acid, Venous: 3.4 mmol/L (ref 0.5–1.9)
Lactic Acid, Venous: 4.5 mmol/L (ref 0.5–1.9)

## 2024-04-20 LAB — I-STAT ARTERIAL BLOOD GAS, ED
Acid-base deficit: 9 mmol/L — ABNORMAL HIGH (ref 0.0–2.0)
Bicarbonate: 14.8 mmol/L — ABNORMAL LOW (ref 20.0–28.0)
Calcium, Ion: 1.05 mmol/L — ABNORMAL LOW (ref 1.15–1.40)
HCT: 24 % — ABNORMAL LOW (ref 39.0–52.0)
Hemoglobin: 8.2 g/dL — ABNORMAL LOW (ref 13.0–17.0)
O2 Saturation: 100 %
Patient temperature: 104.3
Potassium: 3.6 mmol/L (ref 3.5–5.1)
Sodium: 138 mmol/L (ref 135–145)
TCO2: 15 mmol/L — ABNORMAL LOW (ref 22–32)
pCO2 arterial: 27.2 mmHg — ABNORMAL LOW (ref 32–48)
pH, Arterial: 7.356 (ref 7.35–7.45)
pO2, Arterial: 535 mmHg — ABNORMAL HIGH (ref 83–108)

## 2024-04-20 LAB — URINALYSIS, ROUTINE W REFLEX MICROSCOPIC
Bilirubin Urine: NEGATIVE
Glucose, UA: NEGATIVE mg/dL
Ketones, ur: NEGATIVE mg/dL
Nitrite: NEGATIVE
Protein, ur: 30 mg/dL — AB
RBC / HPF: >50 RBC/hpf (ref 0–5)
Specific Gravity, Urine: 1.018 (ref 1.005–1.030)
pH: 5 (ref 5.0–8.0)

## 2024-04-20 LAB — COMPREHENSIVE METABOLIC PANEL WITH GFR
ALT: 115 U/L — ABNORMAL HIGH (ref 0–44)
ALT: 103 U/L — ABNORMAL HIGH (ref 0–44)
AST: 246 U/L — ABNORMAL HIGH (ref 15–41)
AST: 223 U/L — ABNORMAL HIGH (ref 15–41)
Albumin: 1.9 g/dL — ABNORMAL LOW (ref 3.5–5.0)
Albumin: 1.6 g/dL — ABNORMAL LOW (ref 3.5–5.0)
Alkaline Phosphatase: 112 U/L (ref 38–126)
Alkaline Phosphatase: 95 U/L (ref 38–126)
Anion gap: 15 (ref 5–15)
Anion gap: 14 (ref 5–15)
BUN: 112 mg/dL — ABNORMAL HIGH (ref 8–23)
BUN: 110 mg/dL — ABNORMAL HIGH (ref 8–23)
CO2: 15 mmol/L — ABNORMAL LOW (ref 22–32)
CO2: 15 mmol/L — ABNORMAL LOW (ref 22–32)
Calcium: 7 mg/dL — ABNORMAL LOW (ref 8.9–10.3)
Calcium: 6.8 mg/dL — ABNORMAL LOW (ref 8.9–10.3)
Chloride: 106 mmol/L (ref 98–111)
Chloride: 107 mmol/L (ref 98–111)
Creatinine, Ser: 3.64 mg/dL — ABNORMAL HIGH (ref 0.61–1.24)
Creatinine, Ser: 3.61 mg/dL — ABNORMAL HIGH (ref 0.61–1.24)
GFR, Estimated: 18 mL/min — ABNORMAL LOW (ref >60)
GFR, Estimated: 18 mL/min — ABNORMAL LOW (ref >60)
Glucose, Bld: 104 mg/dL — ABNORMAL HIGH (ref 70–99)
Glucose, Bld: 102 mg/dL — ABNORMAL HIGH (ref 70–99)
Potassium: 3.7 mmol/L (ref 3.5–5.1)
Potassium: 3.5 mmol/L (ref 3.5–5.1)
Sodium: 136 mmol/L (ref 135–145)
Sodium: 136 mmol/L (ref 135–145)
Total Bilirubin: 1.1 mg/dL (ref 0.0–1.2)
Total Bilirubin: 1.2 mg/dL (ref 0.0–1.2)
Total Protein: 4.9 g/dL — ABNORMAL LOW (ref 6.5–8.1)
Total Protein: 4.3 g/dL — ABNORMAL LOW (ref 6.5–8.1)

## 2024-04-20 LAB — POCT I-STAT 7, (LYTES, BLD GAS, ICA,H+H)
Acid-base deficit: 12 mmol/L — ABNORMAL HIGH (ref 0.0–2.0)
Bicarbonate: 15.8 mmol/L — ABNORMAL LOW (ref 20.0–28.0)
Calcium, Ion: 0.96 mmol/L — ABNORMAL LOW (ref 1.15–1.40)
HCT: 27 % — ABNORMAL LOW (ref 39.0–52.0)
Hemoglobin: 9.2 g/dL — ABNORMAL LOW (ref 13.0–17.0)
O2 Saturation: 100 %
Potassium: 3.7 mmol/L (ref 3.5–5.1)
Sodium: 138 mmol/L (ref 135–145)
TCO2: 17 mmol/L — ABNORMAL LOW (ref 22–32)
pCO2 arterial: 41.4 mmHg (ref 32–48)
pH, Arterial: 7.189 — CL (ref 7.35–7.45)
pO2, Arterial: 283 mmHg — ABNORMAL HIGH (ref 83–108)

## 2024-04-20 LAB — PROTIME-INR
INR: 1.7 — ABNORMAL HIGH (ref 0.8–1.2)
Prothrombin Time: 20.4 s — ABNORMAL HIGH (ref 11.4–15.2)

## 2024-04-20 LAB — ECHOCARDIOGRAM COMPLETE
Area-P 1/2: 4.68 cm2
Calc EF: 44.2 %
Height: 67.2 in
S' Lateral: 3.6 cm
Single Plane A2C EF: 43.3 %
Single Plane A4C EF: 44.1 %
Weight: 2256 [oz_av]

## 2024-04-20 LAB — APTT: aPTT: 65 s — ABNORMAL HIGH (ref 24–36)

## 2024-04-20 LAB — PHOSPHORUS: Phosphorus: 4.8 mg/dL — ABNORMAL HIGH (ref 2.5–4.6)

## 2024-04-20 LAB — CBC
HCT: 21.8 % — ABNORMAL LOW (ref 39.0–52.0)
HCT: 25.5 % — ABNORMAL LOW (ref 39.0–52.0)
HCT: 29.6 % — ABNORMAL LOW (ref 39.0–52.0)
Hemoglobin: 7.3 g/dL — ABNORMAL LOW (ref 13.0–17.0)
Hemoglobin: 9 g/dL — ABNORMAL LOW (ref 13.0–17.0)
Hemoglobin: 10.5 g/dL — ABNORMAL LOW (ref 13.0–17.0)
MCH: 28.5 pg (ref 26.0–34.0)
MCH: 29.3 pg (ref 26.0–34.0)
MCH: 29.1 pg (ref 26.0–34.0)
MCHC: 33.5 g/dL (ref 30.0–36.0)
MCHC: 35.3 g/dL (ref 30.0–36.0)
MCHC: 35.5 g/dL (ref 30.0–36.0)
MCV: 85.2 fL (ref 80.0–100.0)
MCV: 83.1 fL (ref 80.0–100.0)
MCV: 82 fL (ref 80.0–100.0)
Platelets: 28 K/uL — CL (ref 150–400)
Platelets: 26 K/uL — CL (ref 150–400)
Platelets: 22 K/uL — CL (ref 150–400)
RBC: 2.56 MIL/uL — ABNORMAL LOW (ref 4.22–5.81)
RBC: 3.07 MIL/uL — ABNORMAL LOW (ref 4.22–5.81)
RBC: 3.61 MIL/uL — ABNORMAL LOW (ref 4.22–5.81)
RDW: 14.6 % (ref 11.5–15.5)
RDW: 14.5 % (ref 11.5–15.5)
RDW: 14.6 % (ref 11.5–15.5)
WBC: 6.4 K/uL (ref 4.0–10.5)
WBC: 8.6 K/uL (ref 4.0–10.5)
WBC: 11.2 K/uL — ABNORMAL HIGH (ref 4.0–10.5)
nRBC: 0 % (ref 0.0–0.2)
nRBC: 0 % (ref 0.0–0.2)
nRBC: 0 % (ref 0.0–0.2)

## 2024-04-20 LAB — MRSA NEXT GEN BY PCR, NASAL: MRSA by PCR Next Gen: NOT DETECTED

## 2024-04-20 LAB — HEMOGLOBIN A1C
Hgb A1c MFr Bld: 6.1 % — ABNORMAL HIGH (ref 4.8–5.6)
Mean Plasma Glucose: 128.37 mg/dL

## 2024-04-20 LAB — HEPATITIS PANEL, ACUTE
HCV Ab: NONREACTIVE
Hep A IgM: NONREACTIVE
Hep B C IgM: NONREACTIVE
Hepatitis B Surface Ag: NONREACTIVE

## 2024-04-20 LAB — MAGNESIUM: Magnesium: 2.1 mg/dL (ref 1.7–2.4)

## 2024-04-20 LAB — I-STAT CG4 LACTIC ACID, ED: Lactic Acid, Venous: 3.4 mmol/L (ref 0.5–1.9)

## 2024-04-20 LAB — PREPARE RBC (CROSSMATCH)

## 2024-04-20 LAB — GLUCOSE, CAPILLARY
Glucose-Capillary: 95 mg/dL (ref 70–99)
Glucose-Capillary: 77 mg/dL (ref 70–99)
Glucose-Capillary: 113 mg/dL — ABNORMAL HIGH (ref 70–99)
Glucose-Capillary: 137 mg/dL — ABNORMAL HIGH (ref 70–99)
Glucose-Capillary: 114 mg/dL — ABNORMAL HIGH (ref 70–99)

## 2024-04-20 LAB — HIV ANTIBODY (ROUTINE TESTING W REFLEX): HIV Screen 4th Generation wRfx: NONREACTIVE

## 2024-04-20 LAB — SODIUM: Sodium: 141 mmol/L (ref 135–145)

## 2024-04-20 LAB — TROPONIN I (HIGH SENSITIVITY): Troponin I (High Sensitivity): >24000 ng/L (ref <18)

## 2024-04-20 LAB — POCT ACTIVATED CLOTTING TIME: Activated Clotting Time: 256 s

## 2024-04-20 SURGERY — THROMBECTOMY, ARTERY, FEMORAL
Anesthesia: General | Site: Leg Lower | Laterality: Left

## 2024-04-20 MED ORDER — STROKE: EARLY STAGES OF RECOVERY BOOK
Freq: Once | Status: AC
  Filled 2024-04-20: qty 1

## 2024-04-20 MED ORDER — CHLORHEXIDINE GLUCONATE CLOTH 2 % EX PADS
6.0000 | MEDICATED_PAD | Freq: Every day | CUTANEOUS | Status: DC
Start: 2024-04-20 — End: 2024-04-21
  Administered 2024-04-20 – 2024-04-21 (×2): 6 via TOPICAL

## 2024-04-20 MED ORDER — SODIUM CHLORIDE 3 % IV SOLN
INTRAVENOUS | Status: DC
  Administered 2024-04-21: 75 mL/h via INTRAVENOUS
  Filled 2024-04-20 (×4): qty 500

## 2024-04-20 MED ORDER — VASOPRESSIN 20 UNITS/100 ML INFUSION FOR SHOCK
0.0000 [IU]/min | INTRAVENOUS | Status: DC
  Administered 2024-04-20: 0.03 [IU]/min via INTRAVENOUS
  Filled 2024-04-20: qty 100

## 2024-04-20 MED ORDER — ONDANSETRON HCL 4 MG/2ML IJ SOLN: 4.0000 mg | Freq: Four times a day (QID) | INTRAMUSCULAR | Status: DC | PRN

## 2024-04-20 MED ORDER — VASOPRESSIN 20 UNITS/100 ML INFUSION FOR SHOCK
INTRAVENOUS | Status: DC | PRN
  Administered 2024-04-20: .04 [IU]/min via INTRAVENOUS

## 2024-04-20 MED ORDER — DEXAMETHASONE SODIUM PHOSPHATE 10 MG/ML IJ SOLN
INTRAMUSCULAR | Status: AC
  Filled 2024-04-20: qty 1

## 2024-04-20 MED ORDER — SODIUM CHLORIDE 0.9 % IV SOLN: 250.0000 mL | INTRAVENOUS | Status: AC

## 2024-04-20 MED ORDER — PROPOFOL 1000 MG/100ML IV EMUL
0.0000 ug/kg/min | INTRAVENOUS | Status: DC
  Administered 2024-04-20: 40 ug/kg/min via INTRAVENOUS
  Administered 2024-04-20: 15 ug/kg/min via INTRAVENOUS
  Administered 2024-04-20: 50 ug/kg/min via INTRAVENOUS
  Filled 2024-04-20 (×2): qty 100

## 2024-04-20 MED ORDER — NOREPINEPHRINE 4 MG/250ML-% IV SOLN
0.0000 ug/min | INTRAVENOUS | Status: DC
Start: 2024-04-20 — End: 2024-04-20
  Administered 2024-04-20: 2 ug/min via INTRAVENOUS
  Filled 2024-04-20: qty 250

## 2024-04-20 MED ORDER — HEPARIN 6000 UNIT IRRIGATION SOLUTION
Status: AC
Start: 2024-04-20 — End: 2024-04-20
  Filled 2024-04-20: qty 500

## 2024-04-20 MED ORDER — ROCURONIUM BROMIDE 10 MG/ML (PF) SYRINGE
PREFILLED_SYRINGE | INTRAVENOUS | Status: DC | PRN
  Administered 2024-04-20 (×2): 50 mg via INTRAVENOUS

## 2024-04-20 MED ORDER — SODIUM CHLORIDE 0.9 % IV SOLN
250.0000 mL | INTRAVENOUS | Status: AC
  Administered 2024-04-20: 250 mL via INTRAVENOUS

## 2024-04-20 MED ORDER — SODIUM BICARBONATE 8.4 % IV SOLN
INTRAVENOUS | Status: DC | PRN
  Administered 2024-04-20 (×2): 50 meq via INTRAVENOUS

## 2024-04-20 MED ORDER — ROCURONIUM BROMIDE 10 MG/ML (PF) SYRINGE
PREFILLED_SYRINGE | INTRAVENOUS | Status: AC
  Filled 2024-04-20: qty 10

## 2024-04-20 MED ORDER — MIDAZOLAM HCL 2 MG/2ML IJ SOLN
2.0000 mg | INTRAMUSCULAR | Status: DC | PRN
  Filled 2024-04-20 (×3): qty 2

## 2024-04-20 MED ORDER — SODIUM CHLORIDE 0.9 % IV SOLN: INTRAVENOUS | Status: DC

## 2024-04-20 MED ORDER — ACETAMINOPHEN 10 MG/ML IV SOLN
1000.0000 mg | Freq: Once | INTRAVENOUS | Status: AC
  Administered 2024-04-20: 1000 mg via INTRAVENOUS
  Filled 2024-04-20: qty 100

## 2024-04-20 MED ORDER — PANTOPRAZOLE SODIUM 40 MG IV SOLR
40.0000 mg | Freq: Every day | INTRAVENOUS | Status: DC
  Administered 2024-04-20: 40 mg via INTRAVENOUS
  Filled 2024-04-20: qty 10

## 2024-04-20 MED ORDER — FENTANYL 2500MCG IN NS 250ML (10MCG/ML) PREMIX INFUSION
0.0000 ug/h | INTRAVENOUS | Status: DC
  Administered 2024-04-21: 50 ug/h via INTRAVENOUS
  Filled 2024-04-20: qty 250

## 2024-04-20 MED ORDER — LIDOCAINE 2% (20 MG/ML) 5 ML SYRINGE
INTRAMUSCULAR | Status: AC
  Filled 2024-04-20: qty 5

## 2024-04-20 MED ORDER — ATORVASTATIN CALCIUM 10 MG PO TABS
20.0000 mg | ORAL_TABLET | Freq: Every day | ORAL | Status: DC
Start: 2024-04-20 — End: 2024-04-21
  Administered 2024-04-20 – 2024-04-21 (×2): 20 mg
  Filled 2024-04-20 (×2): qty 2

## 2024-04-20 MED ORDER — METRONIDAZOLE 500 MG/100ML IV SOLN
500.0000 mg | Freq: Two times a day (BID) | INTRAVENOUS | Status: DC
  Administered 2024-04-20: 500 mg via INTRAVENOUS
  Filled 2024-04-20: qty 100

## 2024-04-20 MED ORDER — PANTOPRAZOLE SODIUM 40 MG IV SOLR
40.0000 mg | Freq: Two times a day (BID) | INTRAVENOUS | Status: DC
  Administered 2024-04-20 – 2024-04-21 (×3): 40 mg via INTRAVENOUS
  Filled 2024-04-20 (×3): qty 10

## 2024-04-20 MED ORDER — PHENYLEPHRINE 80 MCG/ML (10ML) SYRINGE FOR IV PUSH (FOR BLOOD PRESSURE SUPPORT)
PREFILLED_SYRINGE | INTRAVENOUS | Status: DC | PRN
  Administered 2024-04-20: 160 ug via INTRAVENOUS
  Administered 2024-04-20: 240 ug via INTRAVENOUS

## 2024-04-20 MED ORDER — HEMOSTATIC AGENTS (NO CHARGE) OPTIME
TOPICAL | Status: DC | PRN
  Administered 2024-04-20: 1 via TOPICAL

## 2024-04-20 MED ORDER — HEPARIN (PORCINE) 25000 UT/250ML-% IV SOLN
800.0000 [IU]/h | INTRAVENOUS | Status: DC
  Administered 2024-04-20: 800 [IU]/h via INTRAVENOUS
  Filled 2024-04-20: qty 250

## 2024-04-20 MED ORDER — INSULIN ASPART 100 UNIT/ML IJ SOLN
0.0000 [IU] | INTRAMUSCULAR | Status: DC
  Administered 2024-04-20 – 2024-04-21 (×2): 1 [IU] via SUBCUTANEOUS

## 2024-04-20 MED ORDER — DEXAMETHASONE SODIUM PHOSPHATE 10 MG/ML IJ SOLN
INTRAMUSCULAR | Status: DC | PRN
  Administered 2024-04-20: 10 mg via INTRAVENOUS

## 2024-04-20 MED ORDER — SODIUM CHLORIDE 0.9% IV SOLUTION: Freq: Once | INTRAVENOUS | Status: DC

## 2024-04-20 MED ORDER — HEPARIN SODIUM (PORCINE) 1000 UNIT/ML IJ SOLN
INTRAMUSCULAR | Status: DC | PRN
Start: 2024-04-20 — End: 2024-04-20
  Administered 2024-04-20: 6000 [IU] via INTRAVENOUS

## 2024-04-20 MED ORDER — 0.9 % SODIUM CHLORIDE (POUR BTL) OPTIME
TOPICAL | Status: DC | PRN
  Administered 2024-04-20: 2000 mL

## 2024-04-20 MED ORDER — PROPOFOL 500 MG/50ML IV EMUL
INTRAVENOUS | Status: DC | PRN
  Administered 2024-04-20: 50 ug/kg/min via INTRAVENOUS

## 2024-04-20 MED ORDER — ALBUMIN HUMAN 5 % IV SOLN: INTRAVENOUS | Status: DC | PRN

## 2024-04-20 MED ORDER — NOREPINEPHRINE 4 MG/250ML-% IV SOLN
0.0000 ug/min | INTRAVENOUS | Status: DC
  Administered 2024-04-20 – 2024-04-21 (×2): 2 ug/min via INTRAVENOUS
  Filled 2024-04-20: qty 250

## 2024-04-20 MED ORDER — FENTANYL 2500MCG IN NS 250ML (10MCG/ML) PREMIX INFUSION
0.0000 ug/h | INTRAVENOUS | Status: DC
  Administered 2024-04-20: 25 ug/h via INTRAVENOUS
  Filled 2024-04-20: qty 250

## 2024-04-20 MED ORDER — HEPARIN 6000 UNIT IRRIGATION SOLUTION
Status: DC | PRN
  Administered 2024-04-20: 1

## 2024-04-20 MED ORDER — CALCIUM CHLORIDE 10 % IV SOLN
INTRAVENOUS | Status: DC | PRN
  Administered 2024-04-20 (×5): 200 mg via INTRAVENOUS

## 2024-04-20 MED ORDER — LACTATED RINGERS IV SOLN: INTRAVENOUS | Status: DC | PRN

## 2024-04-20 MED ORDER — SODIUM CHLORIDE 0.9 % IV SOLN: INTRAVENOUS | Status: DC | PRN

## 2024-04-20 MED ORDER — CEFAZOLIN SODIUM-DEXTROSE 2-4 GM/100ML-% IV SOLN
2.0000 g | Freq: Two times a day (BID) | INTRAVENOUS | Status: DC
  Administered 2024-04-20: 2 g via INTRAVENOUS
  Filled 2024-04-20 (×2): qty 100

## 2024-04-20 MED ORDER — LACTATED RINGERS IV SOLN
INTRAVENOUS | Status: AC | PRN
Start: 2024-04-19 — End: 2024-04-19
  Administered 2024-04-19: 1000 mL/h via INTRAVENOUS

## 2024-04-20 MED ORDER — HEPARIN (PORCINE) 25000 UT/250ML-% IV SOLN: 600.0000 [IU]/h | INTRAVENOUS | Status: DC

## 2024-04-20 MED ORDER — EPHEDRINE 5 MG/ML INJ
INTRAVENOUS | Status: AC
  Filled 2024-04-20: qty 5

## 2024-04-20 MED ORDER — LACTATED RINGERS IV BOLUS
1000.0000 mL | Freq: Once | INTRAVENOUS | Status: AC
  Administered 2024-04-20: 1000 mL via INTRAVENOUS

## 2024-04-20 SURGICAL SUPPLY — 52 items
BAG COUNTER SPONGE SURGICOUNT (BAG) ×3 IMPLANT
BANDAGE ESMARK 6X9 LF (GAUZE/BANDAGES/DRESSINGS) IMPLANT
BENZOIN TINCTURE PRP APPL 2/3 (GAUZE/BANDAGES/DRESSINGS) ×3 IMPLANT
BNDG ELASTIC 4X5.8 VLCR STR LF (GAUZE/BANDAGES/DRESSINGS) IMPLANT
CANISTER SUCTION 3000ML PPV (SUCTIONS) ×3 IMPLANT
CANNULA VESSEL 3MM 2 BLNT TIP (CANNULA) IMPLANT
CATH EMB 3FR 80 (CATHETERS) IMPLANT
CATH EMB 4FR 80 (CATHETERS) IMPLANT
CLIP TI WIDE RED SMALL 6 (CLIP) IMPLANT
CNTNR URN SCR LID CUP LEK RST (MISCELLANEOUS) IMPLANT
CUFF TOURN SGL QUICK 42 (TOURNIQUET CUFF) IMPLANT
CUFF TRNQT CYL 24X4X16.5-23 (TOURNIQUET CUFF) IMPLANT
CUFF TRNQT CYL 34X4.125X (TOURNIQUET CUFF) IMPLANT
DRAPE X-RAY CASS 24X20 (DRAPES) IMPLANT
DRSG ADAPTIC 3X8 NADH LF (GAUZE/BANDAGES/DRESSINGS) IMPLANT
DRSG COVADERM 4X8 (GAUZE/BANDAGES/DRESSINGS) IMPLANT
DRSG EMULSION OIL 3X3 NADH (GAUZE/BANDAGES/DRESSINGS) IMPLANT
ELECTRODE REM PT RTRN 9FT ADLT (ELECTROSURGICAL) ×3 IMPLANT
EVACUATOR SILICONE 100CC (DRAIN) IMPLANT
GAUZE SPONGE 4X4 12PLY STRL (GAUZE/BANDAGES/DRESSINGS) IMPLANT
GLOVE BIOGEL PI IND STRL 7.5 (GLOVE) ×3 IMPLANT
GLOVE SURG SS PI 7.5 STRL IVOR (GLOVE) ×3 IMPLANT
GOWN STRL REUS W/ TWL LRG LVL3 (GOWN DISPOSABLE) ×6 IMPLANT
GOWN STRL REUS W/ TWL XL LVL3 (GOWN DISPOSABLE) ×3 IMPLANT
HEMOSTAT SNOW SURGICEL 2X4 (HEMOSTASIS) IMPLANT
KIT BASIN OR (CUSTOM PROCEDURE TRAY) ×3 IMPLANT
KIT TURNOVER KIT B (KITS) ×3 IMPLANT
MARKER GRAFT CORONARY BYPASS (MISCELLANEOUS) IMPLANT
PACK PERIPHERAL VASCULAR (CUSTOM PROCEDURE TRAY) ×3 IMPLANT
PAD ARMBOARD POSITIONER FOAM (MISCELLANEOUS) ×6 IMPLANT
SET COLLECT BLD 21X3/4 12 (NEEDLE) IMPLANT
SOLN 0.9% NACL 1000 ML (IV SOLUTION) ×4 IMPLANT
SOLN 0.9% NACL POUR BTL 1000ML (IV SOLUTION) ×6 IMPLANT
SOLN STERILE WATER 1000 ML (IV SOLUTION) ×2 IMPLANT
SOLN STERILE WATER BTL 1000 ML (IV SOLUTION) ×3 IMPLANT
STAPLER SKIN PROX 35W (STAPLE) IMPLANT
STOPCOCK 4 WAY LG BORE MALE ST (IV SETS) IMPLANT
STRIP CLOSURE SKIN 1/2X4 (GAUZE/BANDAGES/DRESSINGS) ×6 IMPLANT
SUT ETHILON 3 0 PS 1 (SUTURE) IMPLANT
SUT PROLENE 5 0 C 1 24 (SUTURE) ×3 IMPLANT
SUT PROLENE 6 0 BV (SUTURE) ×3 IMPLANT
SUT PROLENE 7 0 BV 1 (SUTURE) IMPLANT
SUT SILK 2 0 SH (SUTURE) ×3 IMPLANT
SUT SILK 3-0 18XBRD TIE 12 (SUTURE) IMPLANT
SUT VIC AB 2-0 CT1 36 (SUTURE) IMPLANT
SUT VIC AB 2-0 CT1 TAPERPNT 27 (SUTURE) ×6 IMPLANT
SUT VIC AB 3-0 SH 27X BRD (SUTURE) ×6 IMPLANT
SUT VIC AB 4-0 PS2 18 (SUTURE) ×6 IMPLANT
TOWEL GREEN STERILE (TOWEL DISPOSABLE) ×3 IMPLANT
TRAY FOLEY MTR SLVR 16FR STAT (SET/KITS/TRAYS/PACK) ×3 IMPLANT
TUBING EXTENTION W/L.L. (IV SETS) IMPLANT
UNDERPAD 30X36 HEAVY ABSORB (UNDERPADS AND DIAPERS) ×3 IMPLANT

## 2024-04-20 NOTE — Progress Notes (Signed)
 Interval PCCM Note  63 year old male with acute limb ischemia of LLE s/p popliteal and tibial thrombectomy and fasciotomies x 4 compartments admitted to ICU overnight. On levophed  and vasopressin . Overnight received PRBC x 2U and plt x 2U for Hg dropping 10.8>7.3 for possible GIB. Heparin  not yet initiated so help overnight.  Blood pressure 90/67, pulse 93, temperature 99 F (37.2 C), resp. rate 18, height 5' 7.2 (1.707 m), weight 64 kg, SpO2 100%.  Physical Exam: General: Critically ill-appearing, sedated HENT: [place], AT, ETT in place Eyes: EOMI, no scleral icterus Respiratory: Clear to auscultation bilaterally.  No crackles, wheezing or rales Cardiovascular: RRR, -M/R/G, no JVD GI: BS+, soft, nontender Extremities:Warm,-Edema,-tenderness Neuro: Sedated GU: Foley in place  Imaging, labs and test in EMR in the last 24 hours reviewed independently by me. Pertinent findings below:  BUN/Cr 110/3.61 - worsening CO2 15 - worsening AST 223 ALT 103 Trop >24K LA 4.5 Hg 10.8>7.3>Transfused 2U. 9.2>8.7  Septic shock secondary critical limb ischemia- Wean levophed  and vasopressin  for MAP >65. Continue Vanc, Cefepime , Flagyl . Trend LA. F/u cultures Acute hypoxemic respiratory failure 2/2 Coronavirus HKU1 - Droplet precautions. Full vent support. SBT/WUA when able Possible GI bleed- 3-4 point Hg drop post-op. Hold on initiating heparin . Will restart when Hg remains stable. PPI BID. Trend CBC Elevated LFTs, mild coagulopathy - Trend LFTs and INR  Attempted to update wife however she had difficulty identifying patient on the phone and seemed confused that her husband was in the hospital. Tried to ask if another person was available in the household and said she was alone. She became distressed and unable to form coherent questions and statements which she attributed to lack of sleep. I reassured wife that I was calling to update family and would call later at a more convenient time.  Additional  Critical Care Time: 50 min  The patient is critically ill with multiple organ systems failure and requires high complexity decision making for assessment and support, frequent evaluation and titration of therapies, application of advanced monitoring technologies and extensive interpretation of multiple databases.   Slater Staff, M.D. [facility] Pulmonary/Critical Care Medicine 04/20/2024 9:06 AM   Please see Amion for pager number to reach on-call Pulmonary and Critical Care Team.

## 2024-04-20 NOTE — Anesthesia Preprocedure Evaluation (Signed)
 Anesthesia Evaluation  Patient identified by MRN, date of birth, ID band Patient unresponsive  Preop documentation limited or incomplete due to emergent nature of procedure.  Airway Mallampati: Intubated  TM Distance: >3 FB Neck ROM: Full    Dental   Pulmonary    breath sounds clear to auscultation       Cardiovascular + Valvular Problems/Murmurs  Rhythm:Regular Rate:Tachycardia     Neuro/Psych    GI/Hepatic   Endo/Other    Renal/GU      Musculoskeletal   Abdominal   Peds  Hematology   Anesthesia Other Findings   Reproductive/Obstetrics                              Anesthesia Physical Anesthesia Plan  ASA: 4 and emergent  Anesthesia Plan: General   Post-op Pain Management:    Induction: Intravenous  PONV Risk Score and Plan: 2 and Dexamethasone  and Treatment may vary due to age or medical condition  Airway Management Planned: Oral ETT  Additional Equipment: Arterial line, CVP and Ultrasound Guidance Line Placement  Intra-op Plan:   Post-operative Plan: Post-operative intubation/ventilation  Informed Consent: I have reviewed the patients History and Physical, chart, labs and discussed the procedure including the risks, benefits and alternatives for the proposed anesthesia with the patient or authorized representative who has indicated his/her understanding and acceptance.       Plan Discussed with:   Anesthesia Plan Comments:         Anesthesia Quick Evaluation

## 2024-04-20 NOTE — TOC CM/SW Note (Signed)
 Transition of Care [facility]) - Inpatient Brief Assessment   Patient Details  Name: Luis Ferguson MRN: [id number] Date of Birth: [birth year]  Transition of Care [facility]) CM/SW Contact:    Tom-Basden, Harvest Muskrat, RN Phone Number: 04/20/2024, 1:55 PM   Clinical Narrative:  Patient presented to the ED with worsening Generalized Weakness, Fatigue, Cough, Congestion, N/V. Patient has hx of Lupus, HLD, Anemia, GERD, CKD, Aortic Valve Regurgitation, Psoriatic arthritis, BPH. Patient intubated fr worsening Respiratory Failure, remains intubated and sedated. On IV abx.   Patient not Medically ready for discharge.  CM will continue to follow as patient progresses with care towards discharge.         Transition of Care Asessment:

## 2024-04-20 NOTE — Progress Notes (Signed)
 [facility] [place] PROGRESS NOTE  ASSESSMENT / PLAN: Luis Ferguson is a 63 y.o. male with left leg acute limb ischemia status post popliteal and tibial thrombectomy and 4 compartment fasciotomies.  Remains critically ill this morning, but vasopressor requirement is reduced.  Ideally would start heparin  as soon as critical care team feels it safe.  Agree with holding for now while thrombocytopenia is pronounced.  SUBJECTIVE: Intubated and sedated.  OBJECTIVE: BP 90/67   Pulse 93   Temp 99 F (37.2 C)   Resp 18   Ht 5' 7.2 (1.707 m)   Wt 64 kg Comment: from 04/10/2024  SpO2 99%   BMI 21.95 kg/m   Intake/Output Summary (Last 24 hours) at 04/20/2024 0753 Last data filed at 04/20/2024 0616 Gross per 24 hour  Intake 6154.69 ml  Output 90 ml  Net 6064.69 ml    Intubated and sedated Regular rate and rhythm On low-dose Levophed  and vasopressin  Equal chest rise, no desynchrony Abdomen soft and nontender Weakly palpable left dorsalis pedis pulse Bandages are clean and dry on the left calf     Latest Ref Rng & Units 04/20/2024    4:56 AM 04/20/2024    1:47 AM 04/20/2024   12:50 AM  CBC  WBC 4.0 - 10.5 K/uL  6.4    Hemoglobin 13.0 - 17.0 g/dL 9.2  7.3  8.2   Hematocrit 39.0 - 52.0 % 27.0  21.8  24.0   Platelets 150 - 400 K/uL  28          Latest Ref Rng & Units 04/20/2024    4:56 AM 04/20/2024    1:47 AM 04/20/2024   12:50 AM  CMP  Glucose 70 - 99 mg/dL  897    BUN 8 - 23 mg/dL  889    Creatinine 9.38 - 1.24 mg/dL  6.38    Sodium 864 - 854 mmol/L 138  136  138   Potassium 3.5 - 5.1 mmol/L 3.7  3.5  3.6   Chloride 98 - 111 mmol/L  107    CO2 22 - 32 mmol/L  15    Calcium  8.9 - 10.3 mg/dL  6.8    Total Protein 6.5 - 8.1 g/dL  4.3    Total Bilirubin 0.0 - 1.2 mg/dL  1.2    Alkaline Phos 38 - 126 U/L  95    AST 15 - 41 U/L  223    ALT 0 - 44 U/L  103      Estimated Creatinine Clearance: 18.7 mL/min (A) (by C-G formula based on SCr of 3.61 mg/dL  (H)).  Debby SAILOR. Magda, MD [facility] Phone Number: [phone] 04/20/2024 7:53 AM

## 2024-04-20 NOTE — Transfer of Care (Signed)
 Immediate Anesthesia Transfer of Care Note  Patient: Luis Ferguson  Procedure(s) Performed: THROMBECTOMY, ARTERY, FEMORAL (Left) FASCIOTOMY, CALF (Left: Leg Lower)  Patient Location: ICU  Anesthesia Type:General  Level of Consciousness: Patient remains intubated per anesthesia plan  Airway & Oxygen Therapy: Patient remains intubated per anesthesia plan and Patient placed on Ventilator (see vital sign flow sheet for setting)  Post-op Assessment: Report given to RN and Post -op Vital signs reviewed and stable  Post vital signs: Reviewed and stable  Last Vitals:  Vitals Value Taken Time  BP 133/70 04/20/24 06:10  Temp 37.2 C 04/20/24 06:10  Pulse 94 04/20/24 06:09  Resp 18 04/20/24 06:10  SpO2 98 % 04/20/24 06:09  Vitals shown include unfiled device data.  Last Pain:  Vitals:   04/19/24 2312  TempSrc:   PainSc: 0-No pain         Complications: No notable events documented.

## 2024-04-20 NOTE — Progress Notes (Signed)
 eLink Physician-Brief Progress Note Patient Name: Luis Ferguson DOB: June 22, 1961 MRN: [id number]   Date of Service  04/20/2024  HPI/Events of Note  Patient admitted with acute hypoxemic respiratory failure and septic shock, patient also has lower limb ischemia secondary to a blood clo and will be going to the OR this morning.  eICU Interventions  New Patient Evaluation        Marcellina RAYMOND Dub 04/20/2024, 2:39 AM

## 2024-04-20 NOTE — Progress Notes (Signed)
 ABG not transmitting to chart:  Results: PH= 7.33 PCO2= 33.6 BE= -7 HCO3-= 17.6 TCO2= 19 sO2= 97

## 2024-04-20 NOTE — Plan of Care (Signed)
  Problem: Metabolic: Goal: Ability to maintain appropriate glucose levels will improve Outcome: Progressing   Problem: Tissue Perfusion: Goal: Adequacy of tissue perfusion will improve Outcome: Progressing   Problem: Clinical Measurements: Goal: Ability to maintain clinical measurements within normal limits will improve Outcome: Progressing Goal: Will remain free from infection Outcome: Progressing Goal: Respiratory complications will improve Outcome: Progressing Goal: Cardiovascular complication will be avoided Outcome: Progressing   Problem: Fluid Volume: Goal: Ability to maintain a balanced intake and output will improve Outcome: Not Progressing   Problem: Nutritional: Goal: Maintenance of adequate nutrition will improve Outcome: Not Progressing   Problem: Skin Integrity: Goal: Risk for impaired skin integrity will decrease Outcome: Not Progressing

## 2024-04-20 NOTE — Progress Notes (Signed)
 Noted CTH from earlier today  CTH from today at 1152 1. Large area of low attenuation over the right MCA territory compatible with subacute infarct. Mild associated local mass effect. No acute hemorrhage. 2. Mild chronic ischemic microvascular disease. 3. Mild chronic sinus inflammatory disease    Pt has been sedated on fentanyl /propofol  with neuro exam limited since intubation last evening.  Pupils have been 2/sluggish.    On chart review, pt was non-focal with appropriate conversation in ER last evening, noted hx of sun downing and not answering questions appropriately per caregiver.     P:  - heparin  gtt stopped given high risk of hemorrhagic conversion with large subacute R MCA stroke - Neurology consulted, discussed with Dr. Michaela, agrees with holding heparin  and no ASA given plts.  Appreciate further stroke recs, likely repeat imaging CTH vs MRI.  Given MSSA bacteremia, septic shock, coagulapathy, ischemic foot and now evidence of CVA, concerning for septic emboli, pending TTE.    Wife, Luis Ferguson called and updated on events.  Discussed very high risk of complications/ decompensation.  She is planning to return to hospital tonight and will address further GOC care then as she is reasonably upset and devastated.     CCT 35 mins   Luis Pesa, NP [facility] 04/20/2024, 4:54 PM  See Amion for pager If no response to pager , please call [phone] until 7pm After 7:00 pm call Elink  [phone]

## 2024-04-20 NOTE — Progress Notes (Addendum)
 PHARMACY - ANTICOAGULATION CONSULT NOTE  Pharmacy Consult for heparin  Indication: LLE ischemia now s/p thrombectomy  Allergies  Allergen Reactions   Ace Inhibitors Cough   Codeine Itching and Rash    Patient Measurements: Height: 5' 7.2 (170.7 cm) Weight: 64 kg (141 lb) (from 04/10/2024) IBW/kg (Calculated) : 66.56  Vital Signs: Temp: 98.4 F (36.9 C) (09/25 0325) Temp Source: Axillary (09/24 2200) BP: 90/67 (09/25 0320) Pulse Rate: 105 (09/25 0325)  Labs: Recent Labs    04/19/24 1219 04/19/24 2327 04/20/24 0017 04/20/24 0050 04/20/24 0147  HGB 10.8*   < > 8.8* 8.2* 7.3*  HCT 32.0*   < > 26.0* 24.0* 21.8*  PLT 49*  --  33*  --  28*  APTT  --   --   --   --  65*  LABPROT  --   --   --   --  20.4*  INR  --   --   --   --  1.7*  CREATININE 3.44*  --  3.64*  --  3.61*  TROPONINIHS  --   --  >24,000*  --   --    < > = values in this interval not displayed.    Estimated Creatinine Clearance: 18.7 mL/min (A) (by C-G formula based on SCr of 3.61 mg/dL (H)).   Medical History: Past Medical History:  Diagnosis Date   Anemia    Aortic stenosis    Arthritis    achiness in shoulder , elbows   Connective tissue disease    followed by Dr. Leni, [facility], pt. off Prednisone  since mid 2017   Fatigue    Headache    Lupus (systemic lupus erythematosus) (HCC)    Paravalvular leak (prosthetic valve), initial encounter 09/17/2017   Perivalvular leak of prosthetic heart valve    PONV (postoperative nausea and vomiting)    Psoriasis    below the knee on both legs    Wears glasses     Medications:  Medications Prior to Admission  Medication Sig Dispense Refill Last Dose/Taking   acetaminophen  (TYLENOL ) 650 MG CR tablet Take 1,300 mg by mouth every 8 (eight) hours as needed for pain.   Past Week   atorvastatin  (LIPITOR) 20 MG tablet Take 20 mg by mouth daily.    Past Week   calcium  carbonate (TUMS EX) 750 MG chewable tablet Chew 1 tablet by mouth daily.   Past Week    Cholecalciferol  (VITAMIN D ) 50 MCG (2000 UT) tablet Take 2,000 Units by mouth daily.   Past Week   Dextromethorphan-guaiFENesin (MUCINEX DM PO) Take 1 tablet by mouth as needed.   Past Week   diphenhydrAMINE  HCl (BENADRYL  ALLERGY PO) Take 0.5 tablets by mouth as needed.   Past Month   Famotidine  (PEPCID  PO) Take 1 tablet by mouth as needed.   Unknown   fexofenadine (ALLEGRA) 60 MG tablet Take 60 mg by mouth daily.   Past Week   hydroxychloroquine  (PLAQUENIL ) 200 MG tablet Take 400 mg by mouth daily.    Taking   metoprolol  tartrate (LOPRESSOR ) 50 MG tablet TAKE 1 TABLET TWICE A DAY (PLEASE SCHEDULE OFFICE VISIT FOR REFILLS) (Patient taking differently: Take 50 mg by mouth 2 (two) times daily. (PLEASE SCHEDULE OFFICE VISIT FOR REFILLS)) 60 tablet 0 Past Week   Omega-3 Fatty Acids (FISH OIL) 1000 MG CAPS Take 2 capsules by mouth daily.   Past Week   TALTZ 80 MG/ML pen Inject 80 mg into the skin every 28 (twenty-eight) days.  Past Month   tamsulosin  (FLOMAX ) 0.4 MG CAPS capsule Take 0.4 mg by mouth daily after breakfast.    Past Week   COSENTYX SENSOREADY PEN 150 MG/ML SOAJ Inject 150 mg into the skin every 30 (thirty) days. (Patient not taking: Reported on 04/19/2024)   Not Taking   Scheduled:   sodium chloride    Intravenous Once   atorvastatin   20 mg Oral Daily   Chlorhexidine  Gluconate Cloth  6 each Topical Daily   insulin  aspart  0-9 Units Subcutaneous Q4H   pantoprazole  (PROTONIX ) IV  40 mg Intravenous QHS   sodium chloride  flush  3 mL Intravenous Q12H   tamsulosin   0.4 mg Oral QPC breakfast   vancomycin  variable dose per unstable renal function (pharmacist dosing)   Does not apply See admin instructions   Infusions:   sodium chloride  Stopped (04/19/24 2350)   sodium chloride      sodium chloride  150 mL/hr at 04/20/24 0324   sodium chloride      ceFEPime  (MAXIPIME ) IV Stopped (04/20/24 0123)   fentaNYL  infusion INTRAVENOUS 50 mcg/hr (04/20/24 0530)   norepinephrine  (LEVOPHED ) Adult  infusion 6 mcg/min (04/20/24 0548)    Assessment: 63yo male admitted w/ acute hypoxemic respiratory failure, went to OR for left leg ischemia, now s/p thrombectomy, to start heparin ; rec'd heparin  6000 units perioperatively; of note there is some concern for GIB, Hgb is low/dropping, and aPTT and INR are elevated at baseline without evidence of PTA anticoagulation, so will will start heparin  cautiously.  Addendum  Hgb drops significantly due to concern of GIB. Pt has received several units overnight. D/w teams, too risky for anticoagulation right now. We will hold off starting heparin .   Goal of Therapy:  Heparin  level 0.3-0.5 units/ml Monitor platelets by anticoagulation protocol: Yes   Plan:  Hold off heparin   Sergio Batch, PharmD, BCIDP, AAHIVP, CPP Infectious Disease Pharmacist 04/20/2024 7:46 AM

## 2024-04-20 NOTE — Brief Op Note (Signed)
 04/20/2024  5:36 AM  PATIENT:  Luis Ferguson  63 y.o. male  PRE-OPERATIVE DIAGNOSIS:  left leg ischemia  POST-OPERATIVE DIAGNOSIS:  left leg ischemia  PROCEDURE:  Procedure(s) with comments: THROMBECTOMY, ARTERY, FEMORAL (Left) - Supine, Popliteal approach FASCIOTOMY, CALF (Left) - Anterior, lateral, superficial posterior, and deep posterior fasciotomy  SURGEON:  Surgeons and Role:    * Magda Debby SAILOR, MD - Primary  PHYSICIAN ASSISTANT: Adina Sender, PA-C  ANESTHESIA:   general  EBL:   BLOOD ADMINISTERED:none  DRAINS: none   LOCAL MEDICATIONS USED:  NONE  SPECIMEN:  clot for path  DISPOSITION OF SPECIMEN:  PATHOLOGY  COUNTS:  correct  TOURNIQUET:  n/a  DICTATION: pending  PLAN OF CARE: ICU. Left leg optimized. Monitor for compartment syndrome. Leg loosely closed.  PATIENT DISPOSITION:  ICU - intubated and critically ill.   Delay start of Pharmacological VTE agent (>24hrs) due to surgical blood loss or risk of bleeding: no

## 2024-04-20 NOTE — Progress Notes (Signed)
 eLink Physician-Brief Progress Note Patient Name: Luis Ferguson DOB: 11-03-1960 MRN: [id number]   Date of Service  04/20/2024  HPI/Events of Note  Patient with sub-optimal sedation and analgesia on the ventilator.  eICU Interventions  Fentanyl  gtt and PRN Fentanyl  boluses ordered.        Kayleah Appleyard U Raigan Baria 04/20/2024, 11:57 PM

## 2024-04-20 NOTE — ED Provider Notes (Signed)
  Physical Exam  BP (!) 129/95   Pulse (!) 48   Temp (!) 103.5 F (39.7 C) (Axillary)   Resp (!) 23   Ht 5' 7.2 (1.707 m)   Wt 64 kg Comment: from 04/10/2024  SpO2 (!) 76%   BMI 21.95 kg/m   Physical Exam  Procedures  Procedures  ED Course / MDM    Medical Decision Making Amount and/or Complexity of Data Reviewed Labs: ordered. Radiology: ordered.  Risk Prescription drug management. Decision regarding hospitalization.   ***

## 2024-04-20 NOTE — Progress Notes (Addendum)
 PHARMACY - PHYSICIAN COMMUNICATION CRITICAL VALUE ALERT - BLOOD CULTURE IDENTIFICATION (BCID)  Luis Ferguson is an 63 y.o. male who presented to [facility] on 04/19/2024 with a chief complaint of left leg acute limb ischemia s/p popliteal and tibial thrombectomy and four compartment fasciotomies.  Assessment:  MSSA bacteremia (3/4 bottles)   Name of physician (or Provider) Contacted: Dr. Kassie  Current antibiotics:  Vancomycin  pharmacy to dose Cefepime  2g IV q24h Metronidazole  500mg  IV q12h  Changes to prescribed antibiotics recommended:  ID automatically consulted. Per Dr. Overton discontinue vancomycin , cefepime , and metronidazole .Start cefazolin  monotherapy.   Results for orders placed or performed during the hospital encounter of 04/19/24  Blood Culture ID Panel (Reflexed) (Collected: 04/20/2024 12:15 AM)  Result Value Ref Range   Enterococcus faecalis NOT DETECTED NOT DETECTED   Enterococcus Faecium NOT DETECTED NOT DETECTED   Listeria monocytogenes NOT DETECTED NOT DETECTED   Staphylococcus species DETECTED (A) NOT DETECTED   Staphylococcus aureus (BCID) DETECTED (A) NOT DETECTED   Staphylococcus epidermidis NOT DETECTED NOT DETECTED   Staphylococcus lugdunensis NOT DETECTED NOT DETECTED   Streptococcus species NOT DETECTED NOT DETECTED   Streptococcus agalactiae NOT DETECTED NOT DETECTED   Streptococcus pneumoniae NOT DETECTED NOT DETECTED   Streptococcus pyogenes NOT DETECTED NOT DETECTED   A.calcoaceticus-baumannii NOT DETECTED NOT DETECTED   Bacteroides fragilis NOT DETECTED NOT DETECTED   Enterobacterales NOT DETECTED NOT DETECTED   Enterobacter cloacae complex NOT DETECTED NOT DETECTED   Escherichia coli NOT DETECTED NOT DETECTED   Klebsiella aerogenes NOT DETECTED NOT DETECTED   Klebsiella oxytoca NOT DETECTED NOT DETECTED   Klebsiella pneumoniae NOT DETECTED NOT DETECTED   Proteus species NOT DETECTED NOT DETECTED   Salmonella species NOT DETECTED NOT DETECTED    Serratia marcescens NOT DETECTED NOT DETECTED   Haemophilus influenzae NOT DETECTED NOT DETECTED   Neisseria meningitidis NOT DETECTED NOT DETECTED   Pseudomonas aeruginosa NOT DETECTED NOT DETECTED   Stenotrophomonas maltophilia NOT DETECTED NOT DETECTED   Candida albicans NOT DETECTED NOT DETECTED   Candida auris NOT DETECTED NOT DETECTED   Candida glabrata NOT DETECTED NOT DETECTED   Candida krusei NOT DETECTED NOT DETECTED   Candida parapsilosis NOT DETECTED NOT DETECTED   Candida tropicalis NOT DETECTED NOT DETECTED   Cryptococcus neoformans/gattii NOT DETECTED NOT DETECTED   Meth resistant mecA/C and MREJ NOT DETECTED NOT DETECTED    Elma Fail, PharmD PGY1 Clinical Pharmacist [facility]  04/20/2024 10:31 AM

## 2024-04-20 NOTE — Anesthesia Procedure Notes (Signed)
 Arterial Line Insertion Start/End9/25/2025 3:50 AM, 04/20/2024 3:56 AM Performed by: Epifanio Charleston, MD, anesthesiologist  Patient location: Pre-op. Preanesthetic checklist: patient identified, IV checked, site marked, risks and benefits discussed, surgical consent, monitors and equipment checked, pre-op evaluation, timeout performed and anesthesia consent Lidocaine  1% used for infiltration Right, radial was placed Catheter size: 20 G Hand hygiene performed  and maximum sterile barriers used   Attempts: 1 Procedure performed using ultrasound guided technique. Ultrasound Notes:anatomy identified, needle tip was noted to be adjacent to the nerve/plexus identified, no ultrasound evidence of intravascular and/or intraneural injection and image(s) printed for medical record Following insertion, dressing applied and Biopatch. Post procedure assessment: normal and unchanged  Post procedure complications: second provider assisted. Patient tolerated the procedure well with no immediate complications.

## 2024-04-20 NOTE — Consult Note (Signed)
 [facility]    Date of Admission:  04/19/2024     Total days of antibiotics 2               Reason for Consult: MSSA bacteremia   Referring Provider: Sharyon / Auto consult  Primary Care Provider: Redmon, Noelle, PA   ASSESSMENT:  Luis Ferguson is a 63 year old Caucasian male presenting to the hospital with worsening cough, congestion, and fever and found to have a positive coronavirus H KU 1 viral infection complicated by MSSA bacteremia and acute respiratory failure requiring intubation.  Also found to have left leg ischemia with concern for acute critical limb ischemia s/p vascular surgery intervention.  Discontinue vancomycin  and start cefazolin . Check blood cultures for clearance of bacteremia. Echocardiogram pending and will need TEE in the setting of aortic valve replacement and MSSA bacteremia when appropriate. Will need line holiday when appropriate per PCCM. Droplet precautions. Remaining medical and supportive care per PCCM.   PLAN:  Change antibiotics to cefazolin . Blood cultures for clearance of bacteremia. TTE pending and will need to be in the presence of aortic valve replacement and MSSA bacteremia. Acute critical limb ischemia per vascular surgery. Droplet precautions for coronavirus H KU 1 Remaining medical and supportive care per PCCM.   Principal Problem:   AKI (acute kidney injury) Active Problems:   Aortic stenosis   Dyslipidemia   Lupus (systemic lupus erythematosus) (HCC)   Aortic valve regurgitation   S/P AVR   Symptomatic anemia   GERD (gastroesophageal reflux disease)   Perivalvular leak of prosthetic heart valve   Benign prostatic hyperplasia without urinary obstruction   Chronic kidney disease due to hypertension   Acute hypoxic respiratory failure (HCC)   MSSA bacteremia   Coronavirus infection    sodium chloride    Intravenous Once   atorvastatin   20 mg Per Tube Daily   Chlorhexidine  Gluconate Cloth  6 each  Topical Daily   insulin  aspart  0-9 Units Subcutaneous Q4H   pantoprazole  (PROTONIX ) IV  40 mg Intravenous Q12H   sodium chloride  flush  3 mL Intravenous Q12H   tamsulosin   0.4 mg Oral QPC breakfast     HPI: Luis Ferguson is a 63 y.o. male with previous medical history of aortic stenosis status post aortic valve replacement, lupus, and connective tissue disease presenting to the ED with worsening cold symptoms x 1 week including cough and congestion.  Luis Ferguson presented to the ED with approximately 4-day history of productive cough, congestion, nausea and vomiting with fevers at home and generalized weakness.  Febrile with temperature of 104.1 F and no leukocytosis.  Chest x-ray with low lung volumes and minimal bibasilar atelectasis.  CT chest with no acute cardiopulmonary process.  CT abdomen/pelvis with no acute findings in the abdomen/pelvis.  Respiratory panel positive for coronavirus HKU1.  Became tachypneic and was intubated for acute hypoxic respiratory failure.  Was started on broad-spectrum antibiotics with vancomycin , metronidazole , and cefepime .  Blood cultures positive for MSSA.  Course complicated by findings of left foot ischemia seen by vascular surgery with concern for acute limb ischemia likely related to left leg arterial inclusion with plan for emergent OR intervention.  Review of Systems: Review of Systems  Unable to perform ROS: Intubated     Past Medical History:  Diagnosis Date   Anemia    Aortic stenosis    Arthritis    achiness in shoulder , elbows   Connective tissue disease    followed  by Dr. Leni, [facility], pt. off Prednisone  since mid 2017   Fatigue    Headache    Lupus (systemic lupus erythematosus) (HCC)    Paravalvular leak (prosthetic valve), initial encounter 09/17/2017   Perivalvular leak of prosthetic heart valve    PONV (postoperative nausea and vomiting)    Psoriasis    below the knee on both legs    Wears glasses     Social History    Tobacco Use   Smoking status: Never   Smokeless tobacco: Never  Vaping Use   Vaping status: Never Used  Substance Use Topics   Alcohol  use: Yes    Comment: rare   Drug use: No    Family History  Problem Relation Age of Onset   Diabetes Mother        also HTN   CAD Father 61       CABGx3   Dementia Father    Liver cancer Brother    Diabetes Brother     Allergies  Allergen Reactions   Ace Inhibitors Cough   Codeine Itching and Rash    OBJECTIVE: Blood pressure 95/71, pulse 83, temperature (!) 96.8 F (36 C), resp. rate 18, height 5' 7.2 (1.707 m), weight 64 kg, SpO2 100%.  Physical Exam Constitutional:      General: He is not in acute distress.    Appearance: He is well-developed.     Interventions: He is sedated and intubated.  Cardiovascular:     Rate and Rhythm: Normal rate and regular rhythm.     Heart sounds: Normal heart sounds.  Pulmonary:     Effort: Pulmonary effort is normal. He is intubated.     Breath sounds: Normal breath sounds.  Skin:    General: Skin is warm and dry.  Neurological:     Mental Status: He is oriented to person, place, and time.     Lab Results Lab Results  Component Value Date   WBC 8.6 04/20/2024   HGB 9.0 (L) 04/20/2024   HCT 25.5 (L) 04/20/2024   MCV 83.1 04/20/2024   PLT 26 (LL) 04/20/2024    Lab Results  Component Value Date   CREATININE 3.61 (H) 04/20/2024   BUN 110 (H) 04/20/2024   NA 138 04/20/2024   K 3.7 04/20/2024   CL 107 04/20/2024   CO2 15 (L) 04/20/2024    Lab Results  Component Value Date   ALT 103 (H) 04/20/2024   AST 223 (H) 04/20/2024   ALKPHOS 95 04/20/2024   BILITOT 1.2 04/20/2024     Microbiology: Recent Results (from the past 240 hours)  Resp panel by RT-PCR (RSV, Flu A&B, Covid) Anterior Nasal Swab     Status: None   Collection Time: 04/19/24 12:19 PM   Specimen: Anterior Nasal Swab  Result Value Ref Range Status   SARS Coronavirus 2 by RT PCR NEGATIVE NEGATIVE Final    Influenza A by PCR NEGATIVE NEGATIVE Final   Influenza B by PCR NEGATIVE NEGATIVE Final    Comment: (NOTE) The Xpert Xpress SARS-CoV-2/FLU/RSV plus assay is intended as an aid in the diagnosis of influenza from Nasopharyngeal swab specimens and should not be used as a sole basis for treatment. Nasal washings and aspirates are unacceptable for Xpert Xpress SARS-CoV-2/FLU/RSV testing.  Fact Sheet for Patients: BloggerCourse.com  Fact Sheet for Healthcare Providers: SeriousBroker.it  This test is not yet approved or cleared by the [place]  FDA and has been authorized for detection and/or diagnosis of SARS-CoV-2 by FDA  under an Emergency Use Authorization (EUA). This EUA will remain in effect (meaning this test can be used) for the duration of the COVID-19 declaration under Section 564(b)(1) of the Act, 21 U.S.C. section 360bbb-3(b)(1), unless the authorization is terminated or revoked.     Resp Syncytial Virus by PCR NEGATIVE NEGATIVE Final    Comment: (NOTE) Fact Sheet for Patients: BloggerCourse.com  Fact Sheet for Healthcare Providers: SeriousBroker.it  This test is not yet approved or cleared by the [place]  FDA and has been authorized for detection and/or diagnosis of SARS-CoV-2 by FDA under an Emergency Use Authorization (EUA). This EUA will remain in effect (meaning this test can be used) for the duration of the COVID-19 declaration under Section 564(b)(1) of the Act, 21 U.S.C. section 360bbb-3(b)(1), unless the authorization is terminated or revoked.  Performed at [facility], [address] [place], [place] [address]   Respiratory (~20 pathogens) panel by PCR     Status: Abnormal   Collection Time: 04/19/24 12:19 PM   Specimen: Nasopharyngeal Swab; Respiratory  Result Value Ref Range Status   Adenovirus NOT DETECTED NOT DETECTED Final   Coronavirus  229E NOT DETECTED NOT DETECTED Final    Comment: (NOTE) The Coronavirus on the Respiratory Panel, DOES NOT test for the novel  Coronavirus (2019 nCoV)    Coronavirus HKU1 DETECTED (A) NOT DETECTED Final   Coronavirus NL63 NOT DETECTED NOT DETECTED Final   Coronavirus OC43 NOT DETECTED NOT DETECTED Final   Metapneumovirus NOT DETECTED NOT DETECTED Final   Rhinovirus / Enterovirus NOT DETECTED NOT DETECTED Final   Influenza A NOT DETECTED NOT DETECTED Final   Influenza B NOT DETECTED NOT DETECTED Final   Parainfluenza Virus 1 NOT DETECTED NOT DETECTED Final   Parainfluenza Virus 2 NOT DETECTED NOT DETECTED Final   Parainfluenza Virus 3 NOT DETECTED NOT DETECTED Final   Parainfluenza Virus 4 NOT DETECTED NOT DETECTED Final   Respiratory Syncytial Virus NOT DETECTED NOT DETECTED Final   Bordetella pertussis NOT DETECTED NOT DETECTED Final   Bordetella Parapertussis NOT DETECTED NOT DETECTED Final   Chlamydophila pneumoniae NOT DETECTED NOT DETECTED Final   Mycoplasma pneumoniae NOT DETECTED NOT DETECTED Final    Comment: Performed at [facility], [address], [place], [place] [address]  Culture, blood (single)     Status: None (Preliminary result)   Collection Time: 04/19/24 11:20 PM   Specimen: BLOOD  Result Value Ref Range Status   Specimen Description BLOOD SITE NOT SPECIFIED  Final   Special Requests   Final    BOTTLES DRAWN AEROBIC AND ANAEROBIC Blood Culture results may not be optimal due to an inadequate volume of blood received in culture bottles   Culture  Setup Time   Final    GRAM POSITIVE COCCI IN CLUSTERS IN BOTH AEROBIC AND ANAEROBIC BOTTLES CRITICAL VALUE NOTED.  VALUE IS CONSISTENT WITH PREVIOUSLY REPORTED AND CALLED VALUE. Performed at [facility], [address] [place], [place] [address]    Culture GRAM POSITIVE COCCI  Final   Report Status PENDING  Incomplete  Culture, blood (Routine X 2) w Reflex to ID Panel     Status: None (Preliminary result)    Collection Time: 04/20/24 12:15 AM   Specimen: BLOOD RIGHT ARM  Result Value Ref Range Status   Specimen Description BLOOD RIGHT ARM  Final   Special Requests   Final    BOTTLES DRAWN AEROBIC AND ANAEROBIC Blood Culture adequate volume   Culture  Setup Time   Final    GRAM POSITIVE COCCI IN CLUSTERS IN BOTH AEROBIC AND ANAEROBIC BOTTLES CRITICAL RESULT CALLED TO, READ BACK BY AND VERIFIED WITH: PHARMD A JAMINEZ [id number] AT 950 AM BY CM Performed at [facility], [address] [place], [place] [address]    Culture GRAM POSITIVE COCCI  Final   Report Status PENDING  Incomplete  Blood Culture ID Panel (Reflexed)     Status: Abnormal   Collection Time: 04/20/24 12:15 AM  Result Value Ref Range Status   Enterococcus faecalis NOT DETECTED NOT DETECTED Final   Enterococcus Faecium NOT DETECTED NOT DETECTED Final   Listeria monocytogenes NOT DETECTED NOT DETECTED Final   Staphylococcus species DETECTED (A) NOT DETECTED Final    Comment: CRITICAL RESULT CALLED TO, READ BACK BY AND VERIFIED WITH: PHARMD A JAMINEZ [id number] AT 950 AM BY CM    Staphylococcus aureus (BCID) DETECTED (A) NOT DETECTED Final    Comment: CRITICAL RESULT CALLED TO, READ BACK BY AND VERIFIED WITH: PHARMD A JAMINEZ [id number] AT 950 AM BY CM    Staphylococcus epidermidis NOT DETECTED NOT DETECTED Final   Staphylococcus lugdunensis NOT DETECTED NOT DETECTED Final   Streptococcus species NOT DETECTED NOT DETECTED Final   Streptococcus agalactiae NOT DETECTED NOT DETECTED Final   Streptococcus pneumoniae NOT DETECTED NOT DETECTED Final   Streptococcus pyogenes NOT DETECTED NOT DETECTED Final   A.calcoaceticus-baumannii NOT DETECTED NOT DETECTED Final   Bacteroides fragilis NOT DETECTED NOT DETECTED Final   Enterobacterales NOT DETECTED NOT DETECTED Final   Enterobacter cloacae complex NOT DETECTED NOT DETECTED Final   Escherichia coli NOT DETECTED NOT DETECTED Final   Klebsiella aerogenes NOT DETECTED NOT DETECTED  Final   Klebsiella oxytoca NOT DETECTED NOT DETECTED Final   Klebsiella pneumoniae NOT DETECTED NOT DETECTED Final   Proteus species NOT DETECTED NOT DETECTED Final   Salmonella species NOT DETECTED NOT DETECTED Final   Serratia marcescens NOT DETECTED NOT DETECTED Final   Haemophilus influenzae NOT DETECTED NOT DETECTED Final   Neisseria meningitidis NOT DETECTED NOT DETECTED Final   Pseudomonas aeruginosa NOT DETECTED NOT DETECTED Final   Stenotrophomonas maltophilia NOT DETECTED NOT DETECTED Final   Candida albicans NOT DETECTED NOT DETECTED Final   Candida auris NOT DETECTED NOT DETECTED Final   Candida glabrata NOT DETECTED NOT DETECTED Final   Candida krusei NOT DETECTED NOT DETECTED Final   Candida parapsilosis NOT DETECTED NOT DETECTED Final   Candida tropicalis NOT DETECTED NOT DETECTED Final   Cryptococcus neoformans/gattii NOT DETECTED NOT DETECTED Final   Meth resistant mecA/C and MREJ NOT DETECTED NOT DETECTED Final    Comment: Performed at [facility], [address], [place], [place] [address]  MRSA Next Gen by PCR, Nasal     Status: None   Collection Time: 04/20/24  1:06 AM   Specimen: Nasal Mucosa; Nasal Swab  Result Value Ref Range Status   MRSA by PCR Next Gen NOT DETECTED NOT DETECTED Final    Comment: (NOTE) The GeneXpert MRSA Assay (FDA approved for NASAL specimens only), is one component of a comprehensive MRSA colonization surveillance program. It is not intended to diagnose MRSA infection nor to guide or monitor treatment for MRSA infections. Test performance is not FDA approved in patients less than 75 years old. Performed at [facility], [address], [place], [place] [address]      Cathlyn July, NP [facility]  04/20/2024  3:25 PM

## 2024-04-20 NOTE — Consult Note (Signed)
 NAME:  Luis Ferguson, MRN:  [id number], DOB:  [birth year], LOS: 1 ADMISSION DATE:  04/19/2024, CONSULTATION DATE:  04/20/2024 REFERRING MD:  Redia Cleaver, MD, CHIEF COMPLAINT:  N/V  History of Present Illness:  63 y/o male with PMH for Lupus, CKD, Anemia, Aortic Valve regurg and stenosis s/p replacement, Psoriatic arthritis, BPH, GERD, CAD, Dementia who presented with N/V x 4 days, worsening with fevers and weakness along with abdominal pain.  He was admitted to Hospitalist service.  At that time his O2 was in the 90's on RA and his BP low was SBP 90's.  CT abdomen and chest were relatively benign.  However, he became very tachypnic and was intubated.  His SBP as low as 50's in ED.  He was given Neostick and remained low.  Levophed  to be started.  Dark fluid from OGT possible blood.  Pertinent  Medical History  Lupus, CKD, Anemia, Aortic Valve regurg and stenosis s/p replacement, Psoriatic arthritis, BPH, GERD, CAD, Dementia   Significant Hospital Events: Including procedures, antibiotic start and stop dates in addition to other pertinent events   9/24: admitted to hospitalist service  Interim History / Subjective:  9/24: worsening SOB and intubated in ED 9/25: ICU consult post intubation  Objective    Blood pressure (!) 67/55, pulse (!) 107, temperature (!) 104.1 F (40.1 C), resp. rate 18, height 5' 7.2 (1.707 m), weight 64 kg, SpO2 100%.    Vent Mode: PRVC FiO2 (%):  [100 %] 100 % Set Rate:  [18 bmp] 18 bmp Vt Set:  [530 mL] 530 mL PEEP:  [5 cmH20] 5 cmH20 Plateau Pressure:  [16 cmH20] 16 cmH20  No intake or output data in the 24 hours ending 04/20/24 0048 Filed Weights   04/19/24 2348  Weight: 64 kg    Examination: General: sedated, hypotensive HENT: pupils round and equal, no icterus  Lungs: CTA no creps or wheezes Cardiovascular: reg s1s2 no murmurs or rubs Abdomen: soft nt nd bs pos Extremities: left foot cold and no pulses, right foot warm and dopplerable  pulses, no edema Neuro: Sedated on mech vent GU: Foley  Resolved problem list   Assessment and Plan  Acute hypoxic respiratory failure S/p intubation, Peak and mean pressures not elevated Mech vent management VAP prevention SBT when appropriate Shock-likely septic Starting Levophed  Broad spectrum antibiotics Severe sepsis Pressors and antibiotics and IV fluids Possible GI bleed Guiac oralgastric tube contents Left foot ischemia Vascular called for consult Will start Heparin  drip AKI on CKD unspecified Monitor I's/O's Monitor serum Cr UTI Broad spectrum antibiotics CAD by history Check troponins   Labs   CBC: Recent Labs  Lab 04/19/24 1219 04/19/24 2327  WBC 9.2  --   NEUTROABS 8.8*  --   HGB 10.8* 9.9*  HCT 32.0* 29.0*  MCV 84.2  --   PLT 49*  --     Basic Metabolic Panel: Recent Labs  Lab 04/19/24 1219 04/19/24 2327  NA 132* 137  K 3.8 3.6  CL 97*  --   CO2 19*  --   GLUCOSE 106*  --   BUN 95*  --   CREATININE 3.44*  --   CALCIUM  8.0*  --    GFR: Estimated Creatinine Clearance: 19.6 mL/min (A) (by C-G formula based on SCr of 3.44 mg/dL (H)). Recent Labs  Lab 04/19/24 1219 04/19/24 1246 04/19/24 1411 04/19/24 2328 04/20/24 0030  WBC 9.2  --   --   --   --   LATICACIDVEN  --  3.6* 3.4* 2.7* 3.4*    Liver Function Tests: Recent Labs  Lab 04/19/24 1219  AST 160*  ALT 98*  ALKPHOS 140*  BILITOT 1.2  PROT 6.3*  ALBUMIN  2.5*   Recent Labs  Lab 04/19/24 1219  LIPASE 19   No results for input(s): AMMONIA in the last 168 hours.  ABG    Component Value Date/Time   PHART 7.400 09/17/2017 2102   PCO2ART 43.3 09/17/2017 2102   PO2ART 174.0 (H) 09/17/2017 2102   HCO3 16.9 (L) 04/19/2024 2327   TCO2 18 (L) 04/19/2024 2327   ACIDBASEDEF 6.0 (H) 04/19/2024 2327   O2SAT 87 04/19/2024 2327     Coagulation Profile: No results for input(s): INR, PROTIME in the last 168 hours.  Cardiac Enzymes: No results for input(s):  CKTOTAL, CKMB, CKMBINDEX, TROPONINI in the last 168 hours.  HbA1C: Hgb A1c MFr Bld  Date/Time Value Ref Range Status  09/16/2017 09:01 AM 4.1 (L) 4.8 - 5.6 % Final    Comment:    (NOTE) Pre diabetes:          5.7%-6.4% Diabetes:              >6.4% Glycemic control for   <7.0% adults with diabetes   07/09/2017 01:12 PM 5.6 4.8 - 5.6 % Final    Comment:    (NOTE)         Prediabetes: 5.7 - 6.4         Diabetes: >6.4         Glycemic control for adults with diabetes: <7.0     CBG: Recent Labs  Lab 04/19/24 2321  GLUCAP 105*    Review of Systems:   Intubated, unable to do  Past Medical History:  He,  has a past medical history of Anemia, Aortic stenosis, Arthritis, Connective tissue disease, Fatigue, Headache, Lupus (systemic lupus erythematosus) (HCC), Paravalvular leak (prosthetic valve), initial encounter (09/17/2017), Perivalvular leak of prosthetic heart valve, PONV (postoperative nausea and vomiting), Psoriasis, and Wears glasses.   Surgical History:   Past Surgical History:  Procedure Laterality Date   AORTIC VALVE REPLACEMENT  06/2017   AORTIC VALVE REPLACEMENT N/A 09/17/2017   Procedure: REDO AORTIC VALVE REPLACEMENT (AVR);  Surgeon: Kerrin Elspeth BROCKS, MD;  Location: [facility] OR;  Service: Open Heart Surgery;  Laterality: N/A;   CARDIAC CATHETERIZATION     HERNIA REPAIR  2016   left and right   RIGHT/LEFT HEART CATH AND CORONARY ANGIOGRAPHY N/A 06/16/2017   Procedure: RIGHT/LEFT HEART CATH AND CORONARY ANGIOGRAPHY;  Surgeon: Claudene Victory ORN, MD;  Location: [facility] INVASIVE CV LAB;  Service: Cardiovascular;  Laterality: N/A;   TEE WITHOUT CARDIOVERSION N/A 07/12/2017   Procedure: TRANSESOPHAGEAL ECHOCARDIOGRAM (TEE);  Surgeon: Kerrin Elspeth BROCKS, MD;  Location: [facility] OR;  Service: Open Heart Surgery;  Laterality: N/A;   TEE WITHOUT CARDIOVERSION N/A 09/09/2017   Procedure: TRANSESOPHAGEAL ECHOCARDIOGRAM (TEE);  Surgeon: Francyne Headland, MD;  Location: [facility] ENDOSCOPY;   Service: Cardiovascular;  Laterality: N/A;   TEE WITHOUT CARDIOVERSION N/A 09/17/2017   Procedure: TRANSESOPHAGEAL ECHOCARDIOGRAM (TEE);  Surgeon: Kerrin Elspeth BROCKS, MD;  Location: [facility] OR;  Service: Open Heart Surgery;  Laterality: N/A;     Social History:   reports that he has never smoked. He has never used smokeless tobacco. He reports current alcohol  use. He reports that he does not use drugs.   Family History:  His family history includes CAD (age of onset: 53) in his father; Dementia in his father; Diabetes in his brother and  mother; Liver cancer in his brother.   Allergies Allergies  Allergen Reactions   Ace Inhibitors Cough   Codeine Itching and Rash     Home Medications  Prior to Admission medications   Medication Sig Start Date End Date Taking? Authorizing Provider  acetaminophen  (TYLENOL ) 650 MG CR tablet Take 1,300 mg by mouth every 8 (eight) hours as needed for pain.   Yes [provider]  atorvastatin  (LIPITOR) 20 MG tablet Take 20 mg by mouth daily.  11/25/12  Yes [provider]  calcium  carbonate (TUMS EX) 750 MG chewable tablet Chew 1 tablet by mouth daily.   Yes [provider]  Cholecalciferol  (VITAMIN D ) 50 MCG (2000 UT) tablet Take 2,000 Units by mouth daily.   Yes [provider]  Dextromethorphan-guaiFENesin (MUCINEX DM PO) Take 1 tablet by mouth as needed.   Yes [provider]  diphenhydrAMINE  HCl (BENADRYL  ALLERGY PO) Take 0.5 tablets by mouth as needed.   Yes [provider]  Famotidine  (PEPCID  PO) Take 1 tablet by mouth as needed.   Yes [provider]  fexofenadine (ALLEGRA) 60 MG tablet Take 60 mg by mouth daily.   Yes [provider]  hydroxychloroquine  (PLAQUENIL ) 200 MG tablet Take 400 mg by mouth daily.  03/03/13  Yes [provider]  metoprolol  tartrate (LOPRESSOR ) 50 MG tablet TAKE 1 TABLET TWICE A DAY (PLEASE SCHEDULE OFFICE VISIT FOR REFILLS) Patient taking  differently: Take 50 mg by mouth 2 (two) times daily. (PLEASE SCHEDULE OFFICE VISIT FOR REFILLS) 01/10/21  Yes Hilty, Vinie BROCKS, MD  Omega-3 Fatty Acids (FISH OIL) 1000 MG CAPS Take 2 capsules by mouth daily.   Yes [provider]  TALTZ 80 MG/ML pen Inject 80 mg into the skin every 28 (twenty-eight) days. 07/04/21  Yes [provider]  tamsulosin  (FLOMAX ) 0.4 MG CAPS capsule Take 0.4 mg by mouth daily after breakfast.    Yes [provider]  COSENTYX SENSOREADY PEN 150 MG/ML SOAJ Inject 150 mg into the skin every 30 (thirty) days. Patient not taking: Reported on 04/19/2024 11/09/18   [provider]     Critical care time: 70   The patient is critically ill with multiple organ system failure and requires high complexity decision making for assessment and support, frequent evaluation and titration of therapies, advanced monitoring, review of radiographic studies and interpretation of complex data.   Critical Care Time devoted to patient care services, exclusive of separately billable procedures, described in this note is 35 minutes.   Orlin Fairly, MD [facility] See Amion for pager  If no response to pager , please call [phone] until 7pm After 7:00 pm call Elink  [phone] 04/20/2024, 12:49 AM

## 2024-04-20 NOTE — Consult Note (Signed)
 [facility] [place]  ASSESSMENT / PLAN: 63 y.o. male with left leg acute limb ischemia. Critically ill with shock of unclear origin, AKI, LFT elevation, respiratory failure requiring intubation in ER.  Unclear timing of left leg arterial occlusion. Suspect cardioembolic source. Cannot get CT angiogram because of AKI. Bedside ultrasound suggests popliteal occlusion. No antecedent symptoms when discussing with wife.   Will plan to proceed to OR emergently given inability to evaluate the neurologic function of the left foot. Plan left leg arterial thrombectomy from popliteal approach.  Counseled wife about patient's critical condition and limb threatening emergency. She is understanding.   CHIEF COMPLAINT: productive cough  HISTORY OF PRESENT ILLNESS: Luis Ferguson is a 63 y.o. male admitted to the critical care service for treatment of sepsis, AKI, altered mental status without clear etiology.  His history significant for lupus, aortic valve replacement, among other problems.  Who presented to the emergency department today with nausea, vomiting, fevers, weakness, upper respiratory complaints.  He was found to be hypotensive and in respiratory distress in the emergency department.  He was ultimately intubated.  I was called to evaluate his left lower extremity early this morning when no Doppler flow was identified.  It is unclear when this began, but the patient's wife reports no left leg symptoms in the preceding days.  On my evaluation, the patient is intubated and sedated.  He is not able to participate in a neurologic exam.   Past Medical History:  Diagnosis Date   Anemia    Aortic stenosis    Arthritis    achiness in shoulder , elbows   Connective tissue disease    followed by Dr. Leni, [facility], pt. off Prednisone  since mid 2017   Fatigue    Headache    Lupus (systemic lupus erythematosus) (HCC)    Paravalvular leak (prosthetic valve), initial encounter  09/17/2017   Perivalvular leak of prosthetic heart valve    PONV (postoperative nausea and vomiting)    Psoriasis    below the knee on both legs    Wears glasses     Past Surgical History:  Procedure Laterality Date   AORTIC VALVE REPLACEMENT  06/2017   AORTIC VALVE REPLACEMENT N/A 09/17/2017   Procedure: REDO AORTIC VALVE REPLACEMENT (AVR);  Surgeon: Kerrin Elspeth BROCKS, MD;  Location: [facility] OR;  Service: Open Heart Surgery;  Laterality: N/A;   CARDIAC CATHETERIZATION     HERNIA REPAIR  2016   left and right   RIGHT/LEFT HEART CATH AND CORONARY ANGIOGRAPHY N/A 06/16/2017   Procedure: RIGHT/LEFT HEART CATH AND CORONARY ANGIOGRAPHY;  Surgeon: Claudene Victory ORN, MD;  Location: [facility] INVASIVE CV LAB;  Service: Cardiovascular;  Laterality: N/A;   TEE WITHOUT CARDIOVERSION N/A 07/12/2017   Procedure: TRANSESOPHAGEAL ECHOCARDIOGRAM (TEE);  Surgeon: Kerrin Elspeth BROCKS, MD;  Location: [facility] OR;  Service: Open Heart Surgery;  Laterality: N/A;   TEE WITHOUT CARDIOVERSION N/A 09/09/2017   Procedure: TRANSESOPHAGEAL ECHOCARDIOGRAM (TEE);  Surgeon: Francyne Headland, MD;  Location: [facility] ENDOSCOPY;  Service: Cardiovascular;  Laterality: N/A;   TEE WITHOUT CARDIOVERSION N/A 09/17/2017   Procedure: TRANSESOPHAGEAL ECHOCARDIOGRAM (TEE);  Surgeon: Kerrin Elspeth BROCKS, MD;  Location: [facility] OR;  Service: Open Heart Surgery;  Laterality: N/A;    Family History  Problem Relation Age of Onset   Diabetes Mother        also HTN   CAD Father 75       CABGx3   Dementia Father    Liver cancer Brother  Diabetes Brother     Social History   Socioeconomic History   Marital status: Married    Spouse name: Not on file   Number of children: Not on file   Years of education: Not on file   Highest education level: Not on file  Occupational History   Not on file  Tobacco Use   Smoking status: Never   Smokeless tobacco: Never  Vaping Use   Vaping status: Never Used  Substance and Sexual Activity   Alcohol  use: Yes     Comment: rare   Drug use: No   Sexual activity: Yes  Other Topics Concern   Not on file  Social History Narrative   Not on file   Social Drivers of Health   Financial Resource Strain: Not on file  Food Insecurity: Not on file  Transportation Needs: Not on file  Physical Activity: Not on file  Stress: Not on file  Social Connections: Not on file  Intimate Partner Violence: Not on file    Allergies  Allergen Reactions   Ace Inhibitors Cough   Codeine Itching and Rash    Current Facility-Administered Medications  Medication Dose Route Frequency Provider Last Rate Last Admin   0.9 %  sodium chloride  infusion   Intravenous Continuous Seena Marsa NOVAK, MD   Stopped at 04/19/24 2350   0.9 %  sodium chloride  infusion  250 mL Intravenous Continuous Maree Harder, MD       0.9 %  sodium chloride  infusion   Intravenous Continuous Maree Harder, MD       0.9 %  sodium chloride  infusion  250 mL Intravenous Continuous Maree Harder, MD       acetaminophen  (OFIRMEV ) IV 1,000 mg  1,000 mg Intravenous Once Mesner, Selinda, MD 400 mL/hr at 04/20/24 0231 1,000 mg at 04/20/24 0231   acetaminophen  (TYLENOL ) tablet 650 mg  650 mg Oral Q6H PRN Seena Marsa NOVAK, MD       Or   acetaminophen  (TYLENOL ) suppository 650 mg  650 mg Rectal Q6H PRN Seena Marsa NOVAK, MD       atorvastatin  (LIPITOR) tablet 20 mg  20 mg Oral Daily Melvin, Alexander B, MD       ceFEPIme  (MAXIPIME ) 2 g in sodium chloride  0.9 % 100 mL IVPB  2 g Intravenous Q24H Laron Agent, RPH 200 mL/hr at 04/20/24 0053 2 g at 04/20/24 0053   Chlorhexidine  Gluconate Cloth 2 % PADS 6 each  6 each Topical Daily Maree Harder, MD       fentaNYL  in NS (102mcg/ml) infusion-PREMIX  0-400 mcg/hr Intravenous Continuous Mesner, Selinda, MD 15 mL/hr at 04/20/24 0144 150 mcg/hr at 04/20/24 0144   insulin  aspart (novoLOG ) injection 0-9 Units  0-9 Units Subcutaneous Q4H Maree Harder, MD       lactated ringers  bolus 1,000 mL  1,000 mL  Intravenous Once Francis Ileana SAILOR, PA-C       And   lactated ringers  bolus 1,000 mL  1,000 mL Intravenous Once Francis Ileana SAILOR, PA-C       And   lactated ringers  bolus 250 mL  250 mL Intravenous Once Francis Ileana SAILOR, PA-C       lactated ringers  infusion   Intravenous Continuous Francis Ileana SAILOR, PA-C 150 mL/hr at 04/20/24 0101 New Bag at 04/20/24 0101   midazolam  (VERSED ) injection 2 mg  2 mg Intravenous Q1H PRN Mesner, Selinda, MD       norepinephrine  (LEVOPHED ) 4mg  in (0.016 mg/mL) premix infusion  0-10 mcg/min Intravenous Titrated Maree Harder, MD 37.5 mL/hr at 04/20/24 0142 10 mcg/min at 04/20/24 0142   ondansetron  (ZOFRAN ) injection 4 mg  4 mg Intravenous Q6H PRN Maree Harder, MD       pantoprazole  (PROTONIX ) injection 40 mg  40 mg Intravenous QHS Maree Harder, MD       polyethylene glycol (MIRALAX  / GLYCOLAX ) packet 17 g  17 g Oral Daily PRN Seena Marsa NOVAK, MD       sodium chloride  flush (NS) 0.9 % injection 3 mL  3 mL Intravenous Q12H Seena Marsa NOVAK, MD   3 mL at 04/20/24 0102   tamsulosin  (FLOMAX ) capsule 0.4 mg  0.4 mg Oral QPC breakfast Seena Marsa NOVAK, MD       vancomycin  (VANCOREADY) IVPB 1500 mg/300 mL  1,500 mg Intravenous Once Wyland, James, [facility]       vancomycin  variable dose per unstable renal function (pharmacist dosing)   Does not apply See admin instructions Laron Agent, [facility]        PHYSICAL EXAM Vitals:   04/20/24 0205 04/20/24 0210 04/20/24 0215 04/20/24 0220  BP: (!) 143/75 134/82 132/78 121/84  Pulse: (!) 103 (!) 107 (!) 108 (!) 110  Resp: 20 18 18 20   Temp: 99.5 F (37.5 C) 99.3 F (37.4 C) 99.1 F (37.3 C) 99.1 F (37.3 C)  TempSrc:      SpO2: 100% 100% 100% 100%  Weight:      Height:       Critically ill man.  Intubated and sedated. Sinus tachycardia. Mechanically intubated. Abdomen soft and nontender 2+ femoral pulses bilaterally 2+ right dorsalis pedis pulse Absent left pedal pulses.  Absent Doppler flow in the left foot.  Left foot  cold.  Left calf is soft Not able to perform a neurologic exam because of intubation and sedation  Bedside ultrasound shows occlusion of the left popliteal artery. The SFA appears patent. The tibial vessels appear occluded.  PERTINENT LABORATORY AND RADIOLOGIC DATA  Most recent CBC    Latest Ref Rng & Units 04/20/2024    1:47 AM 04/20/2024   12:50 AM 04/20/2024   12:17 AM  CBC  WBC 4.0 - 10.5 K/uL 6.4   7.1   Hemoglobin 13.0 - 17.0 g/dL 7.3  8.2  8.8   Hematocrit 39.0 - 52.0 % 21.8  24.0  26.0   Platelets 150 - 400 K/uL 28   33      Most recent CMP    Latest Ref Rng & Units 04/20/2024    1:47 AM 04/20/2024   12:50 AM 04/20/2024   12:17 AM  CMP  Glucose 70 - 99 mg/dL 897   895   BUN 8 - 23 mg/dL 889   887   Creatinine 0.61 - 1.24 mg/dL 6.38   6.35   Sodium 864 - 145 mmol/L 136  138  136   Potassium 3.5 - 5.1 mmol/L 3.5  3.6  3.7   Chloride 98 - 111 mmol/L 107   106   CO2 22 - 32 mmol/L 15   15   Calcium  8.9 - 10.3 mg/dL 6.8   7.0   Total Protein 6.5 - 8.1 g/dL 4.3   4.9   Total Bilirubin 0.0 - 1.2 mg/dL 1.2   1.1   Alkaline Phos 38 - 126 U/L 95   112   AST 15 - 41 U/L 223   246   ALT 0 - 44 U/L 103   115  Renal function Estimated Creatinine Clearance: 18.7 mL/min (A) (by C-G formula based on SCr of 3.61 mg/dL (H)).  Hgb A1c MFr Bld (%)  Date Value  04/20/2024 6.1 (H)   Debby SAILOR. Magda, MD FACS [facility] Phone Number: [phone] 04/20/2024 2:33 AM   Total time spent on preparing this encounter including chart review, data review, collecting history, examining the patient, and coordinating care: 80 min  Portions of this report may have been transcribed using voice recognition software.  Every effort has been made to ensure accuracy; however, inadvertent computerized transcription errors may still be present.

## 2024-04-20 NOTE — Consult Note (Signed)
 NEUROLOGY CONSULT NOTE   Date of service: April 20, 2024 Patient Name: Luis Ferguson MRN:  [id number] DOB:  10-19-1960 Chief Complaint: Stroke on CT Requesting Provider: Kassie Acquanetta Bradley, MD  History of Present Illness  Luis Ferguson is a 63 y.o. male with hx of lupus, psoriasis who presented to the emergency department with abdominal pain, cough, congestion.  In the emergency department his mental status worsened and he was found to be septic with positive blood cultures for MSSA.  Due to worsening mental status and respiratory failure he was intubated.  He developed left foot ischemia and vascular surgery took him for thrombectomy.  He had pronounced thrombocytopenia felt to be due to sepsis.  Due to his poor mental status a head CT was obtained which demonstrates a large right hemispheric infarct.  LKW: Unclear, likely sometime in the emergency department Modified rankin score: 0-Completely asymptomatic and back to baseline post- stroke IV Thrombolysis: No, thrombocytopenia EVT: No, patient is unstable and stroke is already established  NIHSS components Score: Comment  1a Level of Conscious 0[]  1[]  2[]  3[x]      1b LOC Questions 0[]  1[]  2[x]       1c LOC Commands 0[]  1[]  2[x]       2 Best Gaze 0[x]  1[]  2[]       3 Visual 0[]  1[]  2[]  3[x]      4 Facial Palsy 0[]  1[x]  2[]  3[]      5a Motor Arm - left 0[]  1[]  2[]  3[]  4[x]  UN[]    5b Motor Arm - Right 0[]  1[]  2[]  3[x]  4[]  UN[]    6a Motor Leg - Left 0[]  1[]  2[]  3[]  4[x]  UN[]    6b Motor Leg - Right 0[]  1[]  2[]  3[x]  4[]  UN[]    7 Limb Ataxia 0[x]  1[]  2[]  UN[]      8 Sensory 0[]  1[x]  2[]  UN[]      9 Best Language 0[]  1[]  2[]  3[x]      10 Dysarthria 0[]  1[]  2[]  UN[x]      11 Extinct. and Inattention 0[x]  1[]  2[]     Unable to test  TOTAL: 29       Past History   Past Medical History:  Diagnosis Date   Anemia    Aortic stenosis    Arthritis    achiness in shoulder , elbows   Connective tissue disease    followed by Dr. Leni,  [facility]MA, pt. off Prednisone  since mid 2017   Fatigue    Headache    Lupus (systemic lupus erythematosus) (HCC)    Paravalvular leak (prosthetic valve), initial encounter 09/17/2017   Perivalvular leak of prosthetic heart valve    PONV (postoperative nausea and vomiting)    Psoriasis    below the knee on both legs    Wears glasses     Past Surgical History:  Procedure Laterality Date   AORTIC VALVE REPLACEMENT  06/2017   AORTIC VALVE REPLACEMENT N/A 09/17/2017   Procedure: REDO AORTIC VALVE REPLACEMENT (AVR);  Surgeon: Kerrin Elspeth BROCKS, MD;  Location: [facility] OR;  Service: Open Heart Surgery;  Laterality: N/A;   CARDIAC CATHETERIZATION     HERNIA REPAIR  2016   left and right   RIGHT/LEFT HEART CATH AND CORONARY ANGIOGRAPHY N/A 06/16/2017   Procedure: RIGHT/LEFT HEART CATH AND CORONARY ANGIOGRAPHY;  Surgeon: Claudene Victory ORN, MD;  Location: [facility] INVASIVE CV LAB;  Service: Cardiovascular;  Laterality: N/A;   TEE WITHOUT CARDIOVERSION N/A 07/12/2017   Procedure: TRANSESOPHAGEAL ECHOCARDIOGRAM (TEE);  Surgeon: Kerrin Elspeth BROCKS, MD;  Location: [facility] OR;  Service: Open Heart Surgery;  Laterality: N/A;   TEE WITHOUT CARDIOVERSION N/A 09/09/2017   Procedure: TRANSESOPHAGEAL ECHOCARDIOGRAM (TEE);  Surgeon: Francyne Headland, MD;  Location: [facility] ENDOSCOPY;  Service: Cardiovascular;  Laterality: N/A;   TEE WITHOUT CARDIOVERSION N/A 09/17/2017   Procedure: TRANSESOPHAGEAL ECHOCARDIOGRAM (TEE);  Surgeon: Kerrin Elspeth BROCKS, MD;  Location: [facility] OR;  Service: Open Heart Surgery;  Laterality: N/A;    Family History: Family History  Problem Relation Age of Onset   Diabetes Mother        also HTN   CAD Father 60       CABGx3   Dementia Father    Liver cancer Brother    Diabetes Brother     Social History  reports that he has never smoked. He has never used smokeless tobacco. He reports current alcohol  use. He reports that he does not use drugs.  Allergies  Allergen Reactions   Ace Inhibitors Cough    Codeine Itching and Rash    Medications   Current Facility-Administered Medications:    0.9 %  sodium chloride  infusion (Manually program via Guardrails IV Fluids), , Intravenous, Once, Eveland, Matthew, PA-C   0.9 %  sodium chloride  infusion, 250 mL, Intravenous, Continuous, Eveland, Matthew, PA-C, Stopped at 04/20/24 1006   0.9 %  sodium chloride  infusion, 250 mL, Intravenous, Continuous, Bethanie Cough, PA-C, Stopped at 04/20/24 0701   acetaminophen  (TYLENOL ) tablet 650 mg, 650 mg, Oral, Q6H PRN **OR** acetaminophen  (TYLENOL ) suppository 650 mg, 650 mg, Rectal, Q6H PRN, Eveland, Matthew, PA-C   atorvastatin  (LIPITOR) tablet 20 mg, 20 mg, Per Tube, Daily, Pham, Minh Q, RPH-CPP, 20 mg at 04/20/24 1036   ceFAZolin  (ANCEF ) IVPB 2g/100 mL premix, 2 g, Intravenous, Q12H, Vu, Trung T, MD   Chlorhexidine  Gluconate Cloth 2 % PADS 6 each, 6 each, Topical, Daily, Eveland, Matthew, PA-C, 6 each at 04/20/24 1033   insulin  aspart (novoLOG ) injection 0-9 Units, 0-9 Units, Subcutaneous, Q4H, Eveland, Matthew, PA-C, 1 Units at 04/20/24 1540   midazolam  (VERSED ) injection 2 mg, 2 mg, Intravenous, Q1H PRN, Eveland, Matthew, PA-C   norepinephrine  (LEVOPHED ) 4mg  in (0.016 mg/mL) premix infusion, 0-10 mcg/min, Intravenous, Titrated, Bethanie Cough, PA-C, Last Rate: 22.5 mL/hr at 04/20/24 1900, 6 mcg/min at 04/20/24 1900   ondansetron  (ZOFRAN ) injection 4 mg, 4 mg, Intravenous, Q6H PRN, Eveland, Matthew, PA-C   pantoprazole  (PROTONIX ) injection 40 mg, 40 mg, Intravenous, Q12H, Kassie Acquanetta Bradley, MD, 40 mg at 04/20/24 1009   polyethylene glycol (MIRALAX  / GLYCOLAX ) packet 17 g, 17 g, Oral, Daily PRN, Eveland, Matthew, PA-C   propofol  (DIPRIVAN ) 1000 MG/100ML infusion, 0-80 mcg/kg/min, Intravenous, Titrated, Ogan, Okoronkwo U, MD, Stopped at 04/20/24 1641   sodium chloride  (hypertonic) 3 % solution, , Intravenous, Continuous, Artemisia Auvil, Aisha SQUIBB, MD   sodium chloride  flush (NS) 0.9 % injection 3 mL, 3  mL, Intravenous, Q12H, Eveland, Matthew, PA-C, 3 mL at 04/20/24 1008   tamsulosin  (FLOMAX ) capsule 0.4 mg, 0.4 mg, Oral, QPC breakfast, Eveland, Matthew, PA-C   vasopressin  (PITRESSIN) 20 Units in 100 mL (0.2 unit/mL) infusion-*FOR SHOCK*, 0-0.03 Units/min, Intravenous, Continuous, Ogan, Okoronkwo U, MD, Stopped at 04/20/24 1239  Vitals   Vitals:   04/20/24 1700 04/20/24 1800 04/20/24 1900 04/20/24 2000  BP: 106/76 109/75 113/79 113/83  Pulse: 95 96 95 95  Resp: 18 18 18 17   Temp: 97.7 F (36.5 C) 98.2 F (36.8 C) 98.1 F (36.7 C) 98.1 F (36.7 C)  TempSrc:    Bladder  SpO2: 100% 100% 100% 100%  Weight:      Height:        Body mass index is 21.95 kg/m.   Physical Exam   Constitutional: Appears well-developed and well-nourished.   Neurologic Examination    Neuro: Mental Status: Patient is obtunded, does not open eyes or follow commands.  He does grimace minimally to noxious stimulation  Cranial Nerves: II: He does not blink to threat from either direction. Pupils are equal, round, and reactive to light.   III,IV, VI: Eyes are midline, doll's eye limited due to apparatus V:VII: Corneal is intact on the right, absent on the left X: Cough is present Motor: He does have some movement to noxious stimulation on the right, no movement on the left. Sensory: He withdraws to noxious stimulation on the right, and does appear to grimace slightly to nailbed pressure on the left but certainly not as much as the right Cerebellar: Does not perform       Labs/Imaging/Neurodiagnostic studies   CBC:  Recent Labs  Lab 04-28-2024 0017 2024-04-28 0050 04-28-2024 0806 04-28-2024 1400  WBC 7.1   < > 9.7 8.6  NEUTROABS 6.3  --  8.6*  --   HGB 8.8*   < > 8.7* 9.0*  HCT 26.0*   < > 25.3* 25.5*  MCV 84.7   < > 83.5 83.1  PLT 33*   < > 38* 26*   < > = values in this interval not displayed.   Basic Metabolic Panel:  Lab Results  Component Value Date   NA 138 2024/04/28   K 3.7  04-28-2024   CO2 15 (L) 04-28-24   GLUCOSE 102 (H) 04-28-24   BUN 110 (H) Apr 28, 2024   CREATININE 3.61 (H) 04/28/2024   CALCIUM  6.8 (L) April 28, 2024   GFRNONAA 18 (L) April 28, 2024   GFRAA >60 06/05/2019   Lipid Panel: No results found for: LDLCALC HgbA1c:  Lab Results  Component Value Date   HGBA1C 6.1 (H) 04/28/24    INR  Lab Results  Component Value Date   INR 1.8 (H) 04/28/24   APTT  Lab Results  Component Value Date   APTT 167 (HH) Apr 28, 2024    CT Head without contrast(Personally reviewed): Large right MCA territory stroke    ASSESSMENT   Luis Ferguson is a 63 y.o. male with large right MCA territory stroke in the setting of severe sepsis from bacteremia, ischemic limb, coagulopathy, and respiratory failure.  He is in a very tenuous situation, but given the size of his stroke I think he is at very high risk for hemorrhagic conversion especially in the setting of elevated INR and severe thrombocytopenia.  I would not favor continuing heparin  at this time.  Certainly with bacteremia, endocarditis with septic emboli are very likely possibility and these are more likely to bleed than other kinds of strokes.  He does have a history of lupus, so antiphospholipid antibody syndrome could be another consideration.  Unfortunately, with his severe thrombocytopenia, I do not anticipate that he would be a candidate for hemicraniectomy even if his swelling reaches the point of being immediately life-threatening.  I discussed with his wife that in a best case scenario he was likely to have severe left hemiparesis and require significant assistance, but that there was a potential, if the stroke that we see is the only cerebral insult, that he may be able to talk and have a conversation.  I described to her the risk of swelling and that especially coupled with his overall picture, I  think it was very possible that the patient could pass away with this hospital admission.  I asked  her in that setting, whether he would want resuscitation in the setting of a cardiac arrest on top of the already described comorbidities, and she stated that he would not want to have chest compressions in that setting which I think is a very reasonable approach.  RECOMMENDATIONS  Continue telemetry, echocardiogram MRI brain, MRA head and neck Start 3% hypertonic saline at 75 mL/h Frequent neurochecks Antiphospholipid antibody I changed CODE STATUS to DNR in the setting of cardiac arrest, with continued support up to the point of cardiac arrest If he survives, he will need PT, OT, ST Stroke team to follow  ______________________________________________________________________   This patient is critically ill and at significant risk of neurological worsening, death and care requires constant monitoring of vital signs, hemodynamics,respiratory and cardiac monitoring, neurological assessment, discussion with family, other specialists and medical decision making of high complexity. I spent 65 minutes of neurocritical care time  in the care of  this patient. This was time spent independent of any time provided by nurse practitioner or PA.  Aisha Seals, MD [facility]   If 7pm- 7am, please page neurology on call as listed in AMION. 04/20/2024  8:47 PM

## 2024-04-20 NOTE — Anesthesia Procedure Notes (Signed)
 Central Venous Catheter Insertion Performed by: Epifanio Charleston, MD, anesthesiologist Start/End9/25/2025 3:40 AM, 04/20/2024 3:50 AM Patient location: Pre-op. Preanesthetic checklist: patient identified, IV checked, site marked, risks and benefits discussed, surgical consent, monitors and equipment checked, pre-op evaluation, timeout performed and anesthesia consent Position: Trendelenburg Lidocaine  1% used for infiltration and patient sedated Hand hygiene performed , maximum sterile barriers used  and Seldinger technique used Catheter size: 9 Fr Total catheter length 10. Central line was placed.MAC introducer Swan type:thermodilution Procedure performed using ultrasound guided technique. Ultrasound Notes:anatomy identified, needle tip was noted to be adjacent to the nerve/plexus identified, no ultrasound evidence of intravascular and/or intraneural injection and image(s) printed for medical record Attempts: 1 Following insertion, line sutured, dressing applied and Biopatch. Post procedure assessment: blood return through all ports, free fluid flow and no air  Patient tolerated the procedure well with no immediate complications. Additional procedure comments: Triple lumen access catheter inserted through intoducer port. All lines aspirated and flushed with saline.SABRA

## 2024-04-20 NOTE — Progress Notes (Signed)
 PHARMACY - ANTICOAGULATION CONSULT NOTE  Pharmacy Consult for heparin  Indication: LLE ischemia now s/p thrombectomy  Allergies  Allergen Reactions   Ace Inhibitors Cough   Codeine Itching and Rash    Patient Measurements: Height: 5' 7.2 (170.7 cm) Weight: 64 kg (141 lb) (from 04/10/2024) IBW/kg (Calculated) : 66.56  Vital Signs: Temp: 96.8 F (36 C) (09/25 1400) Temp Source: Bladder (09/25 1212) BP: 95/71 (09/25 1400) Pulse Rate: 83 (09/25 1400)  Labs: Recent Labs    04/19/24 1219 04/19/24 2327 04/20/24 0017 04/20/24 0050 04/20/24 0147 04/20/24 0456 04/20/24 0804 04/20/24 0806 04/20/24 1400  HGB 10.8*   < > 8.8*   < > 7.3* 9.2*  --  8.7* 9.0*  HCT 32.0*   < > 26.0*   < > 21.8* 27.0*  --  25.3* 25.5*  PLT 49*  --  33*  --  28*  --  39* 38* 26*  APTT  --   --   --   --  65*  --  167*  --   --   LABPROT  --   --   --   --  20.4*  --  21.7*  --   --   INR  --   --   --   --  1.7*  --  1.8*  --   --   CREATININE 3.44*  --  3.64*  --  3.61*  --   --   --   --   TROPONINIHS  --   --  >24,000*  --   --   --   --   --   --    < > = values in this interval not displayed.    Estimated Creatinine Clearance: 18.7 mL/min (A) (by C-G formula based on SCr of 3.61 mg/dL (H)).   Medical History: Past Medical History:  Diagnosis Date   Anemia    Aortic stenosis    Arthritis    achiness in shoulder , elbows   Connective tissue disease    followed by Dr. Leni, [facility], pt. off Prednisone  since mid 2017   Fatigue    Headache    Lupus (systemic lupus erythematosus) (HCC)    Paravalvular leak (prosthetic valve), initial encounter 09/17/2017   Perivalvular leak of prosthetic heart valve    PONV (postoperative nausea and vomiting)    Psoriasis    below the knee on both legs    Wears glasses     Medications:  Medications Prior to Admission  Medication Sig Dispense Refill Last Dose/Taking   acetaminophen  (TYLENOL ) 650 MG CR tablet Take 1,300 mg by mouth every 8 (eight) hours  as needed for pain.   Past Week   atorvastatin  (LIPITOR) 20 MG tablet Take 20 mg by mouth daily.    Past Week   Cholecalciferol  (VITAMIN D ) 50 MCG (2000 UT) tablet Take 2,000 Units by mouth daily.   Past Week   diphenhydrAMINE  (BENADRYL ) 25 MG tablet Take 12.5 mg by mouth at bedtime as needed for sleep.   Past Week   famotidine -calcium  carbonate-magnesium  hydroxide (PEPCID  COMPLETE) 10-800-165 MG chewable tablet Chew 1 tablet by mouth daily as needed (acid reflux).   Unknown   fexofenadine (ALLEGRA) 180 MG tablet Take 180 mg by mouth daily.   Past Week   guaiFENesin (MUCINEX) 600 MG 12 hr tablet Take 600 mg by mouth 2 (two) times daily as needed for cough or to loosen phlegm.   Past Week   hydroxychloroquine  (PLAQUENIL ) 200 MG  tablet Take 400 mg by mouth daily.   Past Week   Lidocaine  HCl (PAIN RELIEF ROLL-ON EX) Apply 1 Application topically as needed (pain).   Past Week   MELATONIN PO Take 1 tablet by mouth at bedtime as needed (sleep).   Past Week   metoprolol  tartrate (LOPRESSOR ) 50 MG tablet TAKE 1 TABLET TWICE A DAY (PLEASE SCHEDULE OFFICE VISIT FOR REFILLS) (Patient taking differently: Take 50 mg by mouth 2 (two) times daily.) 60 tablet 0 Past Week   Omega-3 Fatty Acids (FISH OIL) 1000 MG CAPS Take 1,000 mg by mouth at bedtime.   Past Week   sildenafil (VIAGRA) 100 MG tablet Take 100 mg by mouth daily as needed for erectile dysfunction.   Unknown   TALTZ 80 MG/ML pen Inject 80 mg into the skin every 28 (twenty-eight) days.   Past Month   tamsulosin  (FLOMAX ) 0.4 MG CAPS capsule Take 0.4 mg by mouth daily after breakfast.    Past Week   COSENTYX SENSOREADY PEN 150 MG/ML SOAJ Inject 150 mg into the skin every 30 (thirty) days. (Patient not taking: Reported on 04/19/2024)   Not Taking   verapamil  (VERELAN ) 180 MG 24 hr capsule Take 180 mg by mouth daily.      Scheduled:   sodium chloride    Intravenous Once   atorvastatin   20 mg Per Tube Daily   Chlorhexidine  Gluconate Cloth  6 each Topical  Daily   insulin  aspart  0-9 Units Subcutaneous Q4H   pantoprazole  (PROTONIX ) IV  40 mg Intravenous Q12H   sodium chloride  flush  3 mL Intravenous Q12H   tamsulosin   0.4 mg Oral QPC breakfast   Infusions:   sodium chloride  Stopped (04/20/24 1006)   sodium chloride       ceFAZolin  (ANCEF ) IV     fentaNYL  infusion INTRAVENOUS 50 mcg/hr (04/20/24 1036)   norepinephrine  (LEVOPHED ) Adult infusion 6 mcg/min (04/20/24 1036)   propofol  (DIPRIVAN ) infusion 40 mcg/kg/min (04/20/24 1038)   vasopressin  0.03 Units/min (04/20/24 1036)    Assessment: 62yo male admitted w/ acute hypoxemic respiratory failure, went to OR for left leg ischemia, now s/p thrombectomy, to start heparin ; rec'd heparin  6000 units perioperatively; of note there is some concern for GIB, Hgb is low/dropping, and aPTT and INR are elevated at baseline without evidence of PTA anticoagulation, so will will start heparin  cautiously.  Addendum  Hgb came back at 9 this afternoon. Plts remain low. Ok to start heparin  with low goal per Dr Kassie.  INR 1.8, PTT 167 this AM  Goal of Therapy:  Heparin  level 0.3-0.5 units/ml Monitor platelets by anticoagulation protocol: Yes   Plan:  Heparin  800 units/hr Check 8 hr heparin  level Daily heparin  level and CBC  Sergio Batch, PharmD, BCIDP, AAHIVP, CPP Infectious Disease Pharmacist 04/20/2024 3:14 PM

## 2024-04-20 NOTE — Progress Notes (Signed)
Pt transported to and from CT scan on the ventilator without complications. 

## 2024-04-20 NOTE — Progress Notes (Signed)
 Pt transported from ED29 to 3M14 via ventilator without any complications.

## 2024-04-20 NOTE — Op Note (Signed)
 DATE OF SERVICE: 04/20/2024  PATIENT:  Luis Ferguson  63 y.o. male  PRE-OPERATIVE DIAGNOSIS:  acute limb ischemia of left lower extremity  POST-OPERATIVE DIAGNOSIS:  Same  PROCEDURE:   1) left popliteal, anterior tibial and tibioperoneal thrombectomy 2) left calf four compartment calf fasciotomy  SURGEON:  Surgeons and Role:    * Magda Debby SAILOR, MD - Primary  ASSISTANT: Adina Sender, PA-C  An experienced assistant was required given the complexity of this procedure and the standard of surgical care. My assistant helped with exposure through counter tension, suctioning, ligation and retraction to better visualize the surgical field.  My assistant expedited sewing during the case by following my sutures. Wherever I use the term we in the report, my assistant actively helped me with that portion of the procedure.  ANESTHESIA:   general  EBL:  BLOOD ADMINISTERED: PRBC, FFP, PLTs per anesthesia record to treat coagulopathy and anemia present preoperatively.  DRAINS: none   LOCAL MEDICATIONS USED:  NONE  SPECIMEN:  clot for pathology - ? Septic embolus  COUNTS: confirmed correct .  TOURNIQUET:  none   PATIENT DISPOSITION:  ICU - intubated and critically ill.   Delay start of Pharmacological VTE agent (>24hrs) due to surgical blood loss or risk of bleeding: no  INDICATION FOR PROCEDURE: Luis Ferguson is a 63 y.o. male with multiple critical medical issues admitted to the ICU. During admission he was noted to have a cold left foot with absent doppler exam. An accurate neurologic exam could not be performed. After careful discussion of risks, benefits, and alternatives the patient was offered thrombectomy. We specifically discussed risk of limb loss. [name] wife understood and wished to proceed.  OPERATIVE FINDINGS: successful thrombectomy of left lower extremity with restoration of weakly palpable DP pulse. Negative fasciotomy x 4 compartments.  DESCRIPTION  OF PROCEDURE: After identification of the patient in the pre-operative holding area, the patient was transferred to the operating room. The patient was positioned supine on the operating room table. Anesthesia was induced. The left leg was prepped and draped in standard fashion. A surgical pause was performed confirming correct patient, procedure, and operative location.  A left medial calf incision was made with 10 blade. This was carried down through the fascia of the superficial posterior compartment with a bovie. The gastrocnemius was swept posteriorly. The popliteal vascular bundle was identified. Exposure was carried caudally. The anterior tibial vein and crossing veins were divided between silk ligature. Once the AT, tibioperoneal trunk and popliteal artery were isolated, they were encircled with silastic loops.   The patient was heparinized. ACT was used to confirm adequate anticoagulation. The arteries were clamped. A transverse arteriotomy was made with an 11 blade and extended with potts scissor. Thrombectomy was performed proximally with a #3 fogarty with restoration of torrential inflow. A small plug of clot returned. Thrombectomy was performed of the AT and TP trunk. Minimal (if none) clot returned. The arteriotomy was repaired with 6-O prolene.  The leg was examined. Palpable pulse was restored at the DP. The soleal attachments were partially released and so both posterior compartments were released.   An additional incision was made to release the anterior and lateral compartment on the lateral calf. The incision was carried down to the fascia of these compartments which was then released partially. No bulging and healthy muscle was noted.  The wounds were closed using 3-O vicryl and stapler. Bandages were applied.  Upon completion of the case instrument and sharps  counts were confirmed correct. The patient was transferred to the ICU in critical but improved condition. I was present for  all portions of the procedure.  Debby SAILOR. Magda, MD [facility] Phone Number: [phone] 04/20/2024 6:29 PM

## 2024-04-21 ENCOUNTER — Inpatient Hospital Stay (HOSPITAL_COMMUNITY)

## 2024-04-21 ENCOUNTER — Encounter (HOSPITAL_COMMUNITY): Payer: Self-pay | Admitting: Vascular Surgery

## 2024-04-21 DIAGNOSIS — T826XXA Infection and inflammatory reaction due to cardiac valve prosthesis, initial encounter: Secondary | ICD-10-CM

## 2024-04-21 DIAGNOSIS — G936 Cerebral edema: Secondary | ICD-10-CM | POA: Diagnosis not present

## 2024-04-21 DIAGNOSIS — A4101 Sepsis due to Methicillin susceptible Staphylococcus aureus: Secondary | ICD-10-CM

## 2024-04-21 DIAGNOSIS — N179 Acute kidney failure, unspecified: Secondary | ICD-10-CM | POA: Diagnosis not present

## 2024-04-21 DIAGNOSIS — I69391 Dysphagia following cerebral infarction: Secondary | ICD-10-CM

## 2024-04-21 DIAGNOSIS — D62 Acute posthemorrhagic anemia: Secondary | ICD-10-CM

## 2024-04-21 DIAGNOSIS — I63411 Cerebral infarction due to embolism of right middle cerebral artery: Secondary | ICD-10-CM

## 2024-04-21 DIAGNOSIS — D696 Thrombocytopenia, unspecified: Secondary | ICD-10-CM

## 2024-04-21 DIAGNOSIS — B9561 Methicillin susceptible Staphylococcus aureus infection as the cause of diseases classified elsewhere: Secondary | ICD-10-CM | POA: Diagnosis not present

## 2024-04-21 DIAGNOSIS — R570 Cardiogenic shock: Secondary | ICD-10-CM

## 2024-04-21 DIAGNOSIS — I639 Cerebral infarction, unspecified: Secondary | ICD-10-CM | POA: Diagnosis not present

## 2024-04-21 DIAGNOSIS — I330 Acute and subacute infective endocarditis: Secondary | ICD-10-CM

## 2024-04-21 DIAGNOSIS — Z48812 Encounter for surgical aftercare following surgery on the circulatory system: Secondary | ICD-10-CM

## 2024-04-21 DIAGNOSIS — G935 Compression of brain: Secondary | ICD-10-CM

## 2024-04-21 LAB — CBC
HCT: 28.8 % — ABNORMAL LOW (ref 39.0–52.0)
HCT: 28.1 % — ABNORMAL LOW (ref 39.0–52.0)
HCT: 28.5 % — ABNORMAL LOW (ref 39.0–52.0)
Hemoglobin: 10.2 g/dL — ABNORMAL LOW (ref 13.0–17.0)
Hemoglobin: 10 g/dL — ABNORMAL LOW (ref 13.0–17.0)
Hemoglobin: 9.9 g/dL — ABNORMAL LOW (ref 13.0–17.0)
MCH: 29.2 pg (ref 26.0–34.0)
MCH: 29.2 pg (ref 26.0–34.0)
MCH: 29.2 pg (ref 26.0–34.0)
MCHC: 35.4 g/dL (ref 30.0–36.0)
MCHC: 35.6 g/dL (ref 30.0–36.0)
MCHC: 34.7 g/dL (ref 30.0–36.0)
MCV: 82.5 fL (ref 80.0–100.0)
MCV: 81.9 fL (ref 80.0–100.0)
MCV: 84.1 fL (ref 80.0–100.0)
Platelets: 16 K/uL — CL (ref 150–400)
Platelets: 15 K/uL — CL (ref 150–400)
Platelets: 13 K/uL — CL (ref 150–400)
RBC: 3.49 MIL/uL — ABNORMAL LOW (ref 4.22–5.81)
RBC: 3.43 MIL/uL — ABNORMAL LOW (ref 4.22–5.81)
RBC: 3.39 MIL/uL — ABNORMAL LOW (ref 4.22–5.81)
RDW: 14.7 % (ref 11.5–15.5)
RDW: 15.1 % (ref 11.5–15.5)
RDW: 15.3 % (ref 11.5–15.5)
WBC: 10.3 K/uL (ref 4.0–10.5)
WBC: 11.4 K/uL — ABNORMAL HIGH (ref 4.0–10.5)
WBC: 10.6 K/uL — ABNORMAL HIGH (ref 4.0–10.5)
nRBC: 0 % (ref 0.0–0.2)
nRBC: 0.3 % — ABNORMAL HIGH (ref 0.0–0.2)
nRBC: 0.4 % — ABNORMAL HIGH (ref 0.0–0.2)

## 2024-04-21 LAB — COMPREHENSIVE METABOLIC PANEL WITH GFR
ALT: 87 U/L — ABNORMAL HIGH (ref 0–44)
AST: 165 U/L — ABNORMAL HIGH (ref 15–41)
Albumin: 1.9 g/dL — ABNORMAL LOW (ref 3.5–5.0)
Alkaline Phosphatase: 100 U/L (ref 38–126)
Anion gap: 12 (ref 5–15)
BUN: 120 mg/dL — ABNORMAL HIGH (ref 8–23)
CO2: 17 mmol/L — ABNORMAL LOW (ref 22–32)
Calcium: 6.7 mg/dL — ABNORMAL LOW (ref 8.9–10.3)
Chloride: 119 mmol/L — ABNORMAL HIGH (ref 98–111)
Creatinine, Ser: 3.27 mg/dL — ABNORMAL HIGH (ref 0.61–1.24)
GFR, Estimated: 20 mL/min — ABNORMAL LOW (ref >60)
Glucose, Bld: 143 mg/dL — ABNORMAL HIGH (ref 70–99)
Potassium: 3.6 mmol/L (ref 3.5–5.1)
Sodium: 148 mmol/L — ABNORMAL HIGH (ref 135–145)
Total Bilirubin: 1.7 mg/dL — ABNORMAL HIGH (ref 0.0–1.2)
Total Protein: 4.4 g/dL — ABNORMAL LOW (ref 6.5–8.1)

## 2024-04-21 LAB — GLUCOSE, CAPILLARY
Glucose-Capillary: 120 mg/dL — ABNORMAL HIGH (ref 70–99)
Glucose-Capillary: 102 mg/dL — ABNORMAL HIGH (ref 70–99)
Glucose-Capillary: 121 mg/dL — ABNORMAL HIGH (ref 70–99)
Glucose-Capillary: 102 mg/dL — ABNORMAL HIGH (ref 70–99)

## 2024-04-21 LAB — BPAM PLATELET PHERESIS
Blood Product Expiration Date: 202509262359
Blood Product Expiration Date: 202509282359
Blood Product Expiration Date: 202509282359
ISSUE DATE / TIME: 202509250334
ISSUE DATE / TIME: 202509250504
ISSUE DATE / TIME: 202509250504
Unit Type and Rh: 6200
Unit Type and Rh: 6200
Unit Type and Rh: 7300

## 2024-04-21 LAB — LIPID PANEL
Cholesterol: 41 mg/dL (ref 0–200)
HDL: <10 mg/dL — ABNORMAL LOW (ref >40)
Triglycerides: 191 mg/dL — ABNORMAL HIGH (ref <150)
VLDL: 38 mg/dL (ref 0–40)

## 2024-04-21 LAB — PREPARE PLATELET PHERESIS
Unit division: 0
Unit division: 0
Unit division: 0

## 2024-04-21 LAB — MAGNESIUM: Magnesium: 2.6 mg/dL — ABNORMAL HIGH (ref 1.7–2.4)

## 2024-04-21 LAB — LACTIC ACID, PLASMA: Lactic Acid, Venous: 1.8 mmol/L (ref 0.5–1.9)

## 2024-04-21 LAB — SURGICAL PATHOLOGY

## 2024-04-21 LAB — SODIUM
Sodium: 144 mmol/L (ref 135–145)
Sodium: 152 mmol/L — ABNORMAL HIGH (ref 135–145)

## 2024-04-21 LAB — PHOSPHORUS: Phosphorus: 6.9 mg/dL — ABNORMAL HIGH (ref 2.5–4.6)

## 2024-04-21 LAB — TRIGLYCERIDES: Triglycerides: 198 mg/dL — ABNORMAL HIGH (ref <150)

## 2024-04-21 MED ORDER — GLYCOPYRROLATE 0.2 MG/ML IJ SOLN
0.2000 mg | INTRAMUSCULAR | Status: DC | PRN
  Administered 2024-04-21: 0.2 mg via INTRAVENOUS
  Filled 2024-04-21: qty 1

## 2024-04-21 MED ORDER — ACETAMINOPHEN 650 MG RE SUPP: 650.0000 mg | Freq: Four times a day (QID) | RECTAL | Status: DC | PRN

## 2024-04-21 MED ORDER — GLYCOPYRROLATE 0.2 MG/ML IJ SOLN: 0.2000 mg | INTRAMUSCULAR | Status: DC | PRN

## 2024-04-21 MED ORDER — POLYVINYL ALCOHOL 1.4 % OP SOLN: 1.0000 [drp] | Freq: Four times a day (QID) | OPHTHALMIC | Status: DC | PRN

## 2024-04-21 MED ORDER — CEFAZOLIN SODIUM-DEXTROSE 2-4 GM/100ML-% IV SOLN
2.0000 g | Freq: Three times a day (TID) | INTRAVENOUS | Status: DC
  Administered 2024-04-21: 2 g via INTRAVENOUS

## 2024-04-21 MED ORDER — MIDAZOLAM HCL 2 MG/2ML IJ SOLN
2.0000 mg | INTRAMUSCULAR | Status: DC | PRN
  Administered 2024-04-21: 2 mg via INTRAVENOUS

## 2024-04-21 MED ORDER — GLYCOPYRROLATE 1 MG PO TABS: 1.0000 mg | ORAL_TABLET | ORAL | Status: DC | PRN

## 2024-04-21 MED ORDER — ACETAMINOPHEN 325 MG PO TABS: 650.0000 mg | ORAL_TABLET | Freq: Four times a day (QID) | ORAL | Status: DC | PRN

## 2024-04-21 MED ORDER — FENTANYL 2500MCG IN NS 250ML (10MCG/ML) PREMIX INFUSION
0.0000 ug/h | INTRAVENOUS | Status: DC
  Administered 2024-04-21: 75 ug/h via INTRAVENOUS

## 2024-04-21 MED ORDER — ORAL CARE MOUTH RINSE: 15.0000 mL | OROMUCOSAL | Status: DC | PRN

## 2024-04-21 MED ORDER — FENTANYL BOLUS VIA INFUSION
100.0000 ug | INTRAVENOUS | Status: DC | PRN
  Administered 2024-04-21: 100 ug via INTRAVENOUS

## 2024-04-21 MED ORDER — FENTANYL BOLUS VIA INFUSION
25.0000 ug | INTRAVENOUS | Status: DC | PRN
  Administered 2024-04-21: 50 ug via INTRAVENOUS
  Administered 2024-04-21: 25 ug via INTRAVENOUS

## 2024-04-21 MED ORDER — ORAL CARE MOUTH RINSE
15.0000 mL | OROMUCOSAL | Status: DC
  Administered 2024-04-21 (×2): 15 mL via OROMUCOSAL

## 2024-04-21 MED ORDER — NOREPINEPHRINE 4 MG/250ML-% IV SOLN
0.0000 ug/min | INTRAVENOUS | Status: DC
  Administered 2024-04-21: 2 ug/min via INTRAVENOUS

## 2024-04-22 LAB — POCT I-STAT 7, (LYTES, BLD GAS, ICA,H+H)
Acid-base deficit: 7 mmol/L — ABNORMAL HIGH (ref 0.0–2.0)
Bicarbonate: 17.6 mmol/L — ABNORMAL LOW (ref 20.0–28.0)
Calcium, Ion: 1.11 mmol/L — ABNORMAL LOW (ref 1.15–1.40)
HCT: 31 % — ABNORMAL LOW (ref 39.0–52.0)
Hemoglobin: 10.5 g/dL — ABNORMAL LOW (ref 13.0–17.0)
O2 Saturation: 97 %
Patient temperature: 99
Potassium: 3.3 mmol/L — ABNORMAL LOW (ref 3.5–5.1)
Sodium: 139 mmol/L (ref 135–145)
TCO2: 19 mmol/L — ABNORMAL LOW (ref 22–32)
pCO2 arterial: 33.9 mmHg (ref 32–48)
pH, Arterial: 7.325 — ABNORMAL LOW (ref 7.35–7.45)
pO2, Arterial: 102 mmHg (ref 83–108)

## 2024-04-22 LAB — CULTURE, BLOOD (ROUTINE X 2): Special Requests: ADEQUATE

## 2024-04-22 LAB — CULTURE, BLOOD (SINGLE)

## 2024-04-22 LAB — CMV ANTIBODY, IGG (EIA): CMV Ab - IgG: >10 U/mL — ABNORMAL HIGH (ref 0.00–0.59)

## 2024-04-22 LAB — CMV IGM: CMV IgM: <30 [AU]/ml (ref 0.0–29.9)

## 2024-04-23 LAB — BPAM RBC
Blood Product Expiration Date: 202510252359
Blood Product Expiration Date: 202510252359
Blood Product Expiration Date: 202510252359
Blood Product Expiration Date: 202510252359
ISSUE DATE / TIME: 202509250334
ISSUE DATE / TIME: 202509250334
ISSUE DATE / TIME: 202509250334
ISSUE DATE / TIME: 202509250334
Unit Type and Rh: 5100
Unit Type and Rh: 5100
Unit Type and Rh: 5100
Unit Type and Rh: 5100

## 2024-04-23 LAB — TYPE AND SCREEN
ABO/RH(D): O POS
Antibody Screen: NEGATIVE
Unit division: 0
Unit division: 0
Unit division: 0
Unit division: 0

## 2024-04-24 LAB — DRVVT MIX: dRVVT Mix: 46.8 s — ABNORMAL HIGH (ref 0.0–40.4)

## 2024-04-24 LAB — ANTIPHOSPHOLIPID SYNDROME EVAL, BLD
Anticardiolipin IgA: <9 U/mL (ref 0–11)
Anticardiolipin IgG: <9 GPL U/mL (ref 0–14)
Anticardiolipin IgM: <9 [MPL'U]/mL (ref 0–12)
DRVVT: 80.6 s — ABNORMAL HIGH (ref 0.0–47.0)
PTT Lupus Anticoagulant: 45.9 s — ABNORMAL HIGH (ref 0.0–43.5)
Phosphatydalserine, IgA: <1 U (ref 0–3)
Phosphatydalserine, IgG: 9 U (ref 0–20)
Phosphatydalserine, IgM: <10 U (ref 0–50)

## 2024-04-24 LAB — DRVVT CONFIRM: dRVVT Confirm: 1.4 ratio — ABNORMAL HIGH (ref 0.8–1.2)

## 2024-04-24 LAB — PTT-LA MIX: PTT-LA Mix: 45.5 s — ABNORMAL HIGH (ref 0.0–40.5)

## 2024-04-24 LAB — HEXAGONAL PHASE PHOSPHOLIPID: Hexagonal Phase Phospholipid: 2 s (ref 0–11)

## 2024-04-25 LAB — CULTURE, BLOOD (ROUTINE X 2)
Culture: NO GROWTH
Special Requests: ADEQUATE

## 2024-04-26 NOTE — Progress Notes (Signed)
 [facility]  Date of Admission:  04/19/2024     Reason for Follow Up: MSSA bacteremia  Total days of antibiotics 3         ASSESSMENT:  Luis Ferguson is a 63 year old Caucasian male presenting to the hospital with worsening cough, congestion, and fever and found to have a positive coronavirus H KU 1 viral infection complicated by MSSA bacteremia and acute respiratory failure requiring intubation.  Also found to have left leg ischemia with concern for acute critical limb ischemia s/p vascular surgery intervention.   CT and MRI of the brain showing right MCA infarcts.  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  Discussed plan of care to continue current dose of cefazolin .  Wife at bedside discussed likelihood of transition to comfort care as he would not have wanted life support.  Empathy provided.  Continue current dose of cefazolin  pending decision for comfort care.  No additional workup planned at this time.  Continue droplet precautions for coronavirus H KU 1.  Remaining medical and supportive care per PCCM.  PLAN:  Continue current dose of cefazolin  No additional workup at this time pending likely transition to comfort care. Remaining medical and supportive care per PCCM.  ID will monitor for comfort care decision and sign off if pursued.  Principal Problem:   AKI (acute kidney injury) Active Problems:   Aortic stenosis   Dyslipidemia   Lupus (systemic lupus erythematosus) (HCC)   Aortic valve regurgitation   S/P AVR   Symptomatic anemia   GERD (gastroesophageal reflux disease)   Perivalvular leak of prosthetic heart valve   Benign prostatic hyperplasia without urinary obstruction   Chronic kidney disease due to hypertension   Acute hypoxic respiratory failure (HCC)   MSSA bacteremia   Coronavirus infection    sodium chloride     Intravenous Once   atorvastatin   20 mg Per Tube Daily   Chlorhexidine  Gluconate Cloth  6 each Topical Daily   insulin  aspart  0-9 Units Subcutaneous Q4H   mouth rinse  15 mL Mouth Rinse Q2H   pantoprazole  (PROTONIX ) IV  40 mg Intravenous Q12H    SUBJECTIVE:  Afebrile overnight with no acute events.  Wife at bedside.  Allergies  Allergen Reactions   Ace Inhibitors Cough   Codeine Itching and Rash     Review of Systems: Review of Systems  Unable to perform ROS: Intubated      OBJECTIVE: Vitals:   04/26/24 0900 2024/04/26 1000 2024/04/26 1100 2024/04/26 1200  BP: 107/77 96/68 98/79  (!) 89/71  Pulse:    (!) 144  Resp: 20 18 (!) 30 18  Temp: 99.3 F (37.4 C) 99.7 F (37.6 C) 99.9 F (37.7 C) 100.2 F (37.9 C)  TempSrc:      SpO2:    99%  Weight:      Height:       Body mass index is 23.1 kg/m.  Physical Exam Constitutional:      General: He is not in acute distress.    Appearance: He is well-developed.  Cardiovascular:     Rate and Rhythm: Normal rate and regular rhythm.     Heart sounds: Normal heart sounds.  Pulmonary:     Effort: Pulmonary effort is normal.     Breath sounds: Normal breath sounds.  Skin:    General: Skin is warm and dry.     Lab Results Lab Results  Component Value Date   WBC 11.4 (H) 04-23-2024   HGB 10.0 (L) 04-23-24   HCT 28.1 (L) 04/23/24   MCV 81.9 04-23-24   PLT 15 (LL) Apr 23, 2024    Lab Results  Component Value Date   CREATININE 3.27 (H) April 23, 2024   BUN 120 (H) 23-Apr-2024   NA 148 (H) 2024-04-23   K 3.6 2024-04-23   CL 119 (H) 04/23/24   CO2 17 (L) 2024/04/23    Lab Results  Component Value Date   ALT 87 (H) 04-23-24   AST 165 (H) 2024-04-23   ALKPHOS 100 04/23/24   BILITOT 1.7 (H) Apr 23, 2024     Microbiology: Recent Results (from the past 240 hours)  Resp panel by RT-PCR (RSV, Flu A&B, Covid) Anterior Nasal Swab     Status: None   Collection Time: 04/19/24 12:19 PM   Specimen: Anterior Nasal Swab   Result Value Ref Range Status   SARS Coronavirus 2 by RT PCR NEGATIVE NEGATIVE Final   Influenza A by PCR NEGATIVE NEGATIVE Final   Influenza B by PCR NEGATIVE NEGATIVE Final    Comment: (NOTE) The Xpert Xpress SARS-CoV-2/FLU/RSV plus assay is intended as an aid in the diagnosis of influenza from Nasopharyngeal swab specimens and should not be used as a sole basis for treatment. Nasal washings and aspirates are unacceptable for Xpert Xpress SARS-CoV-2/FLU/RSV testing.  Fact Sheet for Patients: BloggerCourse.com  Fact Sheet for Healthcare Providers: SeriousBroker.it  This test is not yet approved or cleared by the [place]  FDA and has been authorized for detection and/or diagnosis of SARS-CoV-2 by FDA under an Emergency Use Authorization (EUA). This EUA will remain in effect (meaning this test can be used) for the duration of the COVID-19 declaration under Section 564(b)(1) of the Act, 21 U.S.C. section 360bbb-3(b)(1), unless the authorization is terminated or revoked.     Resp Syncytial Virus by PCR NEGATIVE NEGATIVE Final    Comment: (NOTE) Fact Sheet for Patients: BloggerCourse.com  Fact Sheet for Healthcare Providers: SeriousBroker.it  This test is not yet approved or cleared by the [place]  FDA and has been authorized for detection and/or diagnosis of SARS-CoV-2 by FDA under an Emergency Use Authorization (EUA). This EUA will remain in effect (meaning this test can be used) for the duration of the COVID-19 declaration under Section 564(b)(1) of the Act, 21 U.S.C. section 360bbb-3(b)(1), unless the authorization is terminated or revoked.  Performed at [facility], [address], [place], [place] [address]   Respiratory (~20 pathogens) panel by PCR     Status: Abnormal   Collection Time: 04/19/24 12:19 PM   Specimen: Nasopharyngeal Swab; Respiratory   Result Value Ref Range Status   Adenovirus NOT DETECTED NOT DETECTED Final   Coronavirus 229E NOT DETECTED NOT DETECTED Final    Comment: (NOTE) The Coronavirus on the Respiratory Panel, DOES NOT test for the novel  Coronavirus (2019 nCoV)    Coronavirus HKU1 DETECTED (A) NOT DETECTED Final   Coronavirus NL63 NOT DETECTED NOT DETECTED Final   Coronavirus OC43 NOT DETECTED NOT DETECTED Final   Metapneumovirus NOT DETECTED NOT DETECTED Final   Rhinovirus / Enterovirus NOT DETECTED NOT DETECTED Final   Influenza A NOT DETECTED NOT DETECTED Final   Influenza B NOT DETECTED NOT DETECTED Final   Parainfluenza Virus 1 NOT DETECTED NOT DETECTED Final   Parainfluenza Virus 2 NOT DETECTED NOT DETECTED Final   Parainfluenza Virus 3 NOT DETECTED NOT DETECTED Final   Parainfluenza Virus 4 NOT DETECTED NOT  DETECTED Final   Respiratory Syncytial Virus NOT DETECTED NOT DETECTED Final   Bordetella pertussis NOT DETECTED NOT DETECTED Final   Bordetella Parapertussis NOT DETECTED NOT DETECTED Final   Chlamydophila pneumoniae NOT DETECTED NOT DETECTED Final   Mycoplasma pneumoniae NOT DETECTED NOT DETECTED Final    Comment: Performed at [facility], [address], [place], [place] [address]  Culture, blood (single)     Status: Abnormal (Preliminary result)   Collection Time: 04/19/24 11:20 PM   Specimen: BLOOD  Result Value Ref Range Status   Specimen Description BLOOD SITE NOT SPECIFIED  Final   Special Requests   Final    BOTTLES DRAWN AEROBIC AND ANAEROBIC Blood Culture results may not be optimal due to an inadequate volume of blood received in culture bottles   Culture  Setup Time   Final    GRAM POSITIVE COCCI IN CLUSTERS IN BOTH AEROBIC AND ANAEROBIC BOTTLES CRITICAL VALUE NOTED.  VALUE IS CONSISTENT WITH PREVIOUSLY REPORTED AND CALLED VALUE. Performed at [facility], [address] [place], [place] [address]    Culture STAPHYLOCOCCUS AUREUS (A)  Final   Report Status  PENDING  Incomplete  Culture, blood (Routine X 2) w Reflex to ID Panel     Status: Abnormal (Preliminary result)   Collection Time: 04/20/24 12:15 AM   Specimen: BLOOD RIGHT ARM  Result Value Ref Range Status   Specimen Description BLOOD RIGHT ARM  Final   Special Requests   Final    BOTTLES DRAWN AEROBIC AND ANAEROBIC Blood Culture adequate volume   Culture  Setup Time   Final    GRAM POSITIVE COCCI IN CLUSTERS IN BOTH AEROBIC AND ANAEROBIC BOTTLES CRITICAL RESULT CALLED TO, READ BACK BY AND VERIFIED WITH: PHARMD A JAMINEZ [id number] AT 950 AM BY CM    Culture (A)  Final    STAPHYLOCOCCUS AUREUS SUSCEPTIBILITIES TO FOLLOW Performed at [facility], [address] [place], [place] [address]    Report Status PENDING  Incomplete  Blood Culture ID Panel (Reflexed)     Status: Abnormal   Collection Time: 04/20/24 12:15 AM  Result Value Ref Range Status   Enterococcus faecalis NOT DETECTED NOT DETECTED Final   Enterococcus Faecium NOT DETECTED NOT DETECTED Final   Listeria monocytogenes NOT DETECTED NOT DETECTED Final   Staphylococcus species DETECTED (A) NOT DETECTED Final    Comment: CRITICAL RESULT CALLED TO, READ BACK BY AND VERIFIED WITH: PHARMD A JAMINEZ [id number] AT 950 AM BY CM    Staphylococcus aureus (BCID) DETECTED (A) NOT DETECTED Final    Comment: CRITICAL RESULT CALLED TO, READ BACK BY AND VERIFIED WITH: PHARMD A JAMINEZ [id number] AT 950 AM BY CM    Staphylococcus epidermidis NOT DETECTED NOT DETECTED Final   Staphylococcus lugdunensis NOT DETECTED NOT DETECTED Final   Streptococcus species NOT DETECTED NOT DETECTED Final   Streptococcus agalactiae NOT DETECTED NOT DETECTED Final   Streptococcus pneumoniae NOT DETECTED NOT DETECTED Final   Streptococcus pyogenes NOT DETECTED NOT DETECTED Final   A.calcoaceticus-baumannii NOT DETECTED NOT DETECTED Final   Bacteroides fragilis NOT DETECTED NOT DETECTED Final   Enterobacterales NOT DETECTED NOT DETECTED Final    Enterobacter cloacae complex NOT DETECTED NOT DETECTED Final   Escherichia coli NOT DETECTED NOT DETECTED Final   Klebsiella aerogenes NOT DETECTED NOT DETECTED Final   Klebsiella oxytoca NOT DETECTED NOT DETECTED Final   Klebsiella pneumoniae NOT DETECTED NOT DETECTED Final   Proteus species NOT DETECTED NOT DETECTED  Final   Salmonella species NOT DETECTED NOT DETECTED Final   Serratia marcescens NOT DETECTED NOT DETECTED Final   Haemophilus influenzae NOT DETECTED NOT DETECTED Final   Neisseria meningitidis NOT DETECTED NOT DETECTED Final   Pseudomonas aeruginosa NOT DETECTED NOT DETECTED Final   Stenotrophomonas maltophilia NOT DETECTED NOT DETECTED Final   Candida albicans NOT DETECTED NOT DETECTED Final   Candida auris NOT DETECTED NOT DETECTED Final   Candida glabrata NOT DETECTED NOT DETECTED Final   Candida krusei NOT DETECTED NOT DETECTED Final   Candida parapsilosis NOT DETECTED NOT DETECTED Final   Candida tropicalis NOT DETECTED NOT DETECTED Final   Cryptococcus neoformans/gattii NOT DETECTED NOT DETECTED Final   Meth resistant mecA/C and MREJ NOT DETECTED NOT DETECTED Final    Comment: Performed at [facility], [address], [place], [place] [address]  MRSA Next Gen by PCR, Nasal     Status: None   Collection Time: 04/20/24  1:06 AM   Specimen: Nasal Mucosa; Nasal Swab  Result Value Ref Range Status   MRSA by PCR Next Gen NOT DETECTED NOT DETECTED Final    Comment: (NOTE) The GeneXpert MRSA Assay (FDA approved for NASAL specimens only), is one component of a comprehensive MRSA colonization surveillance program. It is not intended to diagnose MRSA infection nor to guide or monitor treatment for MRSA infections. Test performance is not FDA approved in patients less than 55 years old. Performed at [facility], [address] [place], [place] [address]   Culture, blood (Routine X 2) w Reflex to ID Panel     Status: None (Preliminary result)   Collection  Time: 04/20/24  8:04 AM   Specimen: BLOOD LEFT ARM  Result Value Ref Range Status   Specimen Description BLOOD LEFT ARM  Final   Special Requests   Final    BOTTLES DRAWN AEROBIC AND ANAEROBIC Blood Culture adequate volume   Culture   Final    NO GROWTH < 24 HOURS Performed at [facility], [address], [place], [place] [address]    Report Status PENDING  Incomplete     Cathlyn July, NP [facility]  04-28-24  12:59 PM

## 2024-04-26 NOTE — Progress Notes (Signed)
 RT transported pt to MRI and back on vent with RN and Transport with no complications.

## 2024-04-26 NOTE — Progress Notes (Signed)
 Ok to hold Flomax  for now since it can't go down the tube per [employer].  Sergio Batch, PharmD, BCIDP, AAHIVP, CPP Infectious Disease Pharmacist 05-17-2024 8:42 AM

## 2024-04-26 NOTE — Progress Notes (Signed)
 NAME:  Luis Ferguson, MRN:  [id number], DOB:  14-Jan-1961, LOS: 2 ADMISSION DATE:  04/19/2024, CONSULTATION DATE:  04/20/2024 REFERRING MD:  Redia Cleaver, MD, CHIEF COMPLAINT:  N/V  History of Present Illness:  63 y/o male with PMH for Lupus, CKD, Anemia, Aortic Valve regurg and stenosis s/p replacement, Psoriatic arthritis, BPH, GERD, CAD, Dementia who presented with N/V x 4 days, worsening with fevers and weakness along with abdominal pain.  He was admitted to Hospitalist service.  At that time his O2 was in the 90's on RA and his BP low was SBP 90's.  CT abdomen and chest were relatively benign.  However, he became very tachypnic and was intubated.  His SBP as low as 50's in ED.  He was given Neostick and remained low.  Levophed  to be started.  Dark fluid from OGT possible blood.  Pertinent  Medical History  Lupus, CKD, Anemia, Aortic Valve regurg and stenosis s/p replacement, Psoriatic arthritis, BPH, GERD, CAD, Dementia   Significant Hospital Events: Including procedures, antibiotic start and stop dates in addition to other pertinent events   9/24: admitted to hospitalist service> worsening SOB and intubated in ED, ischemic L foot> OR with VSS, shock, AKI 9/25 CTH c/w with R MCA territory CVA, Neuro consult, MRI, HTS started  Interim History / Subjective:  More tachycardic and hypotensive this am, restarted on low dose NE 2-4 mcg, fentanyl  gtt restarted as more restless.  Remains ST 140 despite fentanyl    Objective    Blood pressure 105/82, pulse 100, temperature 98.8 F (37.1 C), resp. rate 20, height 5' 7.2 (1.707 m), weight 67.3 kg, SpO2 100%.    Vent Mode: PRVC FiO2 (%):  [40 %] 40 % Set Rate:  [18 bmp] 18 bmp Vt Set:  [530 mL] 530 mL PEEP:  [5 cmH20] 5 cmH20 Plateau Pressure:  [16 cmH20-20 cmH20] 17 cmH20   Intake/Output Summary (Last 24 hours) at May 03, 2024 0842 Last data filed at 03-May-2024 0600 Gross per 24 hour  Intake 2543.87 ml  Output 550 ml  Net 1993.87 ml    Filed Weights   04/19/24 2348 2024-05-03 0500  Weight: 64 kg 67.3 kg    Examination: Fent 50 General:  critically ill thin older adult male lying in bed HEENT: MM pink/moist, ETT/ OGT, pupils 3/r, anicertic Neuro: does not open eyes, left eye slightly open, not consistently will follow commands on R, spont moves right hand and foot, noxious w/d to left foot, LUE flaccid CV: rr, ST 140, faint +1 pulses, no obvious murmur, R internal jugular cvl, R radial aline with mottling of right fingers new and cool, pt has spont movement of hand PULM:  MV supported, will trigger vent but no tachypnea, clear, diminished cough, scant secretions GI: soft, hypoBS, ND, foley Extremities: warm/dry, no tibial edema, LLE with dressing CDI- foot warm, good color, faint palpable dp  Tmax 99.3 Labs> Na 144> 148, K 3.6, bicab 17, BUN/ sCr 110/ 3.61> 120/3.27, ALT/ AST down trending, WBC 11.4, H/H 10/ 28, plts 16> 15, lactic 1.8  Echo 9/25> EF 35-40%, GHK, G2DD, mod reduced RV, RVSP 24.9, thickened/ calcified MV  MRI brain 9/25>  1. Acute infarction throughout the right middle cerebral artery territory. 2. Additional multifocal acute embolic infarcts within the left cerebral hemisphere cortex, left cerebellar hemisphere, and right occipital lobe. 3. Moderate right hemispheric edema with sulcal and partial lateral ventricular effacement, with approximately 3 mm leftward midline shift.  MRA brain 9/25>  1. Abrupt occlusion of  the proximal M1 segment of the right middle cerebral artery.   Resolved problem list   Assessment and Plan   Acute Hypoxic respiratory failure Coronavirus HKU1 - cont full respiratory support for now.  Minimal FiO2 requirements  - VAP/ PPI  - PAD protocol> fentanyl  gtt, RASS goal 0/-1  Shock 2/2 Septic shock 2/2 MSSA bacteremia, UTI, and probable cardiogenic/ septic cardiomyopathy, EF 35%, moderately reduced RV AV Infective Endocarditis with prior bioprosthetic AVR, no  significant regurgitation  - cont NE> given GOC, no escalation in pressor support - lactic reassuring this am, 1.8 - d/c aline given right hand ischemia - cont cefazolin  per ID recs - no escalation of care per GOC as below  Critical limb LLE ischemia s/p thrombectomy and fasciotomy 9/25 - appreciate VVS s/p dressing change this am May 19, 2024. Foot warm, faint palpable pulses.   - no heparin  given large CVA and hemorrhagic conversion risk   Acute R MCA territory infarct and multifocal infarcts in left cerebral hemisphere cortex, left cerebellar hemisphere, and right occipital lobe suspected septic emboli in setting of bacteremia and AV IE - appreciate Neurology input - remains on HTS, q6 Na, goal 150-155 - serial neuro exams/ neuro protective care  AKI with oliguria on unspecified CKD  Metabolic acidosis  - remains oliguric, stable renal indices, net +8.1L - foley - trend renal indices   Anemia Thrombocytopenia - neg for schistocytes 9/25 Coagulopathy Elevated LFTs, likely ischemic in setting of shock - slightly improved LFTs.  Plts down trending likely in setting of severe septic shock/ bacteremia without any obvious evidence of bleeding  Possible GI bleed  - H/H stable, cont PPI BID  GOC - remains DNR> no CPR.  Overall very poor prognosis.  Wife at bedside states pt would not want to be on life support, especially no prolonged life support or to be in nursing home.  Wife wants him to be comfortable and would be ok if he went on his own.  Strong faith.  Awaiting arrival of family from out of town today and then to consider transition to full comfort care and one way palliative extubation.  No escalation of care if further decompensation prior to family arrival.   Labs   CBC: Recent Labs  Lab 04/19/24 1219 04/19/24 2327 04/20/24 0017 04/20/24 0050 04/20/24 0147 04/20/24 0456 04/20/24 0804 04/20/24 0806 04/20/24 1400 04/20/24 2300 May 19, 2024 0252  WBC 9.2  --  7.1  --  6.4   --   --  9.7 8.6 11.2* 10.3  NEUTROABS 8.8*  --  6.3  --   --   --   --  8.6*  --   --   --   HGB 10.8*   < > 8.8*   < > 7.3* 9.2*  --  8.7* 9.0* 10.5* 10.2*  HCT 32.0*   < > 26.0*   < > 21.8* 27.0*  --  25.3* 25.5* 29.6* 28.8*  MCV 84.2  --  84.7  --  85.2  --   --  83.5 83.1 82.0 82.5  PLT 49*  --  33*  --  28*  --  39* 38* 26* 22* 16*   < > = values in this interval not displayed.    Basic Metabolic Panel: Recent Labs  Lab 04/19/24 1219 04/19/24 2327 04/20/24 0017 04/20/24 0050 04/20/24 0147 04/20/24 0456 04/20/24 1935 05-19-24 0252  NA 132* 137 136 138 136 138 141 144  K 3.8 3.6 3.7 3.6 3.5 3.7  --   --  CL 97*  --  106  --  107  --   --   --   CO2 19*  --  15*  --  15*  --   --   --   GLUCOSE 106*  --  104*  --  102*  --   --   --   BUN 95*  --  112*  --  110*  --   --   --   CREATININE 3.44*  --  3.64*  --  3.61*  --   --   --   CALCIUM  8.0*  --  7.0*  --  6.8*  --   --   --   MG  --   --   --   --  2.1  --   --   --   PHOS  --   --   --   --  4.8*  --   --   --    GFR: Estimated Creatinine Clearance: 19.5 mL/min (A) (by C-G formula based on SCr of 3.61 mg/dL (H)). Recent Labs  Lab 04/20/24 0030 04/20/24 0147 04/20/24 0228 04/20/24 0806 04/20/24 1400 04/20/24 1518 04/20/24 2300 05-07-2024 0252 05/07/2024 0535  WBC  --    < >  --  9.7 8.6  --  11.2* 10.3  --   LATICACIDVEN 3.4*  --  4.5*  --   --  2.7*  --   --  1.8   < > = values in this interval not displayed.    Liver Function Tests: Recent Labs  Lab 04/19/24 1219 04/20/24 0017 04/20/24 0147  AST 160* 246* 223*  ALT 98* 115* 103*  ALKPHOS 140* 112 95  BILITOT 1.2 1.1 1.2  PROT 6.3* 4.9* 4.3*  ALBUMIN  2.5* 1.9* 1.6*   Recent Labs  Lab 04/19/24 1219  LIPASE 19   No results for input(s): AMMONIA in the last 168 hours.  ABG    Component Value Date/Time   PHART 7.189 (LL) 04/20/2024 0456   PCO2ART 41.4 04/20/2024 0456   PO2ART 283 (H) 04/20/2024 0456   HCO3 15.8 (L) 04/20/2024 0456   TCO2  17 (L) 04/20/2024 0456   ACIDBASEDEF 12.0 (H) 04/20/2024 0456   O2SAT 100 04/20/2024 0456     Coagulation Profile: Recent Labs  Lab 04/20/24 0147 04/20/24 0804  INR 1.7* 1.8*    Cardiac Enzymes: No results for input(s): CKTOTAL, CKMB, CKMBINDEX, TROPONINI in the last 168 hours.  HbA1C: Hgb A1c MFr Bld  Date/Time Value Ref Range Status  04/20/2024 01:47 AM 6.1 (H) 4.8 - 5.6 % Final    Comment:    (NOTE) Diagnosis of Diabetes The following HbA1c ranges recommended by the American Diabetes Association (ADA) may be used as an aid in the diagnosis of diabetes mellitus.  Hemoglobin             Suggested A1C NGSP%              Diagnosis  <5.7                   Non Diabetic  5.7-6.4                Pre-Diabetic  >6.4                   Diabetic  <7.0                   Glycemic control for  adults with diabetes.    09/16/2017 09:01 AM 4.1 (L) 4.8 - 5.6 % Final    Comment:    (NOTE) Pre diabetes:          5.7%-6.4% Diabetes:              >6.4% Glycemic control for   <7.0% adults with diabetes     CBG: Recent Labs  Lab 04/20/24 1516 04/20/24 1935 04/20/24 2344 04/30/24 0358 2024/04/30 0811  GLUCAP 137* 114* 120* 102* 121*    Review of Systems:   Intubated, unable to do  Past Medical History:  He,  has a past medical history of Anemia, Aortic stenosis, Arthritis, Connective tissue disease, Fatigue, Headache, Lupus (systemic lupus erythematosus) (HCC), Paravalvular leak (prosthetic valve), initial encounter (09/17/2017), Perivalvular leak of prosthetic heart valve, PONV (postoperative nausea and vomiting), Psoriasis, and Wears glasses.   Surgical History:   Past Surgical History:  Procedure Laterality Date   AORTIC VALVE REPLACEMENT  06/2017   AORTIC VALVE REPLACEMENT N/A 09/17/2017   Procedure: REDO AORTIC VALVE REPLACEMENT (AVR);  Surgeon: Kerrin Elspeth BROCKS, MD;  Location: [facility] OR;  Service: Open Heart Surgery;  Laterality: N/A;    CARDIAC CATHETERIZATION     HERNIA REPAIR  2016   left and right   RIGHT/LEFT HEART CATH AND CORONARY ANGIOGRAPHY N/A 06/16/2017   Procedure: RIGHT/LEFT HEART CATH AND CORONARY ANGIOGRAPHY;  Surgeon: Claudene Victory ORN, MD;  Location: [facility] INVASIVE CV LAB;  Service: Cardiovascular;  Laterality: N/A;   TEE WITHOUT CARDIOVERSION N/A 07/12/2017   Procedure: TRANSESOPHAGEAL ECHOCARDIOGRAM (TEE);  Surgeon: Kerrin Elspeth BROCKS, MD;  Location: [facility] OR;  Service: Open Heart Surgery;  Laterality: N/A;   TEE WITHOUT CARDIOVERSION N/A 09/09/2017   Procedure: TRANSESOPHAGEAL ECHOCARDIOGRAM (TEE);  Surgeon: Francyne Headland, MD;  Location: [facility] ENDOSCOPY;  Service: Cardiovascular;  Laterality: N/A;   TEE WITHOUT CARDIOVERSION N/A 09/17/2017   Procedure: TRANSESOPHAGEAL ECHOCARDIOGRAM (TEE);  Surgeon: Kerrin Elspeth BROCKS, MD;  Location: [facility] OR;  Service: Open Heart Surgery;  Laterality: N/A;    Allergies Allergies  Allergen Reactions   Ace Inhibitors Cough   Codeine Itching and Rash     Home Medications  Prior to Admission medications   Medication Sig Start Date End Date Taking? Authorizing Provider  acetaminophen  (TYLENOL ) 650 MG CR tablet Take 1,300 mg by mouth every 8 (eight) hours as needed for pain.   Yes [provider]  atorvastatin  (LIPITOR) 20 MG tablet Take 20 mg by mouth daily.  11/25/12  Yes [provider]  calcium  carbonate (TUMS EX) 750 MG chewable tablet Chew 1 tablet by mouth daily.   Yes [provider]  Cholecalciferol  (VITAMIN D ) 50 MCG (2000 UT) tablet Take 2,000 Units by mouth daily.   Yes [provider]  Dextromethorphan-guaiFENesin (MUCINEX DM PO) Take 1 tablet by mouth as needed.   Yes [provider]  diphenhydrAMINE  HCl (BENADRYL  ALLERGY PO) Take 0.5 tablets by mouth as needed.   Yes [provider]  Famotidine  (PEPCID  PO) Take 1 tablet by mouth as needed.   Yes [provider]  fexofenadine (ALLEGRA) 60 MG tablet Take  60 mg by mouth daily.   Yes [provider]  hydroxychloroquine  (PLAQUENIL ) 200 MG tablet Take 400 mg by mouth daily.  03/03/13  Yes [provider]  metoprolol  tartrate (LOPRESSOR ) 50 MG tablet TAKE 1 TABLET TWICE A DAY (PLEASE SCHEDULE OFFICE VISIT FOR REFILLS) Patient taking differently: Take 50 mg by mouth 2 (two) times daily. (PLEASE SCHEDULE OFFICE VISIT  FOR REFILLS) 01/10/21  Yes Hilty, Vinie BROCKS, MD  Omega-3 Fatty Acids (FISH OIL) 1000 MG CAPS Take 2 capsules by mouth daily.   Yes [provider]  TALTZ 80 MG/ML pen Inject 80 mg into the skin every 28 (twenty-eight) days. 07/04/21  Yes [provider]  tamsulosin  (FLOMAX ) 0.4 MG CAPS capsule Take 0.4 mg by mouth daily after breakfast.    Yes [provider]  COSENTYX SENSOREADY PEN 150 MG/ML SOAJ Inject 150 mg into the skin every 30 (thirty) days. Patient not taking: Reported on 04/19/2024 11/09/18   [provider]     Critical care time:     CRITICAL CARE Performed by: [name]   Total critical care time: 45 minutes  Critical care time was exclusive of separately billable procedures and treating other patients.  Critical care was necessary to treat or prevent imminent or life-threatening deterioration.  Critical care was time spent personally by me on the following activities: development of treatment plan with patient and/or surrogate as well as nursing, discussions with consultants, evaluation of patient's response to treatment, examination of patient, obtaining history from patient or surrogate, ordering and performing treatments and interventions, ordering and review of laboratory studies, ordering and review of radiographic studies, pulse oximetry and re-evaluation of patient's condition.    [name], NP [facility] Apr 28, 2024, 8:45 AM  See Amion for pager If no response to pager , please call [phone] until 7pm After 7:00 pm call Elink   [phone]

## 2024-04-26 NOTE — Anesthesia Postprocedure Evaluation (Signed)
 Anesthesia Post Note  Patient: Luis Ferguson  Procedure(s) Performed: THROMBECTOMY, ARTERY, FEMORAL (Left) FASCIOTOMY, CALF (Left: Leg Lower)     Patient location during evaluation: ICU Anesthesia Type: General Level of consciousness: patient remains intubated per anesthesia plan and sedated Pain management: pain level controlled Vital Signs Assessment: post-procedure vital signs reviewed and stable Respiratory status: patient remains intubated per anesthesia plan and patient on ventilator - see flowsheet for VS Cardiovascular status: blood pressure returned to baseline and stable Postop Assessment: no apparent nausea or vomiting Anesthetic complications: no   No notable events documented.  Last Vitals:  Vitals:   Apr 29, 2024 1200 04/29/2024 1405  BP: (!) 89/71   Pulse: (!) 144   Resp: 18 18  Temp: 37.9 C   SpO2: 99%     Last Pain:  Vitals:   04/29/24 0830  TempSrc: Bladder  PainSc:                  Epifanio Lamar BRAVO

## 2024-04-26 NOTE — Progress Notes (Addendum)
 Family present and wife ready to transition to full comfort and one way palliative extubation.  PCCM comfort orders placed, ongoing emotional support.

## 2024-04-26 NOTE — Death Summary Note (Signed)
 DEATH SUMMARY   Patient Details  Name: Luis Ferguson MRN: [id number] DOB: 1961/03/29  Admission/Discharge Information   Admit Date:  May 13, 2024  Date of Death: Date of Death: 05/15/2024  Time of Death: Time of Death: 1543/05/26  Length of Stay: 2  Referring Physician: Redmon, Noelle, PA   Reason(s) for Hospitalization  Critical limb ischemia  Diagnoses  Preliminary cause of death:  Secondary Diagnoses (including complications and co-morbidities):  Principal Problem:   AKI (acute kidney injury) Active Problems:   Aortic stenosis   Dyslipidemia   Lupus (systemic lupus erythematosus) (HCC)   Aortic valve regurgitation   S/P AVR   Symptomatic anemia   GERD (gastroesophageal reflux disease)   Perivalvular leak of prosthetic heart valve   Benign prostatic hyperplasia without urinary obstruction   Chronic kidney disease due to hypertension   Acute hypoxic respiratory failure (HCC)   MSSA bacteremia   Coronavirus infection Septic shock secondary to MSSA bacteremia Cardiogenic shock secondary endocarditis Large acute right MCA stroke with septic emboli and brain swelling present AHRF 2/2 coronovirus HKU1 AKI. No hx CKD Acute blood loss anemia Coagulopathy Thrombocytopenia Brief Hospital Course (including significant findings, care, treatment, and services provided and events leading to death)  [name] is a 63 year old male with acute limb ischemia of LLE s/p popliteal and tibial thrombectomy and fasciotomies x 4 compartments admitted to ICU overnight. On levophed  and vasopressin . Overnight received PRBC x 2U and plt x 2U for Hg dropping 10.8>7.3 for possible GIB. Hospital course was complicated by MSSA bacteremia, AV s/p TAVR with endocarditis, new heart failure, right MCA stroke with septic emboli and AKI. After discussion with Critical Care and Stroke team, wife has transitioned patient to DNR yesterday and this morning did not want any further escalation with care with plan  to transition to comfort care. Patient expired at 1544 on 11/20/23.  Pertinent Labs and Studies  Significant Diagnostic Studies MR ANGIO HEAD WO CONTRAST Result Date: 05-15-24 EXAM: MR Angiography Head without intravenous Contrast. 05-15-24 02:18:00 AM TECHNIQUE: Magnetic resonance angiography images of the head without intravenous contrast. Multiplanar 2D and 3D reformatted images are provided for review. COMPARISON: None provided. CLINICAL HISTORY: Stroke, follow up. FINDINGS: ANTERIOR CIRCULATION: No significant stenosis of the internal carotid arteries. No significant stenosis of the anterior cerebral arteries. There is abrupt occlusion of the proximal M1 segment of the right middle cerebral artery. No aneurysm. POSTERIOR CIRCULATION: No significant stenosis of the posterior cerebral arteries. No significant stenosis of the basilar artery. No significant stenosis of the vertebral arteries. No aneurysm. IMPRESSION: 1. Abrupt occlusion of the proximal M1 segment of the right middle cerebral artery. Electronically signed by: Evalene Coho MD 05-15-24 05:37 AM EDT RP Workstation: [id number]   MR BRAIN WO CONTRAST Result Date: May 15, 2024 EXAM: MRI BRAIN WITHOUT CONTRAST 05/15/2024 02:18:00 AM TECHNIQUE: Multiplanar multisequence MRI of the head/brain was performed without the administration of intravenous contrast. COMPARISON: CT of the head dated 04/20/2024. CLINICAL HISTORY: Stroke, follow up. FINDINGS: BRAIN AND VENTRICLES: There is restricted diffusion present throughout the right middle cerebral artery territory distribution. There are also numerous foci of restricted diffusion within the cortex of the left cerebral hemisphere, left cerebellar hemisphere, and right occipital lobe, compatible with embolic infarcts. There is moderate right cerebral swelling with effacement of the sulci and partial effacement of the lateral ventricle. There is approximately 3 mm shift of midline structures to the  left. No intracranial hemorrhage. No mass. No hydrocephalus. There is a prominent cisterna magna.  The sella is unremarkable. Normal flow voids. ORBITS: No acute abnormality. SINUSES AND MASTOIDS: No acute abnormality. BONES AND SOFT TISSUES: Normal marrow signal. No acute soft tissue abnormality. IMPRESSION: 1. Acute infarction throughout the right middle cerebral artery territory. 2. Additional multifocal acute embolic infarcts within the left cerebral hemisphere cortex, left cerebellar hemisphere, and right occipital lobe. 3. Moderate right hemispheric edema with sulcal and partial lateral ventricular effacement, with approximately 3 mm leftward midline shift. Electronically signed by: Evalene Coho MD 08-May-2024 05:34 AM EDT RP Workstation: [id number]   ECHOCARDIOGRAM COMPLETE Result Date: 04/20/2024    ECHOCARDIOGRAM REPORT   Patient Name:   Luis Ferguson Date of Exam: 04/20/2024 Medical Rec #:  [id number]        Height:       67.2 in Accession #:    [id number]       Weight:       141.0 lb Date of Birth:  [birth year]         BSA:          1.747 m Patient Age:    63 years         BP:           95/80 mmHg Patient Gender: M                HR:           88 bpm. Exam Location:  Inpatient Procedure: 2D Echo, Cardiac Doppler and Color Doppler (Both Spectral and Color            Flow Doppler were utilized during procedure). Indications:    Endocarditis  History:        Patient has prior history of Echocardiogram examinations, most                 recent 12/09/2018. Risk Factors:Hypertension and Dyslipidemia.                 Aortic Valve: 23 mm Edwards Sapien prosthetic, stented (TAVR)                 valve is present in the aortic position. Procedure Date:                 09/17/2017.  Sonographer:    Philomena Daring Referring Phys: [id number] MATTHEW EVELAND  Sonographer Comments: Patient is intubated IMPRESSIONS  1. Left ventricular ejection fraction, by estimation, is 35 to 40%. The left ventricle has moderately  decreased function. The left ventricle demonstrates global hypokinesis. There is mild concentric left ventricular hypertrophy. Left ventricular diastolic parameters are consistent with Grade II diastolic dysfunction (pseudonormalization).  2. Right ventricular systolic function is moderately reduced. The right ventricular size is normal. There is normal pulmonary artery systolic pressure. The estimated right ventricular systolic pressure is 24.9 mmHg.  3. The mitral valve is thickened/calcified, no defnite vegetation. The mitral valve is degenerative. Trivial mitral valve regurgitation. No evidence of mitral stenosis.  4. Bioprosthetic aortic valve s/p TAVR. Possible small mobile vegetation on the atrial side of the valve noted on parasternal long axis views. The valve itself appears thickened, cannot rule out vegetation on leaflets but not well-visualized. Mean gradient not elevated. No significant regurgitation noted.  5. The inferior vena cava is dilated in size with <50% respiratory variability, suggesting right atrial pressure of 15 mmHg.  6. Would recommend assessment of aortic valve by TEE, possible vegetation. FINDINGS  Left Ventricle: Left ventricular ejection fraction, by estimation, is 35 to 40%. The left  ventricle has moderately decreased function. The left ventricle demonstrates global hypokinesis. The left ventricular internal cavity size was normal in size. There is mild concentric left ventricular hypertrophy. Left ventricular diastolic parameters are consistent with Grade II diastolic dysfunction (pseudonormalization). Right Ventricle: The right ventricular size is normal. No increase in right ventricular wall thickness. Right ventricular systolic function is moderately reduced. There is normal pulmonary artery systolic pressure. The tricuspid regurgitant velocity is 1.57 m/s, and with an assumed right atrial pressure of 15 mmHg, the estimated right ventricular systolic pressure is 24.9 mmHg. Left  Atrium: Left atrial size was normal in size. Right Atrium: Right atrial size was normal in size. Pericardium: There is no evidence of pericardial effusion. Mitral Valve: The mitral valve is thickened/calcified, no defnite vegetation. The mitral valve is degenerative in appearance. There is moderate thickening of the mitral valve leaflet(s). There is mild calcification of the mitral valve leaflet(s). Trivial  mitral valve regurgitation. No evidence of mitral valve stenosis. Tricuspid Valve: The tricuspid valve is normal in structure. Tricuspid valve regurgitation is trivial. Aortic Valve: Bioprosthetic aortic valve s/p TAVR. Possible small mobile vegetation on the atrial side of the valve noted on parasternal long axis views. The valve itself appears thickened, cannot rule out vegetation on leaflets but not well-visualized. Mean gradient not elevated. No significant regurgitation noted. There is a 23 mm Edwards Sapien prosthetic, stented (TAVR) valve present in the aortic position. Procedure Date: 09/17/2017. Pulmonic Valve: The pulmonic valve was not well visualized. Pulmonic valve regurgitation is not visualized. Aorta: The aortic root is normal in size and structure. Venous: The inferior vena cava is dilated in size with less than 50% respiratory variability, suggesting right atrial pressure of 15 mmHg. IAS/Shunts: No atrial level shunt detected by color flow Doppler.  LEFT VENTRICLE PLAX 2D LVIDd:         5.00 cm      Diastology LVIDs:         3.60 cm      LV e' medial:    4.24 cm/s LV PW:         1.20 cm      LV E/e' medial:  13.7 LV IVS:        1.20 cm      LV e' lateral:   4.79 cm/s LVOT diam:     2.00 cm      LV E/e' lateral: 12.2 LV SV:         33 LV SV Index:   19 LVOT Area:     3.14 cm  LV Volumes (MOD) LV vol d, MOD A2C: 79.6 ml LV vol d, MOD A4C: 123.0 ml LV vol s, MOD A2C: 45.1 ml LV vol s, MOD A4C: 68.7 ml LV SV MOD A2C:     34.5 ml LV SV MOD A4C:     123.0 ml LV SV MOD BP:      46.0 ml RIGHT VENTRICLE             IVC RV S prime:     5.87 cm/s  IVC diam: 2.20 cm TAPSE (M-mode): 1.0 cm LEFT ATRIUM             Index        RIGHT ATRIUM           Index LA diam:        3.90 cm 2.23 cm/m   RA Area:     10.10 cm LA Vol (A2C):   34.9 ml 19.98 ml/m  RA Volume:   20.30 ml  11.62 ml/m LA Vol (A4C):   24.8 ml 14.20 ml/m LA Biplane Vol: 32.6 ml 18.66 ml/m  AORTIC VALVE LVOT Vmax:   66.00 cm/s LVOT Vmean:  41.900 cm/s LVOT VTI:    0.104 m  AORTA Ao Root diam: 2.70 cm Ao Asc diam:  3.30 cm MITRAL VALVE               TRICUSPID VALVE MV Area (PHT): 4.68 cm    TR Peak grad:   9.9 mmHg MV Decel Time: 162 msec    TR Vmax:        157.00 cm/s MV E velocity: 58.20 cm/s MV A velocity: 44.00 cm/s  SHUNTS MV E/A ratio:  1.32        Systemic VTI:  0.10 m                            Systemic Diam: 2.00 cm Dalton McleanMD Electronically signed by Ezra Kanner Signature Date/Time: 04/20/2024/5:48:19 PM    Final    CT HEAD WO CONTRAST ( ) Result Date: 04/20/2024 CLINICAL DATA:  Altered mental status. EXAM: CT HEAD WITHOUT CONTRAST TECHNIQUE: Contiguous axial images were obtained from the base of the skull through the vertex without intravenous contrast. RADIATION DOSE REDUCTION: This exam was performed according to the departmental dose-optimization program which includes automated exposure control, adjustment of the mA and/or kV according to patient size and/or use of iterative reconstruction technique. COMPARISON:  None Available. FINDINGS: Brain: Ventricles are within normal. Prominence of the cisterna magna. There is a large area of low attenuation over the [place] white matter involving the right MCA territory with effacement of the associated sulci compatible with subacute infarction. Mild chronic ischemic microvascular disease is present. No evidence of midline shift. No acute hemorrhage. Vascular: No hyperdense vessel or unexpected calcification. Skull: Normal. Negative for fracture or focal lesion. Sinuses/Orbits: Paranasal  sinuses are well aerated with mild opacification over the right frontal sinus, frontoethmoidal recess and ethmoid air cells. Minimal mucosal membrane thickening over the right maxillary sinus. Orbits are normal. Other: None. IMPRESSION: 1. Large area of low attenuation over the right MCA territory compatible with subacute infarct. Mild associated local mass effect. No acute hemorrhage. 2. Mild chronic ischemic microvascular disease. 3. Mild chronic sinus inflammatory disease. Electronically Signed   By: Toribio Agreste M.D.   On: 04/20/2024 12:18   DG Chest Portable 1 View Result Date: 04/20/2024 EXAM: 1 VIEW(S) XRAY OF THE CHEST 04/20/2024 12:23:00 AM COMPARISON: 04/19/2024 CLINICAL HISTORY: eval for ett. Encounter for ETT and OG tube, Dr. By bedside FINDINGS: LINES, TUBES AND DEVICES: Endotracheal tube terminates 4 cm above the carina. Enteric tube courses into the mid gastric body. LUNGS AND PLEURA: No focal pulmonary opacity. No pulmonary edema. No pleural effusion. No pneumothorax. HEART AND MEDIASTINUM: No acute abnormality of the cardiac and mediastinal silhouettes. BONES AND SOFT TISSUES: No acute osseous abnormality. IMPRESSION: 1. Endotracheal tube in appropriate position. 2. Enteric tube in appropriate position. 3. Otherwise unchanged. Electronically signed by: Pinkie Pebbles MD 04/20/2024 12:29 AM EDT RP Workstation: [id number]   DG Chest Port 1 View Result Date: 04/19/2024 EXAM: 1 VIEW(S) XRAY OF THE CHEST 04/19/2024 11:41:00 PM COMPARISON: 04/19/2024 CLINICAL HISTORY: acute sob. BIB EMS from home r/t worsening cold symptoms and weakness x1 weak. Cough, congestion, N/V. Denies pain. Hx Lupus. For EMS patient BP 90/60 and EMS administered 500ml LR with improved BP. A\T\Ox4. FINDINGS: LUNGS  AND PLEURA: No focal pulmonary opacity. No pulmonary edema. No pleural effusion. No pneumothorax. HEART AND MEDIASTINUM: Prosthetic aortic valve. Median sternotomy. BONES AND SOFT TISSUES: Status post ORIF of a  right anterior rib. Degenerative changes of the bilateral shoulders, right greater than left. IMPRESSION: 1. No acute abnormalities. Electronically signed by: Pinkie Pebbles MD 04/19/2024 11:45 PM EDT RP Workstation: [id number]   CT CHEST WO CONTRAST Result Date: 04/19/2024 CLINICAL DATA:  Respiratory illness EXAM: CT CHEST WITHOUT CONTRAST TECHNIQUE: Multidetector CT imaging of the chest was performed following the standard protocol without IV contrast. RADIATION DOSE REDUCTION: This exam was performed according to the departmental dose-optimization program which includes automated exposure control, adjustment of the mA and/or kV according to patient size and/or use of iterative reconstruction technique. COMPARISON:  CT abdomen and pelvis 04/19/2024. CTA chest 06/30/2017. FINDINGS: Cardiovascular: No significant vascular findings. Normal heart size. No pericardial effusion. Aortic valve replacement present. There are atherosclerotic calcifications of the aorta. Mediastinum/Nodes: No enlarged mediastinal or axillary lymph nodes. Thyroid  gland, trachea, and esophagus demonstrate no significant findings. There is a small hiatal hernia. Lungs/Pleura: 3 mm fissural nodules on the right appear unchanged from 2018 favored as benign. No new pulmonary nodules. No focal lung infiltrate, pleural effusion or pneumothorax. Upper Abdomen: Gallstones are present. Musculoskeletal: No acute fracture.  Sternotomy wires are present. IMPRESSION: 1. No acute cardiopulmonary process. 2. Cholelithiasis. 3. Small hiatal hernia. 4. Aortic atherosclerosis. Aortic Atherosclerosis (ICD10-I70.0). Electronically Signed   By: Greig Pique M.D.   On: 04/19/2024 18:50   CT ABDOMEN PELVIS WO CONTRAST Result Date: 04/19/2024 CLINICAL DATA:  Cold symptoms and weakness 1 week.  Abdominal pain. EXAM: CT ABDOMEN AND PELVIS WITHOUT CONTRAST TECHNIQUE: Multidetector CT imaging of the abdomen and pelvis was performed following the standard  protocol without IV contrast. RADIATION DOSE REDUCTION: This exam was performed according to the departmental dose-optimization program which includes automated exposure control, adjustment of the mA and/or kV according to patient size and/or use of iterative reconstruction technique. COMPARISON:  07/13/2019 FINDINGS: Lower chest: Heart is normal size. Subtle calcified plaque over the descending thoracic aorta. Possible small hiatal hernia unchanged. Median sternotomy wires are present. Lung bases are clear. Hepatobiliary: Minimal cholelithiasis. Liver and biliary tree are otherwise unremarkable. Pancreas: Suggestion calcifications over the pancreas likely reflecting evidence of previous pancreatitis. No peripancreatic inflammation or fluid. Spleen: Normal. Adrenals/Urinary Tract: Adrenal glands are normal. Kidneys are normal size. Several left renal cysts unchanged. No follow-up imaging is recommended. No evidence of hydronephrosis or nephrolithiasis. Ureters are normal. Minimal bladder distension as the bladder is otherwise unremarkable. Stomach/Bowel: Small hiatal hernia as the stomach is otherwise unremarkable. Small bowel is unremarkable. Appendix is not visualized. Very minimal diverticulosis of the colon which is otherwise unremarkable. Vascular/Lymphatic: Calcified plaque over the abdominal aorta which is otherwise normal in caliber. Remaining vascular structures are unremarkable. No adenopathy. Reproductive: Prostate is unremarkable. Other: Findings suggesting previous lower abdominal and right inguinal hernia repair. No free fluid or focal inflammatory change. Musculoskeletal: Significant degenerative changes of the spine with curvature of the lumbar spine convex left and multilevel disc disease throughout the lumbar spine. IMPRESSION: 1. No acute findings in the abdomen/pelvis. 2. Minimal cholelithiasis. 3. Small hiatal hernia. 4. Minimal colonic diverticulosis. 5. Left renal cysts.  No further imaging  follow-up is recommended. 6. Aortic atherosclerosis. Aortic Atherosclerosis (ICD10-I70.0). Electronically Signed   By: Toribio Agreste M.D.   On: 04/19/2024 14:14   DG Chest 2 View Result Date: 04/19/2024 CLINICAL DATA:  Cough. EXAM:  CHEST - 2 VIEW COMPARISON:  06/05/2019 and CT chest 06/30/2017. FINDINGS: Trachea is midline. Heart size normal. Lungs are low in volume with minimal streaky atelectasis. No airspace consolidation or pleural fluid. Degenerative changes in the shoulders. S shaped scoliosis. IMPRESSION: Low lung volumes with minimal basilar atelectasis. Electronically Signed   By: Newell Eke M.D.   On: 04/19/2024 12:37    Microbiology Recent Results (from the past 240 hours)  Resp panel by RT-PCR (RSV, Flu A&B, Covid) Anterior Nasal Swab     Status: None   Collection Time: 04/19/24 12:19 PM   Specimen: Anterior Nasal Swab  Result Value Ref Range Status   SARS Coronavirus 2 by RT PCR NEGATIVE NEGATIVE Final   Influenza A by PCR NEGATIVE NEGATIVE Final   Influenza B by PCR NEGATIVE NEGATIVE Final    Comment: (NOTE) The Xpert Xpress SARS-CoV-2/FLU/RSV plus assay is intended as an aid in the diagnosis of influenza from Nasopharyngeal swab specimens and should not be used as a sole basis for treatment. Nasal washings and aspirates are unacceptable for Xpert Xpress SARS-CoV-2/FLU/RSV testing.  Fact Sheet for Patients: BloggerCourse.com  Fact Sheet for Healthcare Providers: SeriousBroker.it  This test is not yet approved or cleared by the [place]  FDA and has been authorized for detection and/or diagnosis of SARS-CoV-2 by FDA under an Emergency Use Authorization (EUA). This EUA will remain in effect (meaning this test can be used) for the duration of the COVID-19 declaration under Section 564(b)(1) of the Act, 21 U.S.C. section 360bbb-3(b)(1), unless the authorization is terminated or revoked.     Resp Syncytial Virus  by PCR NEGATIVE NEGATIVE Final    Comment: (NOTE) Fact Sheet for Patients: BloggerCourse.com  Fact Sheet for Healthcare Providers: SeriousBroker.it  This test is not yet approved or cleared by the [place]  FDA and has been authorized for detection and/or diagnosis of SARS-CoV-2 by FDA under an Emergency Use Authorization (EUA). This EUA will remain in effect (meaning this test can be used) for the duration of the COVID-19 declaration under Section 564(b)(1) of the Act, 21 U.S.C. section 360bbb-3(b)(1), unless the authorization is terminated or revoked.  Performed at [facility], [address] [place], [place] [address]   Respiratory (~20 pathogens) panel by PCR     Status: Abnormal   Collection Time: 04/19/24 12:19 PM   Specimen: Nasopharyngeal Swab; Respiratory  Result Value Ref Range Status   Adenovirus NOT DETECTED NOT DETECTED Final   Coronavirus 229E NOT DETECTED NOT DETECTED Final    Comment: (NOTE) The Coronavirus on the Respiratory Panel, DOES NOT test for the novel  Coronavirus (2019 nCoV)    Coronavirus HKU1 DETECTED (A) NOT DETECTED Final   Coronavirus NL63 NOT DETECTED NOT DETECTED Final   Coronavirus OC43 NOT DETECTED NOT DETECTED Final   Metapneumovirus NOT DETECTED NOT DETECTED Final   Rhinovirus / Enterovirus NOT DETECTED NOT DETECTED Final   Influenza A NOT DETECTED NOT DETECTED Final   Influenza B NOT DETECTED NOT DETECTED Final   Parainfluenza Virus 1 NOT DETECTED NOT DETECTED Final   Parainfluenza Virus 2 NOT DETECTED NOT DETECTED Final   Parainfluenza Virus 3 NOT DETECTED NOT DETECTED Final   Parainfluenza Virus 4 NOT DETECTED NOT DETECTED Final   Respiratory Syncytial Virus NOT DETECTED NOT DETECTED Final   Bordetella pertussis NOT DETECTED NOT DETECTED Final   Bordetella Parapertussis NOT DETECTED NOT DETECTED Final   Chlamydophila pneumoniae NOT DETECTED NOT DETECTED Final   Mycoplasma  pneumoniae NOT DETECTED NOT  DETECTED Final    Comment: Performed at [facility], [address] [place], [place] [address]  Culture, blood (single)     Status: Abnormal   Collection Time: 04/19/24 11:20 PM   Specimen: BLOOD  Result Value Ref Range Status   Specimen Description BLOOD SITE NOT SPECIFIED  Final   Special Requests   Final    BOTTLES DRAWN AEROBIC AND ANAEROBIC Blood Culture results may not be optimal due to an inadequate volume of blood received in culture bottles   Culture  Setup Time   Final    GRAM POSITIVE COCCI IN CLUSTERS IN BOTH AEROBIC AND ANAEROBIC BOTTLES CRITICAL VALUE NOTED.  VALUE IS CONSISTENT WITH PREVIOUSLY REPORTED AND CALLED VALUE.    Culture (A)  Final    STAPHYLOCOCCUS AUREUS SUSCEPTIBILITIES PERFORMED ON PREVIOUS CULTURE WITHIN THE LAST 5 DAYS. Performed at [facility], [address], [place], [place] [address]    Report Status 04/22/2024 FINAL  Final  Culture, blood (Routine X 2) w Reflex to ID Panel     Status: Abnormal   Collection Time: 04/20/24 12:15 AM   Specimen: BLOOD RIGHT ARM  Result Value Ref Range Status   Specimen Description BLOOD RIGHT ARM  Final   Special Requests   Final    BOTTLES DRAWN AEROBIC AND ANAEROBIC Blood Culture adequate volume   Culture  Setup Time   Final    GRAM POSITIVE COCCI IN CLUSTERS IN BOTH AEROBIC AND ANAEROBIC BOTTLES CRITICAL RESULT CALLED TO, READ BACK BY AND VERIFIED WITH: PHARMD A JAMINEZ [id number] AT 950 AM BY CM Performed at [facility], [address], [place], [place] [address]    Culture STAPHYLOCOCCUS AUREUS (A)  Final   Report Status 04/22/2024 FINAL  Final   Organism ID, Bacteria STAPHYLOCOCCUS AUREUS  Final      Susceptibility   Staphylococcus aureus - MIC*    CIPROFLOXACIN <=0.5 SENSITIVE Sensitive     ERYTHROMYCIN <=0.25 SENSITIVE Sensitive     GENTAMICIN <=0.5 SENSITIVE Sensitive     OXACILLIN 0.5 SENSITIVE Sensitive     TETRACYCLINE <=1 SENSITIVE Sensitive      VANCOMYCIN  <=0.5 SENSITIVE Sensitive     TRIMETH/SULFA <=10 SENSITIVE Sensitive     CLINDAMYCIN <=0.25 SENSITIVE Sensitive     RIFAMPIN <=0.5 SENSITIVE Sensitive     Inducible Clindamycin NEGATIVE Sensitive     LINEZOLID 2 SENSITIVE Sensitive     * STAPHYLOCOCCUS AUREUS  Blood Culture ID Panel (Reflexed)     Status: Abnormal   Collection Time: 04/20/24 12:15 AM  Result Value Ref Range Status   Enterococcus faecalis NOT DETECTED NOT DETECTED Final   Enterococcus Faecium NOT DETECTED NOT DETECTED Final   Listeria monocytogenes NOT DETECTED NOT DETECTED Final   Staphylococcus species DETECTED (A) NOT DETECTED Final    Comment: CRITICAL RESULT CALLED TO, READ BACK BY AND VERIFIED WITH: PHARMD A JAMINEZ [id number] AT 950 AM BY CM    Staphylococcus aureus (BCID) DETECTED (A) NOT DETECTED Final    Comment: CRITICAL RESULT CALLED TO, READ BACK BY AND VERIFIED WITH: PHARMD A JAMINEZ [id number] AT 950 AM BY CM    Staphylococcus epidermidis NOT DETECTED NOT DETECTED Final   Staphylococcus lugdunensis NOT DETECTED NOT DETECTED Final   Streptococcus species NOT DETECTED NOT DETECTED Final   Streptococcus agalactiae NOT DETECTED NOT DETECTED Final   Streptococcus pneumoniae NOT DETECTED NOT DETECTED Final   Streptococcus pyogenes NOT DETECTED NOT DETECTED Final   A.calcoaceticus-baumannii NOT DETECTED NOT  DETECTED Final   Bacteroides fragilis NOT DETECTED NOT DETECTED Final   Enterobacterales NOT DETECTED NOT DETECTED Final   Enterobacter cloacae complex NOT DETECTED NOT DETECTED Final   Escherichia coli NOT DETECTED NOT DETECTED Final   Klebsiella aerogenes NOT DETECTED NOT DETECTED Final   Klebsiella oxytoca NOT DETECTED NOT DETECTED Final   Klebsiella pneumoniae NOT DETECTED NOT DETECTED Final   Proteus species NOT DETECTED NOT DETECTED Final   Salmonella species NOT DETECTED NOT DETECTED Final   Serratia marcescens NOT DETECTED NOT DETECTED Final   Haemophilus influenzae NOT DETECTED NOT DETECTED  Final   Neisseria meningitidis NOT DETECTED NOT DETECTED Final   Pseudomonas aeruginosa NOT DETECTED NOT DETECTED Final   Stenotrophomonas maltophilia NOT DETECTED NOT DETECTED Final   Candida albicans NOT DETECTED NOT DETECTED Final   Candida auris NOT DETECTED NOT DETECTED Final   Candida glabrata NOT DETECTED NOT DETECTED Final   Candida krusei NOT DETECTED NOT DETECTED Final   Candida parapsilosis NOT DETECTED NOT DETECTED Final   Candida tropicalis NOT DETECTED NOT DETECTED Final   Cryptococcus neoformans/gattii NOT DETECTED NOT DETECTED Final   Meth resistant mecA/C and MREJ NOT DETECTED NOT DETECTED Final    Comment: Performed at [facility], [address], [place], [place] [address]  MRSA Next Gen by PCR, Nasal     Status: None   Collection Time: 04/20/24  1:06 AM   Specimen: Nasal Mucosa; Nasal Swab  Result Value Ref Range Status   MRSA by PCR Next Gen NOT DETECTED NOT DETECTED Final    Comment: (NOTE) The GeneXpert MRSA Assay (FDA approved for NASAL specimens only), is one component of a comprehensive MRSA colonization surveillance program. It is not intended to diagnose MRSA infection nor to guide or monitor treatment for MRSA infections. Test performance is not FDA approved in patients less than 3 years old. Performed at [facility], [address] [place], [place] [address]   Culture, blood (Routine X 2) w Reflex to ID Panel     Status: None   Collection Time: 04/20/24  8:04 AM   Specimen: BLOOD LEFT ARM  Result Value Ref Range Status   Specimen Description BLOOD LEFT ARM  Final   Special Requests   Final    BOTTLES DRAWN AEROBIC AND ANAEROBIC Blood Culture adequate volume   Culture   Final    NO GROWTH 5 DAYS Performed at [facility], [address] [place], [place] [address]    Report Status 04/25/2024 FINAL  Final    Lab Basic Metabolic Panel: Recent Labs  Lab 04/19/24 1219 04/19/24 2327 04/20/24 0017 04/20/24 0050  04/20/24 0147 04/20/24 0456 04/20/24 9375 04/20/24 1935 04/26/2024 0252 Apr 26, 2024 0735 04/26/2024 0812  NA 132*   < > 136 138 136 138 139 141 144 152* 148*  K 3.8   < > 3.7 3.6 3.5 3.7 3.3*  --   --   --  3.6  CL 97*  --  106  --  107  --   --   --   --   --  119*  CO2 19*  --  15*  --  15*  --   --   --   --   --  17*  GLUCOSE 106*  --  104*  --  102*  --   --   --   --   --  143*  BUN 95*  --  112*  --  110*  --   --   --   --   --  120*  CREATININE 3.44*  --  3.64*  --  3.61*  --   --   --   --   --  3.27*  CALCIUM  8.0*  --  7.0*  --  6.8*  --   --   --   --   --  6.7*  MG  --   --   --   --  2.1  --   --   --   --  2.6*  --   PHOS  --   --   --   --  4.8*  --   --   --   --   --  6.9*   < > = values in this interval not displayed.   Liver Function Tests: Recent Labs  Lab 04/19/24 1219 04/20/24 0017 04/20/24 0147 May 12, 2024 0812  AST 160* 246* 223* 165*  ALT 98* 115* 103* 87*  ALKPHOS 140* 112 95 100  BILITOT 1.2 1.1 1.2 1.7*  PROT 6.3* 4.9* 4.3* 4.4*  ALBUMIN  2.5* 1.9* 1.6* 1.9*   Recent Labs  Lab 04/19/24 1219  LIPASE 19   No results for input(s): AMMONIA in the last 168 hours. CBC: Recent Labs  Lab 04/19/24 1219 04/19/24 2327 04/20/24 0017 04/20/24 0050 04/20/24 0806 04/20/24 1400 04/20/24 2300 2024-05-12 0252 May 12, 2024 0800 12-May-2024 1353  WBC 9.2  --  7.1   < > 9.7 8.6 11.2* 10.3 11.4* 10.6*  NEUTROABS 8.8*  --  6.3  --  8.6*  --   --   --   --   --   HGB 10.8*   < > 8.8*   < > 8.7* 9.0* 10.5* 10.2* 10.0* 9.9*  HCT 32.0*   < > 26.0*   < > 25.3* 25.5* 29.6* 28.8* 28.1* 28.5*  MCV 84.2  --  84.7   < > 83.5 83.1 82.0 82.5 81.9 84.1  PLT 49*  --  33*   < > 38* 26* 22* 16* 15* 13*   < > = values in this interval not displayed.   Cardiac Enzymes: No results for input(s): CKTOTAL, CKMB, CKMBINDEX, TROPONINI in the last 168 hours. Sepsis Labs: Recent Labs  Lab 04/20/24 0030 04/20/24 0147 04/20/24 0228 04/20/24 0806 04/20/24 1518 04/20/24 2300  2024/05/12 0252 May 12, 2024 0535 05-12-2024 0800 05/12/24 1353  WBC  --    < >  --    < >  --  11.2* 10.3  --  11.4* 10.6*  LATICACIDVEN 3.4*  --  4.5*  --  2.7*  --   --  1.8  --   --    < > = values in this interval not displayed.     Mardene Lessig Slater Staff 04/25/2024, 5:00 PM

## 2024-04-26 NOTE — IPAL (Signed)
  Interdisciplinary Goals of Care Family Meeting   Date carried out: 05-01-24  Location of the meeting: Bedside  Member's involved: Nurse Practitioner and Family Member or next of kin  Durable Power of Attorney or Environmental health practitioner: wife    Discussion: We discussed goals of care for [employer] .  Wife has decided to transition to comfort care but awaiting family arrival.  No escalation of care including vasopressor support, continue current gtts.  In the event of decompensation prior to family arrival, will transition sooner and proceed with one way palliative extubation. Ongoing emotional support.   Code status:   Code Status: Do not attempt resuscitation (DNR) - Comfort care   Disposition: Continue current acute care>  no escalation   Time spent for the meeting: 15 mins    [name], NP  05/01/2024, 11:08 AM

## 2024-04-26 NOTE — Progress Notes (Signed)
 OT Cancellation Note  Patient Details Name: Luis Ferguson MRN: [id number] DOB: 31-Dec-1960   Cancelled Treatment:    Reason Eval/Treat Not Completed: Patient declined, no reason specified (RASS -3 & Intubated, OT to f/u when appropriate)  Lucie JONETTA Kendall 26-Apr-2024, 9:22 AM

## 2024-04-26 NOTE — Procedures (Signed)
 Extubation Procedure Note  Patient Details:   Name: Luis Ferguson DOB: 1961-02-25 MRN: [id number]   Airway Documentation:  Airway 7.5 mm (Active)   Vent end date: (not recorded) Vent end time: (not recorded)   Evaluation  O2 sats: stable throughout Complications: No apparent complications Patient did tolerate procedure well. Bilateral Breath Sounds: Clear, Diminished   No, pt could not speak post extubation.  Pt extubated to room air per physician's order and in accordance with the family's wishes.    [name] 2024/05/14, 3:27 PM

## 2024-04-26 NOTE — Progress Notes (Addendum)
  Progress Note    05-16-2024 8:29 AM 1 Day Post-Op  Subjective:  intubated; responding to painful stimuli   Vitals:   05-16-2024 0500 05-16-2024 0600  BP:  105/82  Pulse: 100   Resp: 19 20  Temp: 98.4 F (36.9 C) 98.8 F (37.1 C)  SpO2: 100%    Physical Exam: Lungs:  mechanical ventilation Incisions:  L medial lower leg incision clean dry and intact; lateral lower leg incision with some sanguinous drainage Extremities: Palpable left DP pulse; calf soft Neurologic: Responding to painful stimuli  CBC    Component Value Date/Time   WBC 10.3 May 16, 2024 0252   RBC 3.49 (L) 05/16/2024 0252   HGB 10.2 (L) 05-16-24 0252   HGB 12.9 (L) 12/22/2018 1130   HCT 28.8 (L) 05/16/2024 0252   HCT 40.0 12/22/2018 1130   PLT 16 (LL) May 16, 2024 0252   PLT 172 12/22/2018 1130   MCV 82.5 2024-05-16 0252   MCV 91 12/22/2018 1130   MCH 29.2 16-May-2024 0252   MCHC 35.4 2024/05/16 0252   RDW 14.7 05/16/2024 0252   RDW 14.3 12/22/2018 1130   LYMPHSABS 0.8 04/20/2024 0806   MONOABS 0.2 04/20/2024 0806   EOSABS 0.0 04/20/2024 0806   BASOSABS 0.0 04/20/2024 0806    BMET    Component Value Date/Time   NA 144 16-May-2024 0252   NA 142 06/15/2017 1603   K 3.7 04/20/2024 0456   CL 107 04/20/2024 0147   CO2 15 (L) 04/20/2024 0147   GLUCOSE 102 (H) 04/20/2024 0147   BUN 110 (H) 04/20/2024 0147   BUN 12 06/15/2017 1603   CREATININE 3.61 (H) 04/20/2024 0147   CALCIUM  6.8 (L) 04/20/2024 0147   GFRNONAA 18 (L) 04/20/2024 0147   GFRAA >60 06/05/2019 1237    INR    Component Value Date/Time   INR 1.8 (H) 04/20/2024 0804     Intake/Output Summary (Last 24 hours) at 05-16-2024 0829 Last data filed at May 16, 2024 0600 Gross per 24 hour  Intake 2543.87 ml  Output 550 ml  Net 1993.87 ml     Assessment/Plan:  63 y.o. male is s/p left lower extremity thrombectomy with fasciotomies 1 Day Post-Op   Left lower extremity well-perfused with palpable DP pulse.  Calf is soft.  Dressings changed  this morning.  Lateral incision continues to have sanguinous drainage.  Can change as needed.  The patient is responding to painful stimuli however MRI demonstrates large right hemispheric infarct, left cerebellar infarct with 3 mm leftward midline shift.  Heparin  continues to be held.  Plans noted for goals of care discussion with family.   Luis Sender, PA-C [facility] [phone] 05-16-24 8:29 AM  I have seen and evaluated the patient. I agree with the PA note as documented above.  Left DP remains palpable after thrombectomy.  Discussed with critical care at bedside and patient had a large right MCA territory infarct with other acute embolic infarcts.  Likely transition to comfort care today.  Luis DOROTHA Gaskins, MD [facility] [place] Office: [phone]

## 2024-04-26 NOTE — Progress Notes (Signed)
 SLP Cancellation Note  Patient Details Name: Luis Ferguson MRN: [id number] DOB: Feb 17, 1961   Cancelled treatment:       Reason Eval/Treat Not Completed: Patient not medically ready (intubated). SLP will f/u as able.    Damien Blumenthal, M.A., CCC-SLP Speech Language Pathology, Acute Rehabilitation Services  Secure Chat preferred [phone]  29-Apr-2024, 10:27 AM

## 2024-04-26 NOTE — Progress Notes (Signed)
   2024/04/22 1200  Spiritual Encounters  Type of Visit Initial  Care provided to: Pt and family  Conversation partners present during encounter Nurse  Referral source Clinical staff  Reason for visit Urgent spiritual support  OnCall Visit No  Spiritual Framework  Presenting Themes Meaning/purpose/sources of inspiration;Coping tools;Courage hope and growth;Rituals and practive  Values/beliefs  (Christian)  Community/Connection Family;Friend(s);Faith community  Patient Stress Factors None identified  Family Stress Factors Loss of control;Loss;Major life changes  Interventions  Spiritual Care Interventions Made Established relationship of care and support;Compassionate presence;Decision-making support/facilitation;Explored ethical dilemma;Bereavement/grief support;Prayer  Intervention Outcomes  Outcomes Connection to spiritual care;Reduced anxiety;Reduced isolation;Patient family open to resources   Staff Chaplain and Resident Chaplain visited pt at bedside this afternoon - Patient was surrounded by his wife and two family members at bedside.  Prayers were provided for comfort and  concern addressing their anticipatory grief at this time.  Anointed atient with oil at their request.  Provided comforting counsel for patient and family.

## 2024-04-26 NOTE — Progress Notes (Signed)
 PT Cancellation Note  Patient Details Name: Luis Ferguson MRN: [id number] DOB: 02-21-1961   Cancelled Treatment:    Reason Eval/Treat Not Completed: Medical issues which prohibited therapy; per RN pt intubated and not yet appropriate for PT.  Will follow up another day.   Luis Ferguson May 05, 2024, 9:20 AM [name], PT Acute Rehabilitation Services Office:[phone] 05/05/24

## 2024-04-26 NOTE — Progress Notes (Signed)
 eLink Physician-Brief Progress Note Patient Name: Luis Ferguson DOB: Jan 27, 1961 MRN: [id number]   Date of Service  Apr 25, 2024  HPI/Events of Note  Platelet count now 16 K.  eICU Interventions  Continue to trend platelets, will defer platelet transfusion threshold to daytime PCCM attending physician.        Douglass Dunshee U Juluis Fitzsimmons 04-25-24, 4:05 AM

## 2024-04-26 NOTE — Progress Notes (Addendum)
 STROKE TEAM PROGRESS NOTE    SIGNIFICANT HOSPITAL EVENTS 9/24 patient admitted with fatigue and weakness, noted to have cold left lower extremity and worsening respiratory failure 9/25 left lower extremity fasciotomy and thrombectomy performed.  Patient found to have large right hemispheric infarct on CT.  INTERIM HISTORY/SUBJECTIVE Patient's wife is at the bedside.  I had a long discussion with her about his poor prognosis and she understands and awaits arrival of other family members agrees to DNR Patient continues to require vasopressors to maintain blood pressure within parameters.  Family plans to transition him to comfort care once all family members have had a chance to arrive.  OBJECTIVE  CBC    Component Value Date/Time   WBC 11.4 (H) May 19, 2024 0800   RBC 3.43 (L) 2024-05-19 0800   HGB 10.0 (L) 05/19/24 0800   HGB 12.9 (L) 12/22/2018 1130   HCT 28.1 (L) May 19, 2024 0800   HCT 40.0 12/22/2018 1130   PLT 15 (LL) 2024-05-19 0800   PLT 172 12/22/2018 1130   MCV 81.9 May 19, 2024 0800   MCV 91 12/22/2018 1130   MCH 29.2 2024-05-19 0800   MCHC 35.6 19-May-2024 0800   RDW 15.1 2024-05-19 0800   RDW 14.3 12/22/2018 1130   LYMPHSABS 0.8 04/20/2024 0806   MONOABS 0.2 04/20/2024 0806   EOSABS 0.0 04/20/2024 0806   BASOSABS 0.0 04/20/2024 0806    BMET    Component Value Date/Time   NA 148 (H) 2024/05/19 0812   NA 142 06/15/2017 1603   K 3.6 05/19/24 0812   CL 119 (H) May 19, 2024 0812   CO2 17 (L) 05-19-24 0812   GLUCOSE 143 (H) 19-May-2024 0812   BUN 120 (H) 05/19/24 0812   BUN 12 06/15/2017 1603   CREATININE 3.27 (H) 2024/05/19 0812   CALCIUM  6.7 (L) 2024/05/19 0812   GFRNONAA 20 (L) May 19, 2024 0812    IMAGING past 24 hours MR ANGIO HEAD WO CONTRAST Result Date: May 19, 2024 EXAM: MR Angiography Head without intravenous Contrast. 05/19/24 02:18:00 AM TECHNIQUE: Magnetic resonance angiography images of the head without intravenous contrast. Multiplanar 2D and 3D  reformatted images are provided for review. COMPARISON: None provided. CLINICAL HISTORY: Stroke, follow up. FINDINGS: ANTERIOR CIRCULATION: No significant stenosis of the internal carotid arteries. No significant stenosis of the anterior cerebral arteries. There is abrupt occlusion of the proximal M1 segment of the right middle cerebral artery. No aneurysm. POSTERIOR CIRCULATION: No significant stenosis of the posterior cerebral arteries. No significant stenosis of the basilar artery. No significant stenosis of the vertebral arteries. No aneurysm. IMPRESSION: 1. Abrupt occlusion of the proximal M1 segment of the right middle cerebral artery. Electronically signed by: Evalene Coho MD May 19, 2024 05:37 AM EDT RP Workstation: [id number]   MR BRAIN WO CONTRAST Result Date: 2024-05-19 EXAM: MRI BRAIN WITHOUT CONTRAST May 19, 2024 02:18:00 AM TECHNIQUE: Multiplanar multisequence MRI of the head/brain was performed without the administration of intravenous contrast. COMPARISON: CT of the head dated 04/20/2024. CLINICAL HISTORY: Stroke, follow up. FINDINGS: BRAIN AND VENTRICLES: There is restricted diffusion present throughout the right middle cerebral artery territory distribution. There are also numerous foci of restricted diffusion within the cortex of the left cerebral hemisphere, left cerebellar hemisphere, and right occipital lobe, compatible with embolic infarcts. There is moderate right cerebral swelling with effacement of the sulci and partial effacement of the lateral ventricle. There is approximately 3 mm shift of midline structures to the left. No intracranial hemorrhage. No mass. No hydrocephalus. There is a prominent cisterna magna. The sella is unremarkable. Normal  flow voids. ORBITS: No acute abnormality. SINUSES AND MASTOIDS: No acute abnormality. BONES AND SOFT TISSUES: Normal marrow signal. No acute soft tissue abnormality. IMPRESSION: 1. Acute infarction throughout the right middle cerebral artery  territory. 2. Additional multifocal acute embolic infarcts within the left cerebral hemisphere cortex, left cerebellar hemisphere, and right occipital lobe. 3. Moderate right hemispheric edema with sulcal and partial lateral ventricular effacement, with approximately 3 mm leftward midline shift. Electronically signed by: Evalene Coho MD May 12, 2024 05:34 AM EDT RP Workstation: [id number]    Vitals:   05-12-2024 0900 May 12, 2024 1000 05-12-24 1100 05-12-2024 1200  BP: 107/77 96/68 98/79  (!) 89/71  Pulse:    (!) 144  Resp: 20 18 (!) 30 18  Temp: 99.3 F (37.4 C) 99.7 F (37.6 C) 99.9 F (37.7 C) 100.2 F (37.9 C)  TempSrc:      SpO2:    99%  Weight:      Height:         PHYSICAL EXAM General: Ill-appearing intubated patient in no acute distress, dressings to left lower extremity Psych:  Mood and affect appropriate for situation CV: Regular but tachypneic rate and rhythm on monitor Respiratory: Respirations synchronous with ventilator   NEURO (on low-dose fentanyl ): Patient does not respond to name or regard examiner but will questionably intermittently follow commands on the right side.  Right gaze deviation noted, pupils equal round and reactive to light, no cough and gag, does not blink to threat, some movement of right fingers and toes to command and to noxious, no movement of left upper and lower extremities   ASSESSMENT/PLAN  Mr. BEATRICE SEHGAL is a 63 y.o. male with history of lupus and psoriasis who originally presented with weakness and fatigue and was found to have critical ischemia of the left lower extremity and underwent thrombectomy and fasciotomy in the OR.  He was also found to be septic with positive blood cultures and was noted to have a coronavirus respiratory infection.  Head CT was obtained due to poor mental status, and large right hemispheric infarct was found.  Vegetations were seen on aortic valve, and given patient's overall poor prognosis, decision was made by  family to transition him to comfort care when all family members have had a chance to arrive.  Initial NIH 29  Acute right MCA territory infarct with multifocal acute embolic infarcts in left cerebral hemisphere, left cerebellar hemisphere and right occipital lobe Etiology: Embolic in the setting of endocarditis CT head large subacute infarct in right MCA territory MRI acute infarct in right MCA territory, additional multifocal acute embolic infarcts within left cerebral hemisphere, left cerebellar hemisphere and right occipital lobe with moderate right hemispheric edema with 3 mm midline shift MRA occlusion of proximal right MCA M1 segment 2D Echo EF 35 to 40%, global left ventricular hypokinesis, mild concentric LVH, grade 2 diastolic dysfunction, thickening and calcification of mitral valve, bioprosthetic aortic valve with possible small mobile vegetation on atrial side, normal left atrial size, no atrial level shunt Direct LDL pending HgbA1c 6.1 VTE prophylaxis -SCDs No antithrombotic prior to admission, now on No antithrombotic  Disposition: Comfort care when family arrives  Cerebral edema (brain compression) Cerebral edema with midline shift seen on MRI Hypertonic saline at 75 cc/h Goal sodium 150-155, currently 148  Respiratory failure Patient left intubated after left lower extremity thrombectomy and fasciotomy Ventilator management per CCM  History of hypertension, now hypotensive Home meds: None Unstable, requiring norepinephrine  Keep MAP greater than 65  Hyperlipidemia Home meds:  Atorvastatin  20 mg daily, resumed in hospital Direct LDL pending, goal < 70 Continue statin at discharge  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 Cortney E Everitt Clint Kill , MSN, AGACNP-BC [facility] See Amion for schedule and pager information 28-Apr-2024 1:38 PM   I have personally obtained history,examined this patient, reviewed notes, independently viewed imaging studies, participated in medical decision making and plan of care.ROS completed by me personally and pertinent positives fully documented  I have made any additions or clarifications directly to the above note. Agree with note above.  Patient with sepsis with disseminated MSSA infection and bacterial endocarditis with embolic large right MCA infarct with cytotoxic edema.  Patient's prognosis is quite poor and chances of survival and making meaningful improvement and living independently are quite negligible.  Long discussion with the patient`s wife who understands and has agreed to DNR and is thinking about comfort care measures but wants to wait for other family members to arrive later today.  Discussed with critical care team. This patient is critically ill and at significant risk of neurological worsening, death and care requires constant monitoring of vital signs, hemodynamics,respiratory and cardiac monitoring, extensive review of multiple databases, frequent neurological assessment, discussion with family, other specialists and medical decision making of high complexity.I have made any additions or clarifications directly to the above note.This critical care time does not reflect procedure time, or teaching time or supervisory time of PA/NP/Med Resident etc but could involve care discussion time.  I spent 30 minutes of neurocritical care time  in the  care of  this patient.      Eather Popp, MD Medical Director [facility] Pager: [phone] 28-Apr-2024 2:10 PM   To contact Stroke Continuity provider, please refer to [email or web address]. After hours, contact General Neurology

## 2024-04-26 NOTE — Progress Notes (Addendum)
  Time of Death: Patient was pronounced dead on Nov 26, 2023 at 1544  by two RN Aloysius Forest, and [facility].   On exam, no heart sounds or breath sounds were noted after 1 minute of auscultation. Pupils were fixed and dilated without pupillary light reflex.   Attending, Dr. Kassie was notified.   Family Notified: Family  Luis Ferguson, was present condolences were offered.   Death packet has been completed.  The organ donor network was notified and the case was declined.

## 2024-04-26 DEATH — deceased

## 2024-05-03 LAB — LDL CHOLESTEROL, DIRECT

## 2024-05-04 ENCOUNTER — Ambulatory Visit: Admitting: Internal Medicine

## 2024-05-27 NOTE — Progress Notes (Addendum)
 This Consulting civil engineer received a call on 10/8 at 0840 from [employer] and this RN was informed that the send off test could not be performed.  Luis Ferguson
